# Patient Record
Sex: Male | Born: 1935 | Race: Black or African American | Hispanic: No | Marital: Married | Smoking: Former smoker
Health system: Southern US, Community
[De-identification: ages and names within clinical notes are randomized; demographics above are authoritative.]

## PROBLEM LIST (undated history)

## (undated) ENCOUNTER — Emergency Department: Admission: EM | Payer: PPO | Source: Home / Self Care

## (undated) DIAGNOSIS — I440 Atrioventricular block, first degree: Secondary | ICD-10-CM

## (undated) DIAGNOSIS — E785 Hyperlipidemia, unspecified: Secondary | ICD-10-CM

## (undated) DIAGNOSIS — R001 Bradycardia, unspecified: Secondary | ICD-10-CM

## (undated) DIAGNOSIS — I2510 Atherosclerotic heart disease of native coronary artery without angina pectoris: Secondary | ICD-10-CM

## (undated) DIAGNOSIS — I4891 Unspecified atrial fibrillation: Secondary | ICD-10-CM

## (undated) DIAGNOSIS — R42 Dizziness and giddiness: Secondary | ICD-10-CM

## (undated) DIAGNOSIS — I10 Essential (primary) hypertension: Secondary | ICD-10-CM

## (undated) HISTORY — DX: Hyperlipidemia, unspecified: E78.5

## (undated) HISTORY — DX: Bradycardia, unspecified: R00.1

## (undated) HISTORY — DX: Unspecified atrial fibrillation: I48.91

## (undated) HISTORY — DX: Essential (primary) hypertension: I10

## (undated) HISTORY — DX: Dizziness and giddiness: R42

## (undated) HISTORY — DX: Atherosclerotic heart disease of native coronary artery without angina pectoris: I25.10

## (undated) HISTORY — DX: Atrioventricular block, first degree: I44.0

---

## 2003-01-06 HISTORY — PX: SHOULDER SURGERY: SHX246

## 2003-11-20 ENCOUNTER — Emergency Department: Payer: Self-pay | Admitting: Emergency Medicine

## 2004-03-31 ENCOUNTER — Ambulatory Visit: Payer: Self-pay | Admitting: Orthopaedic Surgery

## 2004-04-02 ENCOUNTER — Ambulatory Visit: Payer: Self-pay | Admitting: Orthopaedic Surgery

## 2004-04-04 ENCOUNTER — Ambulatory Visit: Payer: Self-pay | Admitting: Orthopaedic Surgery

## 2005-05-14 ENCOUNTER — Ambulatory Visit: Payer: Self-pay | Admitting: Internal Medicine

## 2005-05-29 ENCOUNTER — Ambulatory Visit: Payer: Self-pay | Admitting: Internal Medicine

## 2005-07-06 ENCOUNTER — Ambulatory Visit: Payer: Self-pay | Admitting: Cardiology

## 2006-01-05 HISTORY — PX: CHOLECYSTECTOMY: SHX55

## 2009-12-26 ENCOUNTER — Emergency Department: Payer: Self-pay

## 2012-05-12 DIAGNOSIS — R001 Bradycardia, unspecified: Secondary | ICD-10-CM

## 2012-05-12 DIAGNOSIS — R42 Dizziness and giddiness: Secondary | ICD-10-CM

## 2012-05-12 DIAGNOSIS — I10 Essential (primary) hypertension: Secondary | ICD-10-CM

## 2012-05-12 DIAGNOSIS — Z9049 Acquired absence of other specified parts of digestive tract: Secondary | ICD-10-CM | POA: Insufficient documentation

## 2012-05-12 DIAGNOSIS — E785 Hyperlipidemia, unspecified: Secondary | ICD-10-CM | POA: Insufficient documentation

## 2012-05-12 HISTORY — DX: Bradycardia, unspecified: R00.1

## 2012-05-12 HISTORY — DX: Dizziness and giddiness: R42

## 2012-05-12 HISTORY — DX: Essential (primary) hypertension: I10

## 2012-05-13 DIAGNOSIS — I440 Atrioventricular block, first degree: Secondary | ICD-10-CM

## 2012-05-13 HISTORY — DX: Atrioventricular block, first degree: I44.0

## 2012-05-24 ENCOUNTER — Ambulatory Visit: Payer: Self-pay | Admitting: Internal Medicine

## 2013-06-20 ENCOUNTER — Ambulatory Visit: Payer: Self-pay | Admitting: Orthopedic Surgery

## 2014-11-12 ENCOUNTER — Encounter: Payer: Self-pay | Admitting: *Deleted

## 2014-11-12 DIAGNOSIS — I2510 Atherosclerotic heart disease of native coronary artery without angina pectoris: Secondary | ICD-10-CM

## 2014-11-12 HISTORY — DX: Atherosclerotic heart disease of native coronary artery without angina pectoris: I25.10

## 2014-11-13 ENCOUNTER — Encounter: Payer: Self-pay | Admitting: Obstetrics and Gynecology

## 2014-11-13 ENCOUNTER — Ambulatory Visit (INDEPENDENT_AMBULATORY_CARE_PROVIDER_SITE_OTHER): Payer: Medicare Other | Admitting: Obstetrics and Gynecology

## 2014-11-13 VITALS — BP 163/80 | HR 74 | Resp 16 | Ht 72.0 in | Wt 246.9 lb

## 2014-11-13 DIAGNOSIS — R310 Gross hematuria: Secondary | ICD-10-CM | POA: Diagnosis not present

## 2014-11-13 LAB — URINALYSIS, COMPLETE
BILIRUBIN UA: NEGATIVE
GLUCOSE, UA: NEGATIVE
Ketones, UA: NEGATIVE
LEUKOCYTES UA: NEGATIVE
Nitrite, UA: NEGATIVE
Specific Gravity, UA: 1.025 (ref 1.005–1.030)
Urobilinogen, Ur: 0.2 mg/dL (ref 0.2–1.0)
pH, UA: 5.5 (ref 5.0–7.5)

## 2014-11-13 LAB — MICROSCOPIC EXAMINATION: Epithelial Cells (non renal): NONE SEEN /hpf (ref 0–10)

## 2014-11-13 NOTE — Progress Notes (Signed)
11/13/2014 1:43 PM   Jacob Avila 1927/04/12 [id number]  Referring provider: No referring provider defined for this encounter.  Chief Complaint  Patient presents with  . Hematuria  . Establish Care    HPI: Patient is an 81yo male presenting today as a referral for gross hematuria occuring 2 weeks ago.  He was seen by his PCP who noted TNTC RBCs on UA with negative urine culture.  He denies any pain, fevers, chills, or change in urinary symptoms. No penile or scrotal pain. No histyr of renal stones.  Former smoker quit in 1980s 2ppd x 40 years.    PMH: Past Medical History  Diagnosis Date  . HLD (hyperlipidemia)     Surgical History: Past Surgical History  Procedure Laterality Date  . Cholecystectomy  2008  . Shoulder surgery  2005    Home Medications:    Medication List       This list is accurate as of: 11/13/14  1:43 PM.  Always use your most recent med list.               aspirin EC 81 MG tablet  Take 81 mg by mouth.     simvastatin 40 MG tablet  Commonly known as:  ZOCOR  Take 40 mg by mouth.        Allergies: No Known Allergies  Family History: History reviewed. No pertinent family history.  Social History:  reports that he has quit smoking. His smoking use included Cigarettes. He does not have any smokeless tobacco history on file. His alcohol and drug histories are not on file.  ROS: UROLOGY Frequent Urination?: No Hard to postpone urination?: No Burning/pain with urination?: No Get up at night to urinate?: No Leakage of urine?: No Urine stream starts and stops?: No Trouble starting stream?: No Do you have to strain to urinate?: No Blood in urine?: Yes Urinary tract infection?: No Sexually transmitted disease?: No Injury to kidneys or bladder?: No Painful intercourse?: No Weak stream?: No Erection problems?: No Penile pain?: No  Gastrointestinal Nausea?: No Vomiting?: No Indigestion/heartburn?: No Diarrhea?:  No Constipation?: No  Constitutional Fever: No Night sweats?: No Weight loss?: No Fatigue?: No  Skin Skin rash/lesions?: No Itching?: No  Eyes Blurred vision?: No Double vision?: No  Ears/Nose/Throat Sore throat?: No Sinus problems?: No  Hematologic/Lymphatic Swollen glands?: No Easy bruising?: No  Cardiovascular Leg swelling?: No Chest pain?: No  Respiratory Cough?: No Shortness of breath?: No  Endocrine Excessive thirst?: No  Musculoskeletal Back pain?: No Joint pain?: No  Neurological Headaches?: No Dizziness?: No  Psychologic Depression?: No Anxiety?: No  Physical Exam: BP 163/80 mmHg  Pulse 74  Resp 16  Ht 6' (1.829 m)  Wt 246 lb 14.4 oz (111.993 kg)  BMI 33.48 kg/m2  Constitutional:  Alert and oriented, No acute distress. HEENT: [place] AT, moist mucus membranes.  Trachea midline, no masses. Cardiovascular: No clubbing, cyanosis, or edema. Respiratory: Normal respiratory effort, no increased work of breathing. GI: Abdomen is soft, nontender, nondistended, no abdominal masses GU: No CVA tenderness.  Uncircumcised pahllus, testicles descended bilaterally, nontender, small bilateral hydrocele Skin: No rashes, bruises or suspicious lesions. Lymph: No cervical or inguinal adenopathy. Neurologic: Grossly intact, no focal deficits, moving all 4 extremities. Psychiatric: Normal mood and affect.  Laboratory Data:   Urinalysis    Component Value Date/Time   GLUCOSEU Negative 11/13/2014 1128   BILIRUBINUR Negative 11/13/2014 1128   NITRITE Negative 11/13/2014 1128   LEUKOCYTESUR Negative 11/13/2014 1128  Pertinent Imaging:   Assessment & Plan:  81yo male with asymptomatic gross hematuria.  Remote heavy smoking history.  1. Gross hematuria- We discussed the differential diagnosis for  hematuria including nephrolithiasis, renal or upper tract tumors, bladder stones, UTIs, or bladder tumors as well as undetermined etiologies. Per [facility]  guidelines, I did recommend complete hematuria evaluation including CTU, possible urine cytology, and office cystoscopy. -CMP - Urinalysis, Complete - CULTURE, URINE COMPREHENSIVE   Return for CT Urogram results; cystoscopy .  These notes generated with voice recognition software. I apologize for typographical errors.  Herbert Moors, [facility] [address] [place], [place] [address] [phone]

## 2014-11-14 LAB — COMPREHENSIVE METABOLIC PANEL
A/G RATIO: 1.2 (ref 1.1–2.5)
ALBUMIN: 4.2 g/dL (ref 3.5–4.7)
ALT: 7 IU/L (ref 0–44)
AST: 19 IU/L (ref 0–40)
Alkaline Phosphatase: 101 IU/L (ref 39–117)
BUN/Creatinine Ratio: 18 (ref 10–22)
BUN: 17 mg/dL (ref 8–27)
Bilirubin Total: 0.3 mg/dL (ref 0.0–1.2)
CALCIUM: 9.5 mg/dL (ref 8.6–10.2)
CO2: 18 mmol/L (ref 18–29)
Chloride: 103 mmol/L (ref 97–106)
Creatinine, Ser: 0.92 mg/dL (ref 0.76–1.27)
GFR calc Af Amer: 88 mL/min/{1.73_m2} (ref >59)
GFR calc non Af Amer: 76 mL/min/{1.73_m2} (ref >59)
GLOBULIN, TOTAL: 3.4 g/dL (ref 1.5–4.5)
Glucose: 93 mg/dL (ref 65–99)
POTASSIUM: 4.7 mmol/L (ref 3.5–5.2)
Sodium: 144 mmol/L (ref 136–144)
Total Protein: 7.6 g/dL (ref 6.0–8.5)

## 2014-11-15 LAB — CULTURE, URINE COMPREHENSIVE

## 2014-11-20 ENCOUNTER — Ambulatory Visit
Admission: RE | Admit: 2014-11-20 | Discharge: 2014-11-20 | Disposition: A | Discharge status: Home / Self Care | Payer: Medicare Other | Source: Ambulatory Visit | Attending: Obstetrics and Gynecology | Admitting: Obstetrics and Gynecology

## 2014-11-20 DIAGNOSIS — D3502 Benign neoplasm of left adrenal gland: Secondary | ICD-10-CM | POA: Diagnosis not present

## 2014-11-20 DIAGNOSIS — R310 Gross hematuria: Secondary | ICD-10-CM

## 2014-11-20 DIAGNOSIS — N281 Cyst of kidney, acquired: Secondary | ICD-10-CM | POA: Diagnosis not present

## 2014-11-20 DIAGNOSIS — N400 Enlarged prostate without lower urinary tract symptoms: Secondary | ICD-10-CM | POA: Insufficient documentation

## 2014-11-20 MED ORDER — IOHEXOL 300 MG/ML  SOLN
125.0000 mL | Freq: Once | INTRAMUSCULAR | Status: AC | PRN
  Administered 2014-11-20: 125 mL via INTRAVENOUS

## 2014-11-21 ENCOUNTER — Ambulatory Visit (INDEPENDENT_AMBULATORY_CARE_PROVIDER_SITE_OTHER): Payer: Medicare Other | Admitting: Urology

## 2014-11-21 ENCOUNTER — Encounter: Payer: Self-pay | Admitting: Urology

## 2014-11-21 VITALS — BP 160/101 | HR 76 | Ht 72.0 in | Wt 245.9 lb

## 2014-11-21 DIAGNOSIS — R310 Gross hematuria: Secondary | ICD-10-CM | POA: Diagnosis not present

## 2014-11-21 DIAGNOSIS — Q6100 Congenital renal cyst, unspecified: Secondary | ICD-10-CM

## 2014-11-21 DIAGNOSIS — R339 Retention of urine, unspecified: Secondary | ICD-10-CM | POA: Diagnosis not present

## 2014-11-21 DIAGNOSIS — D3502 Benign neoplasm of left adrenal gland: Secondary | ICD-10-CM

## 2014-11-21 DIAGNOSIS — N401 Enlarged prostate with lower urinary tract symptoms: Secondary | ICD-10-CM

## 2014-11-21 DIAGNOSIS — N281 Cyst of kidney, acquired: Secondary | ICD-10-CM

## 2014-11-21 LAB — URINALYSIS, COMPLETE
BILIRUBIN UA: NEGATIVE
GLUCOSE, UA: NEGATIVE
KETONES UA: NEGATIVE
Leukocytes, UA: NEGATIVE
NITRITE UA: NEGATIVE
SPEC GRAV UA: 1.025 (ref 1.005–1.030)
UUROB: 0.2 mg/dL (ref 0.2–1.0)
pH, UA: 5.5 (ref 5.0–7.5)

## 2014-11-21 LAB — MICROSCOPIC EXAMINATION: Epithelial Cells (non renal): NONE SEEN /hpf (ref 0–10)

## 2014-11-21 LAB — BLADDER SCAN AMB NON-IMAGING: SCAN RESULT: 187

## 2014-11-21 MED ORDER — LIDOCAINE HCL 2 % EX GEL
1.0000 "application " | Freq: Once | CUTANEOUS | Status: AC
  Administered 2014-11-21: 1 via URETHRAL

## 2014-11-21 MED ORDER — CIPROFLOXACIN HCL 500 MG PO TABS
500.0000 mg | ORAL_TABLET | Freq: Once | ORAL | Status: AC
  Administered 2014-11-21: 500 mg via ORAL

## 2014-11-21 NOTE — Progress Notes (Signed)
4:18 PM  11/21/2014    Jacob Avila June 18, 1927 [id number]  Referring provider: Albina Billet, MD [address]   [place], [place] [address]  Chief Complaint  Patient presents with  . Cysto    gross hematuria    HPI: Patient is an 81yo male presenting today as a referral for gross hematuria occuring 2 weeks ago.  He was seen by his PCP who noted TNTC RBCs on UA with negative urine culture.  He denies any pain, fevers, chills, or change in urinary symptoms. No penile or scrotal pain. No history of renal stones.   He has minimal voiding complaints and denies urinary urgency, frequency, nocturia, or incontinence.   He does report that on occasion , he does have to void a second time to fully empty his bladder.  Former smoker quit in 1980s 2ppd x 40 years.   He returns to the office today to discuss his CT urogram results of her office cystoscopy.    CT urogram performed yesterday shows bilateral simple renal cysts, small right hyperdense cyst , an incidental left adrenal adenoma as well as massively enlarged prostate. There is no other obvious pathology.  PMH: Past Medical History  Diagnosis Date  . HLD (hyperlipidemia)   . Arteriosclerosis of coronary artery 11/12/2014  . 1St degree AV block 05/13/2012  . BP (high blood pressure) 05/12/2012  . Bradycardia 05/12/2012  . Dizziness 05/12/2012    Surgical History: Past Surgical History  Procedure Laterality Date  . Cholecystectomy  2008  . Shoulder surgery  2005    Home Medications:    Medication List       This list is accurate as of: 11/21/14  4:18 PM.  Always use your most recent med list.               aspirin EC 81 MG tablet  Take 81 mg by mouth.     simvastatin 40 MG tablet  Commonly known as:  ZOCOR  Take 40 mg by mouth.        Allergies: No Known Allergies  Family History: History reviewed. No pertinent family history.  Social History:  reports that he quit smoking about 36 years ago. His  smoking use included Cigarettes. He does not have any smokeless tobacco history on file. He reports that he does not drink alcohol or use illicit drugs.  Physical Exam: BP 160/101 mmHg  Pulse 76  Ht 6' (1.829 m)  Wt 245 lb 14.4 oz (111.54 kg)  BMI 33.34 kg/m2  Constitutional:  Alert and oriented, No acute distress.   Appears younger than stated age. HEENT: [place] AT, moist mucus membranes.  Trachea midline, no masses. Cardiovascular: No clubbing, cyanosis, or edema. Respiratory: Normal respiratory effort, no increased work of breathing. GI: Abdomen is soft, nontender, nondistended, no abdominal masses GU:  Uncircumcised phallus with pain and urethral meatus. Skin: No rashes, bruises or suspicious lesions. Neurologic: Grossly intact, no focal deficits, moving all 4 extremities. Psychiatric: Normal mood and affect.  Laboratory Data: Urinalysis UA negative today for any   Pertinent Imaging: CLINICAL DATA: Gross hematuria for 1 month.  EXAM: CT ABDOMEN AND PELVIS WITHOUT AND WITH CONTRAST  TECHNIQUE: Multidetector CT imaging of the abdomen and pelvis was performed following the standard protocol before and following the bolus administration of intravenous contrast.  CONTRAST: 142mL OMNIPAQUE IOHEXOL 300 MG/ML SOLN  COMPARISON: 12/26/2009  FINDINGS: Lower chest: Lung bases are clear.  Hepatobiliary: Several low-density lesions in the liver simple fluid  attenuation consists with benign hepatic cysts. Post cholecystectomy.  Pancreas: Pancreas is normal. No ductal dilatation. No pancreatic inflammation.  Spleen: Normal spleen  Adrenals/urinary tract: 18 mm nodule of the LEFT adrenal gland has low attenuation consistent with a benign adenoma. RIGHT adrenal glands normal.  Non IV contrast images demonstrate no nephrolithiasis or ureterolithiasis. Cortical phase imaging demonstrates bilateral nonenhancing low-density renal cysts. One small 7 mm  high-density lesion in the lower pole of the RIGHT kidney (image 44, series 2). Delayed pyelogram phase imaging demonstrates no filling defects within the collecting systems or ureters.  Stomach/Bowel: Stomach, small bowel, appendix, and cecum are normal. The colon and rectosigmoid colon are normal.  Vascular/Lymphatic: Abdominal aorta is normal caliber with atherosclerotic calcification. There is no retroperitoneal or periportal lymphadenopathy. No pelvic lymphadenopathy.  Reproductive: Prostate gland is enlarged measuring 81 mm by 69 in axial dimension and 93 mm in craniocaudad dimension.  Other: No free fluid.  Musculoskeletal: No aggressive osseous lesion.  IMPRESSION: 1. No explanation for hematuria. No nephrolithiasis, ureterolithiasis, enhancing renal cortical lesion, or filling defects within the collecting systems. 2. Bilateral Bosniak 1 renal cysts. 3. Small hyperdense lesion in the RIGHT kidney is too small to characterize but likely represents a hemorrhagic cyst. 4. No bladder stones or filling defects in the bladder which does not excluded a bladder lesion. 5. Incidental benign LEFT adrenal adenoma. 6. Marked prostate hypertrophy.   Electronically Signed  By: Suzy Bouchard M.D.  On: 11/20/2014 09:18   Cystoscopy Procedure Note  Patient identification was confirmed, informed consent was obtained, and patient was prepped using Betadine solution.  Lidocaine jelly was administered per urethral meatus.    Preoperative abx where received prior to procedure.     Pre-Procedure: - Inspection reveals a normal caliber ureteral meatus.  Procedure: The flexible cystoscope was introduced without difficulty - No urethral strictures/lesions are present. - Enlarged prostate  With trilobar coaptation and irregular contour ( left lateral lobe greater than right), ~8 cm length., friable mucosa - Elevated bladder neck - Bilateral ureteral orifices  identified - Bladder mucosa  reveals no ulcers, tumors, or lesions - No bladder stones - Mild trabeculation  Retroflexion shows massive intravesical protrusion of prostate with mass effect into the bladder, ball valve appearance   Post-Procedure: - Patient tolerated the procedure well  PVR today 180 cc   Assessment & Plan:  81yo male with asymptomatic gross hematuria, remote heavy smoking history.   CT  Urogram has several incidental findings of minimal significance but does show a massively enlarged prostate gland. Cystoscopy today confirms this but no evidence of bladder tumors.  Relatively few voiding symptoms but does have elevated PVR today.    1. Gross hematuria As above - Urinalysis, Complete - ciprofloxacin (CIPRO) tablet 500 mg; Take 1 tablet (500 mg total) by mouth once. - lidocaine (XYLOCAINE) 2 % jelly 1 application; Place 1 application into the urethra once.  2. Adrenal adenoma, left Incidental   3. Renal cyst Simple renal cyst and small indeterminate hy  4. Enlarged prostate with lower urinary tract symptoms (LUTS) Asymptomatic.  See below.  5. Incomplete bladder emptying  Slightly elevated PVR to 180 today but no sequela including no history of urinary tract infections, renal failure, or bladder stones. As such, we'll continue to monitor his symptoms and postvoid residual.  Return in about 3 months (around 02/21/2015) for PVR, IPSS .   Hollice Espy, MD  [facility] [address], [place], [place] [address] [phone]

## 2014-11-21 NOTE — Progress Notes (Signed)
Bladder Scan Patient  Void: 173ml Performed By: Larna Daughters

## 2014-11-27 ENCOUNTER — Other Ambulatory Visit: Payer: Medicare Other

## 2015-02-22 ENCOUNTER — Encounter: Payer: Self-pay | Admitting: Urology

## 2015-02-22 ENCOUNTER — Ambulatory Visit (INDEPENDENT_AMBULATORY_CARE_PROVIDER_SITE_OTHER): Payer: Medicare Other | Admitting: Urology

## 2015-02-22 VITALS — BP 197/81 | HR 67 | Ht 72.0 in | Wt 246.6 lb

## 2015-02-22 DIAGNOSIS — R310 Gross hematuria: Secondary | ICD-10-CM | POA: Diagnosis not present

## 2015-02-22 DIAGNOSIS — R339 Retention of urine, unspecified: Secondary | ICD-10-CM

## 2015-02-22 DIAGNOSIS — N400 Enlarged prostate without lower urinary tract symptoms: Secondary | ICD-10-CM | POA: Diagnosis not present

## 2015-02-22 LAB — MICROSCOPIC EXAMINATION: EPITHELIAL CELLS (NON RENAL): NONE SEEN /HPF (ref 0–10)

## 2015-02-22 LAB — URINALYSIS, COMPLETE
Bilirubin, UA: NEGATIVE
GLUCOSE, UA: NEGATIVE
Ketones, UA: NEGATIVE
Leukocytes, UA: NEGATIVE
NITRITE UA: NEGATIVE
PH UA: 6 (ref 5.0–7.5)
RBC, UA: NEGATIVE
Specific Gravity, UA: 1.025 (ref 1.005–1.030)
UUROB: 1 mg/dL (ref 0.2–1.0)

## 2015-02-22 LAB — BLADDER SCAN AMB NON-IMAGING

## 2015-02-22 NOTE — Progress Notes (Signed)
11:39 AM  02/22/2015    Jacob Avila 09/27/1927 [id number]  Referring provider: Albina Billet, MD [address]   [place], [place] [address]  Chief Complaint  Patient presents with  . Benign Prostatic Hypertrophy    51months    HPI: 82 yo M male who returns today for routine follow up.  Hematuria No further epidoses.   S/ p work up including CT urogram 11/2014 hows bilateral simple renal cysts, small right hyperdense cyst , an incidental left adrenal adenoma as well as massively enlarged prostate.  Follow up ysto showed bilobar coaptation and irregular contour ( left lateral lobe greater than right), ~8 cm length., friable mucosa and median lobe.    Former smoker quit in 1980s 2ppd x 40 years.   Massive BPH/ incomplete bladder emtpying Massively enlarged prostate on cystoscopy, median lobe History of incomplete bladder emptying, previous PVR 185 cc, today 83 cc No history of urinary retention/ stones/  Cr 0.92 No voiding complaints today, not on any BPH meds IPSS as below      IPSS      02/22/15 0900       International Prostate Symptom Score   How often have you had the sensation of not emptying your bladder? Less than 1 in 5     How often have you had to urinate less than every two hours? Less than half the time     How often have you found you stopped and started again several times when you urinated? Less than 1 in 5 times     How often have you found it difficult to postpone urination? Not at All     How often have you had a weak urinary stream? Less than 1 in 5 times     How often have you had to strain to start urination? Not at All     How many times did you typically get up at night to urinate? 1 Time     Total IPSS Score 6     Quality of Life due to urinary symptoms   If you were to spend the rest of your life with your urinary condition just the way it is now how would you feel about that? Pleased        Score:  1-7 Mild 8-19 Moderate 20-35  Severe    PMH: Past Medical History  Diagnosis Date  . HLD (hyperlipidemia)   . Arteriosclerosis of coronary artery 11/12/2014  . 1St degree AV block 05/13/2012  . BP (high blood pressure) 05/12/2012  . Bradycardia 05/12/2012  . Dizziness 05/12/2012    Surgical History: Past Surgical History  Procedure Laterality Date  . Cholecystectomy  2008  . Shoulder surgery  2005    Home Medications:    Medication List       This list is accurate as of: 02/22/15 11:39 AM.  Always use your most recent med list.               aspirin EC 81 MG tablet  Take 81 mg by mouth.     simvastatin 40 MG tablet  Commonly known as:  ZOCOR  Take 40 mg by mouth.        Allergies: No Known Allergies  Family History: History reviewed. No pertinent family history.  Social History:  reports that he quit smoking about 37 years ago. His smoking use included Cigarettes. He does not have any smokeless tobacco history on file. He reports that he  does not drink alcohol or use illicit drugs.  Physical Exam: BP 197/81 mmHg  Pulse 67  Ht 6' (1.829 m)  Wt 246 lb 9.6 oz (111.857 kg)  BMI 33.44 kg/m2  Constitutional:  Alert and oriented, No acute distress.   Appears younger than stated age. HEENT: [place] AT, moist mucus membranes.  Trachea midline, no masses. Cardiovascular: No clubbing, cyanosis, or edema. Respiratory: Normal respiratory effort, no increased work of breathing. GI: Abdomen is soft, nontender, nondistended, no abdominal masses.  No suprapubic tenderness. Neurologic: Grossly intact, no focal deficits, moving all 4 extremities. Psychiatric: Normal mood and affect.  Laboratory Data:   Lab Results  Component Value Date   CREATININE 0.92 11/13/2014    Urinalysis UA negative today, see epic  Results for orders placed or performed in visit on 02/22/15  BLADDER SCAN AMB NON-IMAGING  Result Value Ref Range   Scan Result 41ml     Assessment & Plan:    1. Gross hematuria Resolved, work  up consistent with massive BPH, friable mucosa - Urinalysis, Complete  2. BPH (benign prostatic hyperplasia) Asymptomatic on no meds No sequela PVR today improved, Cr normal Risk for retention, offered finasteride but not interested Will continue to monitor - BLADDER SCAN AMB NON-IMAGING  3. Incomplete bladder emptying As above   Return in about 1 year (around 02/22/2016) for PVR.   Hollice Espy, MD  [facility] [address], [place], [place] [address] [phone]

## 2016-02-25 ENCOUNTER — Ambulatory Visit: Payer: Medicare Other | Admitting: Urology

## 2016-02-25 NOTE — Progress Notes (Deleted)
9:31 AM  02/25/16    Kearney BRIGGSTON STIGGERS 1927/05/12 [id number]  Referring provider: Albina Billet, MD [address]   [place], [place] [address]  No chief complaint on file.   HPI: 83 yo M male who returns today for yearly follow up.  Hematuria No further epidoses.  S/ p work up including CT urogram 11/2014 hows bilateral simple renal cysts, small right hyperdense cyst , an incidental left adrenal adenoma as well as massively enlarged prostate.  Follow up ysto showed bilobar coaptation and irregular contour ( left lateral lobe greater than right), ~8 cm length., friable mucosa and median lobe.    Former smoker quit in 1980s 2ppd x 40 years.   Massive BPH/ incomplete bladder emtpying Massively enlarged prostate on cystoscopy, median lobe History of incomplete bladder emptying, previous PVR 83 cc, today *** cc No history of urinary retention/ stones/  Cr 0.92 No voiding complaints today, not on any BPH meds IPSS as below    Score:  1-7 Mild 8-19 Moderate 20-35 Severe    PMH: Past Medical History:  Diagnosis Date  . 1St degree AV block 05/13/2012  . Arteriosclerosis of coronary artery 11/12/2014  . BP (high blood pressure) 05/12/2012  . Bradycardia 05/12/2012  . Dizziness 05/12/2012  . HLD (hyperlipidemia)     Surgical History: Past Surgical History:  Procedure Laterality Date  . CHOLECYSTECTOMY  2008  . SHOULDER SURGERY  2005    Home Medications:  Allergies as of 02/25/2016   No Known Allergies     Medication List       Accurate as of 02/25/16  9:31 AM. Always use your most recent med list.          aspirin EC 81 MG tablet Take 81 mg by mouth.   simvastatin 40 MG tablet Commonly known as:  ZOCOR Take 40 mg by mouth.       Allergies: No Known Allergies  Family History: No family history on file.  Social History:  reports that he quit smoking about 38 years ago. His smoking use included Cigarettes. He does not have any smokeless tobacco history  on file. He reports that he does not drink alcohol or use drugs.  Physical Exam: There were no vitals taken for this visit.  Constitutional:  Alert and oriented, No acute distress.   Appears younger than stated age. HEENT: [place] AT, moist mucus membranes.  Trachea midline, no masses. Cardiovascular: No clubbing, cyanosis, or edema. Respiratory: Normal respiratory effort, no increased work of breathing. GI: Abdomen is soft, nontender, nondistended, no abdominal masses.  No suprapubic tenderness. Neurologic: Grossly intact, no focal deficits, moving all 4 extremities. Psychiatric: Normal mood and affect.  Laboratory Data:   Lab Results  Component Value Date   CREATININE 0.92 11/13/2014    Urinalysis UA negative today, see epic  Results for orders placed or performed in visit on 02/22/15  Microscopic Examination  Result Value Ref Range   WBC, UA 0-5 0 - 5 /hpf   RBC, UA 0-2 0 - 2 /hpf   Epithelial Cells (non renal) None seen 0 - 10 /hpf   Mucus, UA Present (A) Not Estab.   Bacteria, UA Few (A) None seen/Few  Urinalysis, Complete  Result Value Ref Range   Specific Gravity, UA 1.025 1.005 - 1.030   pH, UA 6.0 5.0 - 7.5   Color, UA Yellow Yellow   Appearance Ur Clear Clear   Leukocytes, UA Negative Negative   Protein, UA 2+ (A)  Negative/Trace   Glucose, UA Negative Negative   Ketones, UA Negative Negative   RBC, UA Negative Negative   Bilirubin, UA Negative Negative   Urobilinogen, Ur 1.0 0.2 - 1.0 mg/dL   Nitrite, UA Negative Negative   Microscopic Examination See below:   BLADDER SCAN AMB NON-IMAGING  Result Value Ref Range   Scan Result 28ml     Assessment & Plan:    1. Gross hematuria Resolved, work up consistent with massive BPH, friable mucosa - Urinalysis, Complete  2. BPH (benign prostatic hyperplasia) Asymptomatic on no meds No sequela PVR today improved, Cr normal Risk for retention, offered finasteride but not interested Will continue to monitor -  BLADDER SCAN AMB NON-IMAGING  3. Incomplete bladder emptying As above   No Follow-up on file.   Zara Council, [facility] [address], [place], [place] [address] [phone]

## 2016-10-23 DIAGNOSIS — R0609 Other forms of dyspnea: Secondary | ICD-10-CM

## 2016-11-03 DIAGNOSIS — I509 Heart failure, unspecified: Secondary | ICD-10-CM | POA: Insufficient documentation

## 2016-11-30 ENCOUNTER — Encounter: Payer: Medicare Other | Attending: Cardiovascular Disease | Admitting: *Deleted

## 2016-11-30 ENCOUNTER — Encounter: Payer: Self-pay | Admitting: *Deleted

## 2016-11-30 VITALS — Ht 72.5 in | Wt 221.0 lb

## 2016-11-30 DIAGNOSIS — I4891 Unspecified atrial fibrillation: Secondary | ICD-10-CM

## 2016-11-30 DIAGNOSIS — I2510 Atherosclerotic heart disease of native coronary artery without angina pectoris: Secondary | ICD-10-CM | POA: Diagnosis not present

## 2016-11-30 DIAGNOSIS — Z7982 Long term (current) use of aspirin: Secondary | ICD-10-CM | POA: Insufficient documentation

## 2016-11-30 DIAGNOSIS — Z79899 Other long term (current) drug therapy: Secondary | ICD-10-CM | POA: Insufficient documentation

## 2016-11-30 DIAGNOSIS — I440 Atrioventricular block, first degree: Secondary | ICD-10-CM | POA: Diagnosis not present

## 2016-11-30 DIAGNOSIS — Z7902 Long term (current) use of antithrombotics/antiplatelets: Secondary | ICD-10-CM | POA: Insufficient documentation

## 2016-11-30 DIAGNOSIS — E785 Hyperlipidemia, unspecified: Secondary | ICD-10-CM | POA: Insufficient documentation

## 2016-11-30 DIAGNOSIS — I10 Essential (primary) hypertension: Secondary | ICD-10-CM | POA: Insufficient documentation

## 2016-11-30 DIAGNOSIS — Z87891 Personal history of nicotine dependence: Secondary | ICD-10-CM | POA: Insufficient documentation

## 2016-11-30 DIAGNOSIS — Z955 Presence of coronary angioplasty implant and graft: Secondary | ICD-10-CM | POA: Diagnosis not present

## 2016-11-30 HISTORY — DX: Unspecified atrial fibrillation: I48.91

## 2016-11-30 NOTE — Progress Notes (Signed)
Cardiac Individual Treatment Plan  Patient Details  Name: Jacob Avila MRN: [id number] Date of Birth: March 10, 1927 Referring Provider:     Cardiac Rehab from 11/30/2016 in [facility] Cardiac and Pulmonary Rehab  Referring Provider  Mora Appl MD      Initial Encounter Date:    Cardiac Rehab from 11/30/2016 in [facility] Cardiac and Pulmonary Rehab  Date  11/30/16  Referring Provider  Mora Appl MD      Visit Diagnosis: Status post coronary artery stent placement  Patient's Home Medications on Admission:  Current Outpatient Medications:  .  atorvastatin (LIPITOR) 80 MG tablet, Take 80 mg by mouth., Disp: , Rfl:  .  clopidogrel (PLAVIX) 75 MG tablet, Take 75 mg by mouth., Disp: , Rfl:  .  furosemide (LASIX) 20 MG tablet, Take 40 mg by mouth., Disp: , Rfl:  .  lisinopril (PRINIVIL,ZESTRIL) 2.5 MG tablet, Take 2.5 mg by mouth., Disp: , Rfl:  .  metoprolol succinate (TOPROL-XL) 50 MG 24 hr tablet, Take 50 mg by mouth., Disp: , Rfl:  .  omeprazole (PRILOSEC) 20 MG capsule, Take 20 mg by mouth., Disp: , Rfl:  .  aspirin EC 81 MG tablet, Take 81 mg by mouth., Disp: , Rfl:  .  simvastatin (ZOCOR) 40 MG tablet, Take 40 mg by mouth., Disp: , Rfl:   Past Medical History: Past Medical History:  Diagnosis Date  . 1st degree AV block 05/13/2012  . Arteriosclerosis of coronary artery 11/12/2014  . Atrial fibrillation ([place]) 11/30/2016  . BP (high blood pressure) 05/12/2012  . Bradycardia 05/12/2012  . Dizziness 05/12/2012  . HLD (hyperlipidemia)     Tobacco Use: Social History   Tobacco Use  Smoking Status Former Smoker  . Types: Cigarettes  . Last attempt to quit: 01/05/1978  . Years since quitting: 38.9  Smokeless Tobacco Never Used    Labs: Recent Review Flowsheet Data    There is no flowsheet data to display.       Exercise Target Goals: Date: 11/30/16  Exercise Program Goal: Individual exercise prescription set with THRR, safety & activity barriers. Participant  demonstrates ability to understand and report RPE using BORG scale, to self-measure pulse accurately, and to acknowledge the importance of the exercise prescription.  Exercise Prescription Goal: Starting with aerobic activity 30 plus minutes a day, 3 days per week for initial exercise prescription. Provide home exercise prescription and guidelines that participant acknowledges understanding prior to discharge.  Activity Barriers & Risk Stratification: Activity Barriers & Cardiac Risk Stratification - 11/30/16 1413      Activity Barriers & Cardiac Risk Stratification   Activity Barriers  Shortness of Breath;Deconditioning;Muscular Weakness;Balance Concerns    Cardiac Risk Stratification  Moderate       6 Minute Walk: 6 Minute Walk    Row Name 11/30/16 1452         6 Minute Walk   Phase  Initial     Distance  1265 feet     Walk Time  6 minutes     # of Rest Breaks  0     MPH  2.4     METS  2.74     RPE  13     Perceived Dyspnea   1     VO2 Peak  9.6     Symptoms  Yes (comment)     Comments  SOB     Resting HR  109 bpm     Resting BP  116/64     Resting Oxygen  Saturation   99 %     Exercise Oxygen Saturation  during 6 min walk  97 %     Max Ex. HR  152 bpm     Max Ex. BP  160/74     2 Minute Post BP  124/64        Oxygen Initial Assessment:   Oxygen Re-Evaluation:   Oxygen Discharge (Final Oxygen Re-Evaluation):   Initial Exercise Prescription: Initial Exercise Prescription - 11/30/16 1400      Date of Initial Exercise RX and Referring Provider   Date  11/30/16    Referring Provider  Mora Appl MD      Treadmill   MPH  2.3    Grade  0    Minutes  15    METs  2.76      Recumbant Bike   Level  1    RPM  50    Minutes  15    METs  2.5      NuStep   Level  2    SPM  80    Minutes  15    METs  2.5      Prescription Details   Frequency (times per week)  3    Duration  Progress to 45 minutes of aerobic exercise without signs/symptoms of  physical distress      Intensity   THRR 40-80% of Max Heartrate  119-129    Ratings of Perceived Exertion  11-13    Perceived Dyspnea  0-4      Progression   Progression  Continue to progress workloads to maintain intensity without signs/symptoms of physical distress.      Resistance Training   Training Prescription  Yes    Weight  3 lbs    Reps  10-15       Perform Capillary Blood Glucose checks as needed.  Exercise Prescription Changes: Exercise Prescription Changes    Row Name 11/30/16 1400             Response to Exercise   Blood Pressure (Admit)  116/64       Blood Pressure (Exercise)  124/64       Blood Pressure (Exit)  112/64       Heart Rate (Admit)  109 bpm       Heart Rate (Exercise)  152 bpm       Heart Rate (Exit)  106 bpm       Oxygen Saturation (Admit)  99 %       Oxygen Saturation (Exercise)  97 %       Rating of Perceived Exertion (Exercise)  13       Perceived Dyspnea (Exercise)  1       Symptoms  SOB       Comments  walk test resutls          Exercise Comments:   Exercise Goals and Review: Exercise Goals    Row Name 11/30/16 1503             Exercise Goals   Increase Physical Activity  Yes       Intervention  Provide advice, education, support and counseling about physical activity/exercise needs.;Develop an individualized exercise prescription for aerobic and resistive training based on initial evaluation findings, risk stratification, comorbidities and participant's personal goals.       Expected Outcomes  Achievement of increased cardiorespiratory fitness and enhanced flexibility, muscular endurance and strength shown through measurements of functional capacity and personal statement of  participant.       Increase Strength and Stamina  Yes       Intervention  Provide advice, education, support and counseling about physical activity/exercise needs.;Develop an individualized exercise prescription for aerobic and resistive training based on  initial evaluation findings, risk stratification, comorbidities and participant's personal goals.       Expected Outcomes  Achievement of increased cardiorespiratory fitness and enhanced flexibility, muscular endurance and strength shown through measurements of functional capacity and personal statement of participant.       Able to understand and use rate of perceived exertion (RPE) scale  Yes       Intervention  Provide education and explanation on how to use RPE scale       Expected Outcomes  Short Term: Able to use RPE daily in rehab to express subjective intensity level;Long Term:  Able to use RPE to guide intensity level when exercising independently       Knowledge and understanding of Target Heart Rate Range (THRR)  Yes       Intervention  Provide education and explanation of THRR including how the numbers were predicted and where they are located for reference       Expected Outcomes  Short Term: Able to state/look up THRR;Long Term: Able to use THRR to govern intensity when exercising independently;Short Term: Able to use daily as guideline for intensity in rehab       Able to check pulse independently  Yes       Intervention  Provide education and demonstration on how to check pulse in carotid and radial arteries.;Review the importance of being able to check your own pulse for safety during independent exercise       Expected Outcomes  Short Term: Able to explain why pulse checking is important during independent exercise;Long Term: Able to check pulse independently and accurately       Understanding of Exercise Prescription  Yes       Intervention  Provide education, explanation, and written materials on patient's individual exercise prescription       Expected Outcomes  Short Term: Able to explain program exercise prescription;Long Term: Able to explain home exercise prescription to exercise independently          Exercise Goals Re-Evaluation :   Discharge Exercise Prescription  (Final Exercise Prescription Changes): Exercise Prescription Changes - 11/30/16 1400      Response to Exercise   Blood Pressure (Admit)  116/64    Blood Pressure (Exercise)  124/64    Blood Pressure (Exit)  112/64    Heart Rate (Admit)  109 bpm    Heart Rate (Exercise)  152 bpm    Heart Rate (Exit)  106 bpm    Oxygen Saturation (Admit)  99 %    Oxygen Saturation (Exercise)  97 %    Rating of Perceived Exertion (Exercise)  13    Perceived Dyspnea (Exercise)  1    Symptoms  SOB    Comments  walk test resutls       Nutrition:  Target Goals: Understanding of nutrition guidelines, daily intake of sodium 1500mg , cholesterol 200mg , calories 30% from fat and 7% or less from saturated fats, daily to have 5 or more servings of fruits and vegetables.  Biometrics: Pre Biometrics - 11/30/16 1504      Pre Biometrics   Height  6' 0.5" (1.842 m)    Weight  221 lb (100.2 kg)    Waist Circumference  39 inches  Hip Circumference  43 inches    Waist to Hip Ratio  0.91 %    BMI (Calculated)  29.54    Single Leg Stand  1.65 seconds        Nutrition Therapy Plan and Nutrition Goals: Nutrition Therapy & Goals - 11/30/16 1409      Intervention Plan   Intervention  Prescribe, educate and counsel regarding individualized specific dietary modifications aiming towards targeted core components such as weight, hypertension, lipid management, diabetes, heart failure and other comorbidities.;Nutrition handout(s) given to patient.    Expected Outcomes  Short Term Goal: Understand basic principles of dietary content, such as calories, fat, sodium, cholesterol and nutrients.;Short Term Goal: A plan has been developed with personal nutrition goals set during dietitian appointment.;Long Term Goal: Adherence to prescribed nutrition plan.       Nutrition Discharge: Rate Your Plate Scores: Nutrition Assessments - 11/30/16 1409      MEDFICTS Scores   Pre Score  6 Patient states he is not eating much b/c  he has no appetite.       Nutrition Goals Re-Evaluation:   Nutrition Goals Discharge (Final Nutrition Goals Re-Evaluation):   Psychosocial: Target Goals: Acknowledge presence or absence of significant depression and/or stress, maximize coping skills, provide positive support system. Participant is able to verbalize types and ability to use techniques and skills needed for reducing stress and depression.   Initial Review & Psychosocial Screening: Initial Psych Review & Screening - 11/30/16 1410      Initial Review   Current issues with  Current Sleep Concerns      Family Dynamics   Good Support System?  Yes    Comments  Wife and daughter      Barriers   Psychosocial barriers to participate in program  The patient should benefit from training in stress management and relaxation.      Screening Interventions   Interventions  Yes;Encouraged to exercise;Provide feedback about the scores to participant;Program counselor consult;To provide support and resources with identified psychosocial needs    Expected Outcomes  Short Term goal: Utilizing psychosocial counselor, staff and physician to assist with identification of specific Stressors or current issues interfering with healing process. Setting desired goal for each stressor or current issue identified.;Long Term Goal: Stressors or current issues are controlled or eliminated.;Short Term goal: Identification and review with participant of any Quality of Life or Depression concerns found by scoring the questionnaire.;Long Term goal: The participant improves quality of Life and PHQ9 Scores as seen by post scores and/or verbalization of changes       Quality of Life Scores:  Quality of Life - 11/30/16 1411      Quality of Life Scores   Health/Function Pre  27.17 %    Socioeconomic Pre  30 %    Psych/Spiritual Pre  30 %    Family Pre  30 %    GLOBAL Pre  28.78 %       PHQ-9: Recent Review Flowsheet Data    Depression screen [facility]  2/9 11/30/2016   Decreased Interest 2   Down, Depressed, Hopeless 2   PHQ - 2 Score 4   Altered sleeping 2   Tired, decreased energy 3   Change in appetite 3   Feeling bad or failure about yourself  0   Trouble concentrating 2   Moving slowly or fidgety/restless 1   Suicidal thoughts 0   PHQ-9 Score 15   Difficult doing work/chores Somewhat difficult     Interpretation  of Total Score  Total Score Depression Severity:  1-4 = Minimal depression, 5-9 = Mild depression, 10-14 = Moderate depression, 15-19 = Moderately severe depression, 20-27 = Severe depression   Psychosocial Evaluation and Intervention:   Psychosocial Re-Evaluation:   Psychosocial Discharge (Final Psychosocial Re-Evaluation):   Vocational Rehabilitation: Provide vocational rehab assistance to qualifying candidates.   Vocational Rehab Evaluation & Intervention: Vocational Rehab - 11/30/16 1416      Initial Vocational Rehab Evaluation & Intervention   Assessment shows need for Vocational Rehabilitation  No       Education: Education Goals: Education classes will be provided on a variety of topics geared toward better understanding of heart health and risk factor modification. Participant will state understanding/return demonstration of topics presented as noted by education test scores.  Learning Barriers/Preferences: Learning Barriers/Preferences - 11/30/16 1415      Learning Barriers/Preferences   Learning Barriers  Hearing    Learning Preferences  Verbal Instruction;Skilled Demonstration       Education Topics: General Nutrition Guidelines/Fats and Fiber: -Group instruction provided by verbal, written material, models and posters to present the general guidelines for heart healthy nutrition. Gives an explanation and review of dietary fats and fiber.   Controlling Sodium/Reading Food Labels: -Group verbal and written material supporting the discussion of sodium use in heart healthy nutrition.  Review and explanation with models, verbal and written materials for utilization of the food label.   Exercise Physiology & Risk Factors: - Group verbal and written instruction with models to review the exercise physiology of the cardiovascular system and associated critical values. Details cardiovascular disease risk factors and the goals associated with each risk factor.   Aerobic Exercise & Resistance Training: - Gives group verbal and written discussion on the health impact of inactivity. On the components of aerobic and resistive training programs and the benefits of this training and how to safely progress through these programs.   Flexibility, Balance, General Exercise Guidelines: - Provides group verbal and written instruction on the benefits of flexibility and balance training programs. Provides general exercise guidelines with specific guidelines to those with heart or lung disease. Demonstration and skill practice provided.   Stress Management: - Provides group verbal and written instruction about the health risks of elevated stress, cause of high stress, and healthy ways to reduce stress.   Depression: - Provides group verbal and written instruction on the correlation between heart/lung disease and depressed mood, treatment options, and the stigmas associated with seeking treatment.   Anatomy & Physiology of the Heart: - Group verbal and written instruction and models provide basic cardiac anatomy and physiology, with the coronary electrical and arterial systems. Review of: AMI, Angina, Valve disease, Heart Failure, Cardiac Arrhythmia, Pacemakers, and the ICD.   Cardiac Procedures: - Group verbal and written instruction to review commonly prescribed medications for heart disease. Reviews the medication, class of the drug, and side effects. Includes the steps to properly store meds and maintain the prescription regimen. (beta blockers and nitrates)   Cardiac Medications  I: - Group verbal and written instruction to review commonly prescribed medications for heart disease. Reviews the medication, class of the drug, and side effects. Includes the steps to properly store meds and maintain the prescription regimen.   Cardiac Medications II: -Group verbal and written instruction to review commonly prescribed medications for heart disease. Reviews the medication, class of the drug, and side effects. (all other drug classes)    Go Sex-Intimacy & Heart Disease, Get SMART - Goal Setting: -  Group verbal and written instruction through game format to discuss heart disease and the return to sexual intimacy. Provides group verbal and written material to discuss and apply goal setting through the application of the S.M.A.R.T. Method.   Other Matters of the Heart: - Provides group verbal, written materials and models to describe Heart Failure, Angina, Valve Disease, Peripheral Artery Disease, and Diabetes in the realm of heart disease. Includes description of the disease process and treatment options available to the cardiac patient.   Exercise & Equipment Safety: - Individual verbal instruction and demonstration of equipment use and safety with use of the equipment.   Cardiac Rehab from 11/30/2016 in [facility] Cardiac and Pulmonary Rehab  Date  11/30/16  Educator  KS  Instruction Review Code  1- Verbalizes Understanding      Infection Prevention: - Provides verbal and written material to individual with discussion of infection control including proper hand washing and proper equipment cleaning during exercise session.   Cardiac Rehab from 11/30/2016 in [facility] Cardiac and Pulmonary Rehab  Date  11/30/16  Educator  KS  Instruction Review Code  1- Verbalizes Understanding      Falls Prevention: - Provides verbal and written material to individual with discussion of falls prevention and safety.   Cardiac Rehab from 11/30/2016 in [facility] Cardiac and Pulmonary Rehab  Date   11/30/16  Educator  KS  Instruction Review Code  1- Verbalizes Understanding      Diabetes: - Individual verbal and written instruction to review signs/symptoms of diabetes, desired ranges of glucose level fasting, after meals and with exercise. Acknowledge that pre and post exercise glucose checks will be done for 3 sessions at entry of program.   Other: -Provides group and verbal instruction on various topics (see comments)    Knowledge Questionnaire Score: Knowledge Questionnaire Score - 11/30/16 1415      Knowledge Questionnaire Score   Pre Score  22/28 Reviewed correct answers with patient who verbalized understanding.        Core Components/Risk Factors/Patient Goals at Admission: Personal Goals and Risk Factors at Admission - 11/30/16 1406      Core Components/Risk Factors/Patient Goals on Admission    Weight Management  Yes;Weight Loss    Intervention  Weight Management: Develop a combined nutrition and exercise program designed to reach desired caloric intake, while maintaining appropriate intake of nutrient and fiber, sodium and fats, and appropriate energy expenditure required for the weight goal.;Weight Management: Provide education and appropriate resources to help participant work on and attain dietary goals. patient states he has lost weight recently without trying due to no appetite and not eating much.     Admit Weight  221 lb (100.2 kg)    Goal Weight: Short Term  215 lb (97.5 kg)    Goal Weight: Long Term  211 lb (95.7 kg)    Expected Outcomes  Long Term: Adherence to nutrition and physical activity/exercise program aimed toward attainment of established weight goal;Understanding recommendations for meals to include 15-35% energy as protein, 25-35% energy from fat, 35-60% energy from carbohydrates, less than 200mg  of dietary cholesterol, 20-35 gm of total fiber daily;Understanding of distribution of calorie intake throughout the day with the consumption of 4-5  meals/snacks;Short Term: Continue to assess and modify interventions until short term weight is achieved;Weight Loss: Understanding of general recommendations for a balanced deficit meal plan, which promotes 1-2 lb weight loss per week and includes a negative energy balance of [phone] kcal/d    Improve shortness of breath  with ADL's  Yes    Intervention  Provide education, individualized exercise plan and daily activity instruction to help decrease symptoms of SOB with activities of daily living.    Expected Outcomes  Short Term: Achieves a reduction of symptoms when performing activities of daily living.    Develop more efficient breathing techniques such as purse lipped breathing and diaphragmatic breathing; and practicing self-pacing with activity  Yes    Intervention  Provide education, demonstration and support about specific breathing techniuqes utilized for more efficient breathing. Include techniques such as pursed lipped breathing, diaphragmatic breathing and self-pacing activity.    Expected Outcomes  Short Term: Participant will be able to demonstrate and use breathing techniques as needed throughout daily activities.    Heart Failure  Yes    Intervention  Provide a combined exercise and nutrition program that is supplemented with education, support and counseling about heart failure. Directed toward relieving symptoms such as shortness of breath, decreased exercise tolerance, and extremity edema.    Expected Outcomes  Improve functional capacity of life;Short term: Attendance in program 2-3 days a week with increased exercise capacity. Reported lower sodium intake. Reported increased fruit and vegetable intake. Reports medication compliance.;Short term: Daily weights obtained and reported for increase. Utilizing diuretic protocols set by physician.;Long term: Adoption of self-care skills and reduction of barriers for early signs and symptoms recognition and intervention leading to self-care  maintenance.    Hypertension  Yes    Intervention  Provide education on lifestyle modifcations including regular physical activity/exercise, weight management, moderate sodium restriction and increased consumption of fresh fruit, vegetables, and low fat dairy, alcohol moderation, and smoking cessation.;Monitor prescription use compliance.    Expected Outcomes  Short Term: Continued assessment and intervention until BP is < 140/20mm HG in hypertensive participants. < 130/11mm HG in hypertensive participants with diabetes, heart failure or chronic kidney disease.;Long Term: Maintenance of blood pressure at goal levels.    Lipids  Yes    Intervention  Provide education and support for participant on nutrition & aerobic/resistive exercise along with prescribed medications to achieve LDL 70mg , HDL >40mg .    Expected Outcomes  Short Term: Participant states understanding of desired cholesterol values and is compliant with medications prescribed. Participant is following exercise prescription and nutrition guidelines.;Long Term: Cholesterol controlled with medications as prescribed, with individualized exercise RX and with personalized nutrition plan. Value goals: LDL < 70mg , HDL > 40 mg.    Stress  Yes    Intervention  Offer individual and/or small group education and counseling on adjustment to heart disease, stress management and health-related lifestyle change. Teach and support self-help strategies.;Refer participants experiencing significant psychosocial distress to appropriate mental health specialists for further evaluation and treatment. When possible, include family members and significant others in education/counseling sessions.    Expected Outcomes  Short Term: Participant demonstrates changes in health-related behavior, relaxation and other stress management skills, ability to obtain effective social support, and compliance with psychotropic medications if prescribed.;Long Term: Emotional wellbeing  is indicated by absence of clinically significant psychosocial distress or social isolation.       Core Components/Risk Factors/Patient Goals Review:    Core Components/Risk Factors/Patient Goals at Discharge (Final Review):    ITP Comments: ITP Comments    Row Name 11/30/16 1358           ITP Comments  Medical Review Completed; initial ITP created. Diagnosis Documentation can be found in [facility] encounter dated 11/07/2016.  Comments: Initiail ITP

## 2016-11-30 NOTE — Progress Notes (Signed)
Daily Session Note  Patient Details  Name: Jacob Avila MRN: [id number] Date of Birth: May 24, 1927 Referring Provider:    Encounter Date: 11/30/2016  Check In: Session Check In - 11/30/16 1357      Check-In   Location  [facility]    Staff Present  Darel Hong, RN BSN;Jessica Luan Pulling, MA, ACSM RCEP, Exercise Physiologist    Supervising physician immediately available to respond to emergencies  See telemetry face sheet for immediately available ER MD    Medication changes reported      No    Fall or balance concerns reported     No    Tobacco Cessation  No Change    Warm-up and Cool-down  Performed as group-led instruction    Resistance Training Performed  Yes    VAD Patient?  No      Pain Assessment   Currently in Pain?  No/denies        Exercise Prescription Changes - 11/30/16 1400      Response to Exercise   Blood Pressure (Admit)  116/64    Blood Pressure (Exercise)  124/64    Blood Pressure (Exit)  112/64    Heart Rate (Admit)  109 bpm    Heart Rate (Exercise)  152 bpm    Heart Rate (Exit)  106 bpm    Oxygen Saturation (Admit)  99 %    Oxygen Saturation (Exercise)  97 %    Rating of Perceived Exertion (Exercise)  13       Social History   Tobacco Use  Smoking Status Former Smoker  . Types: Cigarettes  . Last attempt to quit: 01/05/1978  . Years since quitting: 38.9  Smokeless Tobacco Never Used    Goals Met:  Exercise tolerated well Personal goals reviewed No report of cardiac concerns or symptoms Strength training completed today  Goals Unmet:  Not Applicable  Comments: Med review and 6MW test done.    Dr. Emily Filbert is Medical Director for [place] and [facility].

## 2016-11-30 NOTE — Patient Instructions (Signed)
Patient Instructions  Patient Details  Name: Jacob Avila MRN: [id number] Date of Birth: 08-04-1927 Referring Provider:  Sandria Bales, MD  Below are the personal goals you chose as well as exercise and nutrition goals. Our goal is to help you keep on track towards obtaining and maintaining your goals. We will be discussing your progress on these goals with you throughout the program.  Initial Exercise Prescription: Initial Exercise Prescription - 11/30/16 1400      Date of Initial Exercise RX and Referring Provider   Date  11/30/16    Referring Provider  Mora Appl MD      Treadmill   MPH  2.3    Grade  0    Minutes  15    METs  2.76      Recumbant Bike   Level  1    RPM  50    Minutes  15    METs  2.5      NuStep   Level  2    SPM  80    Minutes  15    METs  2.5      Prescription Details   Frequency (times per week)  3    Duration  Progress to 45 minutes of aerobic exercise without signs/symptoms of physical distress      Intensity   THRR 40-80% of Max Heartrate  119-129    Ratings of Perceived Exertion  11-13    Perceived Dyspnea  0-4      Progression   Progression  Continue to progress workloads to maintain intensity without signs/symptoms of physical distress.      Resistance Training   Training Prescription  Yes    Weight  3 lbs    Reps  10-15       Exercise Goals: Frequency: Be able to perform aerobic exercise three times per week working toward 3-5 days per week.  Intensity: Work with a perceived exertion of 11 (fairly light) - 15 (hard) as tolerated. Follow your new exercise prescription and watch for changes in prescription as you progress with the program. Changes will be reviewed with you when they are made.  Duration: You should be able to do 30 minutes of continuous aerobic exercise in addition to a 5 minute warm-up and a 5 minute cool-down routine.  Nutrition Goals: Your personal nutrition goals will be established when you  do your nutrition analysis with the dietician.  The following are nutrition guidelines to follow: Cholesterol < 200mg /day Sodium < 1500mg /day Fiber: Men over 50 yrs - 30 grams per day  Personal Goals: Personal Goals and Risk Factors at Admission - 11/30/16 1406      Core Components/Risk Factors/Patient Goals on Admission    Weight Management  Yes;Weight Loss    Intervention  Weight Management: Develop a combined nutrition and exercise program designed to reach desired caloric intake, while maintaining appropriate intake of nutrient and fiber, sodium and fats, and appropriate energy expenditure required for the weight goal.;Weight Management: Provide education and appropriate resources to help participant work on and attain dietary goals. patient states he has lost weight recently without trying due to no appetite and not eating much.     Admit Weight  221 lb (100.2 kg)    Goal Weight: Short Term  215 lb (97.5 kg)    Goal Weight: Long Term  211 lb (95.7 kg)    Expected Outcomes  Long Term: Adherence to nutrition and physical activity/exercise program aimed toward attainment of  established weight goal;Understanding recommendations for meals to include 15-35% energy as protein, 25-35% energy from fat, 35-60% energy from carbohydrates, less than 200mg  of dietary cholesterol, 20-35 gm of total fiber daily;Understanding of distribution of calorie intake throughout the day with the consumption of 4-5 meals/snacks;Short Term: Continue to assess and modify interventions until short term weight is achieved;Weight Loss: Understanding of general recommendations for a balanced deficit meal plan, which promotes 1-2 lb weight loss per week and includes a negative energy balance of [phone] kcal/d    Improve shortness of breath with ADL's  Yes    Intervention  Provide education, individualized exercise plan and daily activity instruction to help decrease symptoms of SOB with activities of daily living.     Expected Outcomes  Short Term: Achieves a reduction of symptoms when performing activities of daily living.    Develop more efficient breathing techniques such as purse lipped breathing and diaphragmatic breathing; and practicing self-pacing with activity  Yes    Intervention  Provide education, demonstration and support about specific breathing techniuqes utilized for more efficient breathing. Include techniques such as pursed lipped breathing, diaphragmatic breathing and self-pacing activity.    Expected Outcomes  Short Term: Participant will be able to demonstrate and use breathing techniques as needed throughout daily activities.    Heart Failure  Yes    Intervention  Provide a combined exercise and nutrition program that is supplemented with education, support and counseling about heart failure. Directed toward relieving symptoms such as shortness of breath, decreased exercise tolerance, and extremity edema.    Expected Outcomes  Improve functional capacity of life;Short term: Attendance in program 2-3 days a week with increased exercise capacity. Reported lower sodium intake. Reported increased fruit and vegetable intake. Reports medication compliance.;Short term: Daily weights obtained and reported for increase. Utilizing diuretic protocols set by physician.;Long term: Adoption of self-care skills and reduction of barriers for early signs and symptoms recognition and intervention leading to self-care maintenance.    Hypertension  Yes    Intervention  Provide education on lifestyle modifcations including regular physical activity/exercise, weight management, moderate sodium restriction and increased consumption of fresh fruit, vegetables, and low fat dairy, alcohol moderation, and smoking cessation.;Monitor prescription use compliance.    Expected Outcomes  Short Term: Continued assessment and intervention until BP is < 140/21mm HG in hypertensive participants. < 130/29mm HG in hypertensive  participants with diabetes, heart failure or chronic kidney disease.;Long Term: Maintenance of blood pressure at goal levels.    Lipids  Yes    Intervention  Provide education and support for participant on nutrition & aerobic/resistive exercise along with prescribed medications to achieve LDL 70mg , HDL >40mg .    Expected Outcomes  Short Term: Participant states understanding of desired cholesterol values and is compliant with medications prescribed. Participant is following exercise prescription and nutrition guidelines.;Long Term: Cholesterol controlled with medications as prescribed, with individualized exercise RX and with personalized nutrition plan. Value goals: LDL < 70mg , HDL > 40 mg.    Stress  Yes    Intervention  Offer individual and/or small group education and counseling on adjustment to heart disease, stress management and health-related lifestyle change. Teach and support self-help strategies.;Refer participants experiencing significant psychosocial distress to appropriate mental health specialists for further evaluation and treatment. When possible, include family members and significant others in education/counseling sessions.    Expected Outcomes  Short Term: Participant demonstrates changes in health-related behavior, relaxation and other stress management skills, ability to obtain effective social support, and compliance with  psychotropic medications if prescribed.;Long Term: Emotional wellbeing is indicated by absence of clinically significant psychosocial distress or social isolation.       Tobacco Use Initial Evaluation: Social History   Tobacco Use  Smoking Status Former Smoker  . Types: Cigarettes  . Last attempt to quit: 01/05/1978  . Years since quitting: 38.9  Smokeless Tobacco Never Used    Exercise Goals and Review: Exercise Goals    Row Name 11/30/16 1503             Exercise Goals   Increase Physical Activity  Yes       Intervention  Provide advice,  education, support and counseling about physical activity/exercise needs.;Develop an individualized exercise prescription for aerobic and resistive training based on initial evaluation findings, risk stratification, comorbidities and participant's personal goals.       Expected Outcomes  Achievement of increased cardiorespiratory fitness and enhanced flexibility, muscular endurance and strength shown through measurements of functional capacity and personal statement of participant.       Increase Strength and Stamina  Yes       Intervention  Provide advice, education, support and counseling about physical activity/exercise needs.;Develop an individualized exercise prescription for aerobic and resistive training based on initial evaluation findings, risk stratification, comorbidities and participant's personal goals.       Expected Outcomes  Achievement of increased cardiorespiratory fitness and enhanced flexibility, muscular endurance and strength shown through measurements of functional capacity and personal statement of participant.       Able to understand and use rate of perceived exertion (RPE) scale  Yes       Intervention  Provide education and explanation on how to use RPE scale       Expected Outcomes  Short Term: Able to use RPE daily in rehab to express subjective intensity level;Long Term:  Able to use RPE to guide intensity level when exercising independently       Knowledge and understanding of Target Heart Rate Range (THRR)  Yes       Intervention  Provide education and explanation of THRR including how the numbers were predicted and where they are located for reference       Expected Outcomes  Short Term: Able to state/look up THRR;Long Term: Able to use THRR to govern intensity when exercising independently;Short Term: Able to use daily as guideline for intensity in rehab       Able to check pulse independently  Yes       Intervention  Provide education and demonstration on how to check  pulse in carotid and radial arteries.;Review the importance of being able to check your own pulse for safety during independent exercise       Expected Outcomes  Short Term: Able to explain why pulse checking is important during independent exercise;Long Term: Able to check pulse independently and accurately       Understanding of Exercise Prescription  Yes       Intervention  Provide education, explanation, and written materials on patient's individual exercise prescription       Expected Outcomes  Short Term: Able to explain program exercise prescription;Long Term: Able to explain home exercise prescription to exercise independently          Copy of goals given to participant.

## 2016-12-07 ENCOUNTER — Encounter: Payer: Medicare Other | Attending: Cardiovascular Disease | Admitting: *Deleted

## 2016-12-07 DIAGNOSIS — I2510 Atherosclerotic heart disease of native coronary artery without angina pectoris: Secondary | ICD-10-CM | POA: Insufficient documentation

## 2016-12-07 DIAGNOSIS — Z7982 Long term (current) use of aspirin: Secondary | ICD-10-CM | POA: Insufficient documentation

## 2016-12-07 DIAGNOSIS — Z79899 Other long term (current) drug therapy: Secondary | ICD-10-CM | POA: Diagnosis not present

## 2016-12-07 DIAGNOSIS — Z955 Presence of coronary angioplasty implant and graft: Secondary | ICD-10-CM

## 2016-12-07 DIAGNOSIS — E785 Hyperlipidemia, unspecified: Secondary | ICD-10-CM | POA: Insufficient documentation

## 2016-12-07 DIAGNOSIS — Z87891 Personal history of nicotine dependence: Secondary | ICD-10-CM | POA: Insufficient documentation

## 2016-12-07 DIAGNOSIS — Z7902 Long term (current) use of antithrombotics/antiplatelets: Secondary | ICD-10-CM | POA: Diagnosis not present

## 2016-12-07 DIAGNOSIS — I4891 Unspecified atrial fibrillation: Secondary | ICD-10-CM | POA: Insufficient documentation

## 2016-12-07 DIAGNOSIS — I440 Atrioventricular block, first degree: Secondary | ICD-10-CM | POA: Diagnosis not present

## 2016-12-07 DIAGNOSIS — I10 Essential (primary) hypertension: Secondary | ICD-10-CM | POA: Insufficient documentation

## 2016-12-07 NOTE — Progress Notes (Signed)
Daily Session Note  Patient Details  Name: Jacob Avila MRN: [id number] Date of Birth: [birth year] Referring Provider:     Cardiac Rehab from 11/30/2016 in [facility]  Referring Provider  Mora Appl MD      Encounter Date: 12/07/2016  Check In: Session Check In - 12/07/16 0800      Check-In   Location  [facility]    Staff Present  Earlean Shawl, BS, ACSM CEP, Exercise Physiologist;Susanne Bice, RN, BSN, CCRP;Jessica Luan Pulling, MA, ACSM RCEP, Exercise Physiologist    Supervising physician immediately available to respond to emergencies  See telemetry face sheet for immediately available ER MD    Medication changes reported      No    Fall or balance concerns reported     No    Warm-up and Cool-down  Performed on first and last piece of equipment    Resistance Training Performed  Yes    VAD Patient?  No      Pain Assessment   Currently in Pain?  No/denies          Social History   Tobacco Use  Smoking Status Former Smoker  . Types: Cigarettes  . Last attempt to quit: 01/05/1978  . Years since quitting: 38.9  Smokeless Tobacco Never Used    Goals Met:  Exercise tolerated well Personal goals reviewed No report of cardiac concerns or symptoms Strength training completed today  Goals Unmet:  Not Applicable  Comments: First full day of exercise!  Patient was oriented to gym and equipment including functions, settings, policies, and procedures.  Patient's individual exercise prescription and treatment plan were reviewed.  All starting workloads were established based on the results of the 6 minute walk test done at initial orientation visit.  The plan for exercise progression was also introduced and progression will be customized based on patient's performance and goals.    Dr. Emily Filbert is Medical Director for [place] and [facility].

## 2016-12-09 ENCOUNTER — Encounter: Payer: Self-pay | Admitting: *Deleted

## 2016-12-09 DIAGNOSIS — Z955 Presence of coronary angioplasty implant and graft: Secondary | ICD-10-CM | POA: Diagnosis not present

## 2016-12-09 NOTE — Progress Notes (Signed)
Cardiac Individual Treatment Plan  Patient Details  Name: Jacob Avila MRN: [id number] Date of Birth: [birth year] Referring Provider:     Cardiac Rehab from 11/30/2016 in [facility] Cardiac and Pulmonary Rehab  Referring Provider  Mora Appl MD      Initial Encounter Date:    Cardiac Rehab from 11/30/2016 in [facility] Cardiac and Pulmonary Rehab  Date  11/30/16  Referring Provider  Mora Appl MD      Visit Diagnosis: Status post coronary artery stent placement  Patient's Home Medications on Admission:  Current Outpatient Medications:  .  aspirin EC 81 MG tablet, Take 81 mg by mouth., Disp: , Rfl:  .  atorvastatin (LIPITOR) 80 MG tablet, Take 80 mg by mouth., Disp: , Rfl:  .  clopidogrel (PLAVIX) 75 MG tablet, Take 75 mg by mouth., Disp: , Rfl:  .  furosemide (LASIX) 20 MG tablet, Take 40 mg by mouth., Disp: , Rfl:  .  lisinopril (PRINIVIL,ZESTRIL) 2.5 MG tablet, Take 2.5 mg by mouth., Disp: , Rfl:  .  metoprolol succinate (TOPROL-XL) 50 MG 24 hr tablet, Take 50 mg by mouth., Disp: , Rfl:  .  omeprazole (PRILOSEC) 20 MG capsule, Take 20 mg by mouth., Disp: , Rfl:  .  simvastatin (ZOCOR) 40 MG tablet, Take 40 mg by mouth., Disp: , Rfl:   Past Medical History: Past Medical History:  Diagnosis Date  . 1st degree AV block 05/13/2012  . Arteriosclerosis of coronary artery 11/12/2014  . Atrial fibrillation ([place]) 11/30/2016  . BP (high blood pressure) 05/12/2012  . Bradycardia 05/12/2012  . Dizziness 05/12/2012  . HLD (hyperlipidemia)     Tobacco Use: Social History   Tobacco Use  Smoking Status Former Smoker  . Types: Cigarettes  . Last attempt to quit: 01/05/1978  . Years since quitting: 38.9  Smokeless Tobacco Never Used    Labs: Recent Review Flowsheet Data    There is no flowsheet data to display.       Exercise Target Goals:    Exercise Program Goal: Individual exercise prescription set with THRR, safety & activity barriers. Participant demonstrates  ability to understand and report RPE using BORG scale, to self-measure pulse accurately, and to acknowledge the importance of the exercise prescription.  Exercise Prescription Goal: Starting with aerobic activity 30 plus minutes a day, 3 days per week for initial exercise prescription. Provide home exercise prescription and guidelines that participant acknowledges understanding prior to discharge.  Activity Barriers & Risk Stratification: Activity Barriers & Cardiac Risk Stratification - 11/30/16 1413      Activity Barriers & Cardiac Risk Stratification   Activity Barriers  Shortness of Breath;Deconditioning;Muscular Weakness;Balance Concerns    Cardiac Risk Stratification  Moderate       6 Minute Walk: 6 Minute Walk    Row Name 11/30/16 1452         6 Minute Walk   Phase  Initial     Distance  1265 feet     Walk Time  6 minutes     # of Rest Breaks  0     MPH  2.4     METS  2.74     RPE  13     Perceived Dyspnea   1     VO2 Peak  9.6     Symptoms  Yes (comment)     Comments  SOB     Resting HR  109 bpm     Resting BP  116/64     Resting Oxygen  Saturation   99 %     Exercise Oxygen Saturation  during 6 min walk  97 %     Max Ex. HR  152 bpm     Max Ex. BP  160/74     2 Minute Post BP  124/64        Oxygen Initial Assessment:   Oxygen Re-Evaluation:   Oxygen Discharge (Final Oxygen Re-Evaluation):   Initial Exercise Prescription: Initial Exercise Prescription - 11/30/16 1400      Date of Initial Exercise RX and Referring Provider   Date  11/30/16    Referring Provider  Mora Appl MD      Treadmill   MPH  2.3    Grade  0    Minutes  15    METs  2.76      Recumbant Bike   Level  1    RPM  50    Minutes  15    METs  2.5      NuStep   Level  2    SPM  80    Minutes  15    METs  2.5      Prescription Details   Frequency (times per week)  3    Duration  Progress to 45 minutes of aerobic exercise without signs/symptoms of physical distress       Intensity   THRR 40-80% of Max Heartrate  119-129    Ratings of Perceived Exertion  11-13    Perceived Dyspnea  0-4      Progression   Progression  Continue to progress workloads to maintain intensity without signs/symptoms of physical distress.      Resistance Training   Training Prescription  Yes    Weight  3 lbs    Reps  10-15       Perform Capillary Blood Glucose checks as needed.  Exercise Prescription Changes: Exercise Prescription Changes    Row Name 11/30/16 1400             Response to Exercise   Blood Pressure (Admit)  116/64       Blood Pressure (Exercise)  124/64       Blood Pressure (Exit)  112/64       Heart Rate (Admit)  109 bpm       Heart Rate (Exercise)  152 bpm       Heart Rate (Exit)  106 bpm       Oxygen Saturation (Admit)  99 %       Oxygen Saturation (Exercise)  97 %       Rating of Perceived Exertion (Exercise)  13       Perceived Dyspnea (Exercise)  1       Symptoms  SOB       Comments  walk test resutls          Exercise Comments: Exercise Comments    Row Name 12/07/16 0804           Exercise Comments  First full day of exercise!  Patient was oriented to gym and equipment including functions, settings, policies, and procedures.  Patient's individual exercise prescription and treatment plan were reviewed.  All starting workloads were established based on the results of the 6 minute walk test done at initial orientation visit.  The plan for exercise progression was also introduced and progression will be customized based on patient's performance and goals.          Exercise Goals and Review: Exercise  Goals    Row Name 11/30/16 1503             Exercise Goals   Increase Physical Activity  Yes       Intervention  Provide advice, education, support and counseling about physical activity/exercise needs.;Develop an individualized exercise prescription for aerobic and resistive training based on initial evaluation findings, risk  stratification, comorbidities and participant's personal goals.       Expected Outcomes  Achievement of increased cardiorespiratory fitness and enhanced flexibility, muscular endurance and strength shown through measurements of functional capacity and personal statement of participant.       Increase Strength and Stamina  Yes       Intervention  Provide advice, education, support and counseling about physical activity/exercise needs.;Develop an individualized exercise prescription for aerobic and resistive training based on initial evaluation findings, risk stratification, comorbidities and participant's personal goals.       Expected Outcomes  Achievement of increased cardiorespiratory fitness and enhanced flexibility, muscular endurance and strength shown through measurements of functional capacity and personal statement of participant.       Able to understand and use rate of perceived exertion (RPE) scale  Yes       Intervention  Provide education and explanation on how to use RPE scale       Expected Outcomes  Short Term: Able to use RPE daily in rehab to express subjective intensity level;Long Term:  Able to use RPE to guide intensity level when exercising independently       Knowledge and understanding of Target Heart Rate Range (THRR)  Yes       Intervention  Provide education and explanation of THRR including how the numbers were predicted and where they are located for reference       Expected Outcomes  Short Term: Able to state/look up THRR;Long Term: Able to use THRR to govern intensity when exercising independently;Short Term: Able to use daily as guideline for intensity in rehab       Able to check pulse independently  Yes       Intervention  Provide education and demonstration on how to check pulse in carotid and radial arteries.;Review the importance of being able to check your own pulse for safety during independent exercise       Expected Outcomes  Short Term: Able to explain why pulse  checking is important during independent exercise;Long Term: Able to check pulse independently and accurately       Understanding of Exercise Prescription  Yes       Intervention  Provide education, explanation, and written materials on patient's individual exercise prescription       Expected Outcomes  Short Term: Able to explain program exercise prescription;Long Term: Able to explain home exercise prescription to exercise independently          Exercise Goals Re-Evaluation : Exercise Goals Re-Evaluation    Row Name 12/07/16 0805             Exercise Goal Re-Evaluation   Exercise Goals Review  Knowledge and understanding of Target Heart Rate Range (THRR);Able to understand and use rate of perceived exertion (RPE) scale;Understanding of Exercise Prescription       Comments  Reviewed RPE scale, THR and program prescription with pt today.  Pt voiced understanding and was given a copy of goals to take home.        Expected Outcomes  Short: Use RPE daily to regulate intensity.  Long: Follow program prescription in THR.  Discharge Exercise Prescription (Final Exercise Prescription Changes): Exercise Prescription Changes - 11/30/16 1400      Response to Exercise   Blood Pressure (Admit)  116/64    Blood Pressure (Exercise)  124/64    Blood Pressure (Exit)  112/64    Heart Rate (Admit)  109 bpm    Heart Rate (Exercise)  152 bpm    Heart Rate (Exit)  106 bpm    Oxygen Saturation (Admit)  99 %    Oxygen Saturation (Exercise)  97 %    Rating of Perceived Exertion (Exercise)  13    Perceived Dyspnea (Exercise)  1    Symptoms  SOB    Comments  walk test resutls       Nutrition:  Target Goals: Understanding of nutrition guidelines, daily intake of sodium <1535m, cholesterol <2068m calories 30% from fat and 7% or less from saturated fats, daily to have 5 or more servings of fruits and vegetables.  Biometrics: Pre Biometrics - 11/30/16 1504      Pre Biometrics   Height  6'  0.5" (1.842 m)    Weight  221 lb (100.2 kg)    Waist Circumference  39 inches    Hip Circumference  43 inches    Waist to Hip Ratio  0.91 %    BMI (Calculated)  29.54    Single Leg Stand  1.65 seconds        Nutrition Therapy Plan and Nutrition Goals: Nutrition Therapy & Goals - 11/30/16 1409      Intervention Plan   Intervention  Prescribe, educate and counsel regarding individualized specific dietary modifications aiming towards targeted core components such as weight, hypertension, lipid management, diabetes, heart failure and other comorbidities.;Nutrition handout(s) given to patient.    Expected Outcomes  Short Term Goal: Understand basic principles of dietary content, such as calories, fat, sodium, cholesterol and nutrients.;Short Term Goal: A plan has been developed with personal nutrition goals set during dietitian appointment.;Long Term Goal: Adherence to prescribed nutrition plan.       Nutrition Discharge: Rate Your Plate Scores: Nutrition Assessments - 11/30/16 1409      MEDFICTS Scores   Pre Score  6 Patient states he is not eating much b/c he has no appetite.       Nutrition Goals Re-Evaluation:   Nutrition Goals Discharge (Final Nutrition Goals Re-Evaluation):   Psychosocial: Target Goals: Acknowledge presence or absence of significant depression and/or stress, maximize coping skills, provide positive support system. Participant is able to verbalize types and ability to use techniques and skills needed for reducing stress and depression.   Initial Review & Psychosocial Screening: Initial Psych Review & Screening - 11/30/16 1410      Initial Review   Current issues with  Current Sleep Concerns      Family Dynamics   Good Support System?  Yes    Comments  Wife and daughter      Barriers   Psychosocial barriers to participate in program  The patient should benefit from training in stress management and relaxation.      Screening Interventions    Interventions  Yes;Encouraged to exercise;Provide feedback about the scores to participant;Program counselor consult;To provide support and resources with identified psychosocial needs    Expected Outcomes  Short Term goal: Utilizing psychosocial counselor, staff and physician to assist with identification of specific Stressors or current issues interfering with healing process. Setting desired goal for each stressor or current issue identified.;Long Term Goal: Stressors or current issues are  controlled or eliminated.;Short Term goal: Identification and review with participant of any Quality of Life or Depression concerns found by scoring the questionnaire.;Long Term goal: The participant improves quality of Life and PHQ9 Scores as seen by post scores and/or verbalization of changes       Quality of Life Scores:  Quality of Life - 11/30/16 1411      Quality of Life Scores   Health/Function Pre  27.17 %    Socioeconomic Pre  30 %    Psych/Spiritual Pre  30 %    Family Pre  30 %    GLOBAL Pre  28.78 %       PHQ-9: Recent Review Flowsheet Data    Depression screen [facility] 2/9 12/07/2016 11/30/2016   Decreased Interest 2 2   Down, Depressed, Hopeless 1 2   PHQ - 2 Score 3 4   Altered sleeping 0 2   Tired, decreased energy 2 3   Change in appetite 3 3   Feeling bad or failure about yourself  0 0   Trouble concentrating 1 2   Moving slowly or fidgety/restless 1 1   Suicidal thoughts 0 0   PHQ-9 Score 10 15   Difficult doing work/chores Somewhat difficult Somewhat difficult     Interpretation of Total Score  Total Score Depression Severity:  1-4 = Minimal depression, 5-9 = Mild depression, 10-14 = Moderate depression, 15-19 = Moderately severe depression, 20-27 = Severe depression   Psychosocial Evaluation and Intervention: Psychosocial Evaluation - 12/07/16 1009      Psychosocial Evaluation & Interventions   Interventions  Stress management education;Relaxation education;Encouraged to  exercise with the program and follow exercise prescription    Comments  Counselor met with Mr. Tomas today for initial psychosocial evaluation.  He is an 83 year old who recently had a heart attack and a a stent.  He has a strong support system with a spouse of 13 years; a Corporate investment banker and son who live locally and active involvement in his local church.  [name] reports being in good health overall; sleeping better and denies symptoms of depression or anxiety.  He states his appetite has been poor for awhile.  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  His goals for this program are to get healthier; with more energy; and to get back to work!  Staff will follow with Mr. Remer.      Expected Outcomes  [name] will benefit from consistent exercise to achieve his stated goals.  The educational and psychoeducational components of this program will be beneficial is learning more about his condition and coping more positively.  Staff will follow.    Continue Psychosocial Services   Follow up required by staff       Psychosocial Re-Evaluation:   Psychosocial Discharge (Final Psychosocial Re-Evaluation):   Vocational Rehabilitation: Provide vocational rehab assistance to qualifying candidates.   Vocational Rehab Evaluation & Intervention: Vocational Rehab - 11/30/16 1416      Initial Vocational Rehab Evaluation & Intervention   Assessment shows need for Vocational Rehabilitation  No       Education: Education Goals: Education classes will be provided on a variety of topics geared toward better understanding of heart health and risk factor modification. Participant will state understanding/return demonstration of topics presented as noted by education test scores.  Learning Barriers/Preferences: Learning Barriers/Preferences - 11/30/16  [place] Barriers/Preferences   Learning Barriers   Hearing    Learning Preferences  Verbal Instruction;Skilled Demonstration       Education Topics: General Nutrition Guidelines/Fats and Fiber: -Group instruction provided by verbal, written material, models and posters to present the general guidelines for heart healthy nutrition. Gives an explanation and review of dietary fats and fiber.   Controlling Sodium/Reading Food Labels: -Group verbal and written material supporting the discussion of sodium use in heart healthy nutrition. Review and explanation with models, verbal and written materials for utilization of the food label.   Exercise Physiology & Risk Factors: - Group verbal and written instruction with models to review the exercise physiology of the cardiovascular system and associated critical values. Details cardiovascular disease risk factors and the goals associated with each risk factor.   Aerobic Exercise & Resistance Training: - Gives group verbal and written discussion on the health impact of inactivity. On the components of aerobic and resistive training programs and the benefits of this training and how to safely progress through these programs.   Flexibility, Balance, General Exercise Guidelines: - Provides group verbal and written instruction on the benefits of flexibility and balance training programs. Provides general exercise guidelines with specific guidelines to those with heart or lung disease. Demonstration and skill practice provided.   Stress Management: - Provides group verbal and written instruction about the health risks of elevated stress, cause of high stress, and healthy ways to reduce stress.   Depression: - Provides group verbal and written instruction on the correlation between heart/lung disease and depressed mood, treatment options, and the stigmas associated with seeking treatment.   Anatomy & Physiology of the Heart: - Group verbal and written instruction and models provide basic cardiac  anatomy and physiology, with the coronary electrical and arterial systems. Review of: AMI, Angina, Valve disease, Heart Failure, Cardiac Arrhythmia, Pacemakers, and the ICD.   Cardiac Procedures: - Group verbal and written instruction to review commonly prescribed medications for heart disease. Reviews the medication, class of the drug, and side effects. Includes the steps to properly store meds and maintain the prescription regimen. (beta blockers and nitrates)   Cardiac Medications I: - Group verbal and written instruction to review commonly prescribed medications for heart disease. Reviews the medication, class of the drug, and side effects. Includes the steps to properly store meds and maintain the prescription regimen.   Cardiac Medications II: -Group verbal and written instruction to review commonly prescribed medications for heart disease. Reviews the medication, class of the drug, and side effects. (all other drug classes)    Go Sex-Intimacy & Heart Disease, Get SMART - Goal Setting: - Group verbal and written instruction through game format to discuss heart disease and the return to sexual intimacy. Provides group verbal and written material to discuss and apply goal setting through the application of the S.M.A.R.T. Method.   Other Matters of the Heart: - Provides group verbal, written materials and models to describe Heart Failure, Angina, Valve Disease, Peripheral Artery Disease, and Diabetes in the realm of heart disease. Includes description of the disease process and treatment options available to the cardiac patient.   Exercise & Equipment Safety: - Individual verbal instruction and demonstration of equipment use and safety with use of the equipment.   Cardiac Rehab from 12/07/2016 in [facility] Cardiac and Pulmonary Rehab  Date  11/30/16  Educator  KS  Instruction Review Code  1- Verbalizes Understanding      Infection Prevention: -  Provides verbal and written material to  individual with discussion of infection control including proper hand washing and proper equipment cleaning during exercise session.   Cardiac Rehab from 12/07/2016 in [facility] Cardiac and Pulmonary Rehab  Date  11/30/16  Educator  KS  Instruction Review Code  1- Verbalizes Understanding      Falls Prevention: - Provides verbal and written material to individual with discussion of falls prevention and safety.   Cardiac Rehab from 12/07/2016 in [facility] Cardiac and Pulmonary Rehab  Date  11/30/16  Educator  KS  Instruction Review Code  1- Verbalizes Understanding      Diabetes: - Individual verbal and written instruction to review signs/symptoms of diabetes, desired ranges of glucose level fasting, after meals and with exercise. Acknowledge that pre and post exercise glucose checks will be done for 3 sessions at entry of program.   Other: -Provides group and verbal instruction on various topics (see comments)    Knowledge Questionnaire Score: Knowledge Questionnaire Score - 11/30/16 1415      Knowledge Questionnaire Score   Pre Score  22/28 Reviewed correct answers with patient who verbalized understanding.        Core Components/Risk Factors/Patient Goals at Admission: Personal Goals and Risk Factors at Admission - 11/30/16 1406      Core Components/Risk Factors/Patient Goals on Admission    Weight Management  Yes;Weight Loss    Intervention  Weight Management: Develop a combined nutrition and exercise program designed to reach desired caloric intake, while maintaining appropriate intake of nutrient and fiber, sodium and fats, and appropriate energy expenditure required for the weight goal.;Weight Management: Provide education and appropriate resources to help participant work on and attain dietary goals. patient states he has lost weight recently without trying due to no appetite and not eating much.     Admit Weight  221 lb (100.2 kg)    Goal Weight: Short Term  215 lb (97.5 kg)     Goal Weight: Long Term  211 lb (95.7 kg)    Expected Outcomes  Long Term: Adherence to nutrition and physical activity/exercise program aimed toward attainment of established weight goal;Understanding recommendations for meals to include 15-35% energy as protein, 25-35% energy from fat, 35-60% energy from carbohydrates, less than 2104m of dietary cholesterol, 20-35 gm of total fiber daily;Understanding of distribution of calorie intake throughout the day with the consumption of 4-5 meals/snacks;Short Term: Continue to assess and modify interventions until short term weight is achieved;Weight Loss: Understanding of general recommendations for a balanced deficit meal plan, which promotes 1-2 lb weight loss per week and includes a negative energy balance of [phone] kcal/d    Improve shortness of breath with ADL's  Yes    Intervention  Provide education, individualized exercise plan and daily activity instruction to help decrease symptoms of SOB with activities of daily living.    Expected Outcomes  Short Term: Achieves a reduction of symptoms when performing activities of daily living.    Develop more efficient breathing techniques such as purse lipped breathing and diaphragmatic breathing; and practicing self-pacing with activity  Yes    Intervention  Provide education, demonstration and support about specific breathing techniuqes utilized for more efficient breathing. Include techniques such as pursed lipped breathing, diaphragmatic breathing and self-pacing activity.    Expected Outcomes  Short Term: Participant will be able to demonstrate and use breathing techniques as needed throughout daily activities.    Heart Failure  Yes    Intervention  Provide a combined exercise and  nutrition program that is supplemented with education, support and counseling about heart failure. Directed toward relieving symptoms such as shortness of breath, decreased exercise tolerance, and extremity edema.    Expected  Outcomes  Improve functional capacity of life;Short term: Attendance in program 2-3 days a week with increased exercise capacity. Reported lower sodium intake. Reported increased fruit and vegetable intake. Reports medication compliance.;Short term: Daily weights obtained and reported for increase. Utilizing diuretic protocols set by physician.;Long term: Adoption of self-care skills and reduction of barriers for early signs and symptoms recognition and intervention leading to self-care maintenance.    Hypertension  Yes    Intervention  Provide education on lifestyle modifcations including regular physical activity/exercise, weight management, moderate sodium restriction and increased consumption of fresh fruit, vegetables, and low fat dairy, alcohol moderation, and smoking cessation.;Monitor prescription use compliance.    Expected Outcomes  Short Term: Continued assessment and intervention until BP is < 140/42m HG in hypertensive participants. < 130/820mHG in hypertensive participants with diabetes, heart failure or chronic kidney disease.;Long Term: Maintenance of blood pressure at goal levels.    Lipids  Yes    Intervention  Provide education and support for participant on nutrition & aerobic/resistive exercise along with prescribed medications to achieve LDL <7030mHDL >27m53m  Expected Outcomes  Short Term: Participant states understanding of desired cholesterol values and is compliant with medications prescribed. Participant is following exercise prescription and nutrition guidelines.;Long Term: Cholesterol controlled with medications as prescribed, with individualized exercise RX and with personalized nutrition plan. Value goals: LDL < 70mg70mL > 40 mg.    Stress  Yes    Intervention  Offer individual and/or small group education and counseling on adjustment to heart disease, stress management and health-related lifestyle change. Teach and support self-help strategies.;Refer participants  experiencing significant psychosocial distress to appropriate mental health specialists for further evaluation and treatment. When possible, include family members and significant others in education/counseling sessions.    Expected Outcomes  Short Term: Participant demonstrates changes in health-related behavior, relaxation and other stress management skills, ability to obtain effective social support, and compliance with psychotropic medications if prescribed.;Long Term: Emotional wellbeing is indicated by absence of clinically significant psychosocial distress or social isolation.       Core Components/Risk Factors/Patient Goals Review:    Core Components/Risk Factors/Patient Goals at Discharge (Final Review):    ITP Comments: ITP Comments    Row Name 11/30/16 1358 12/09/16 0545         ITP Comments  Medical Review Completed; initial ITP created. Diagnosis Documentation can be found in [facility] dated 11/07/2016.  30 day review. Continue with ITP unless directed changes per Medical Director review.          Comments:

## 2016-12-09 NOTE — Progress Notes (Signed)
Daily Session Note  Patient Details  Name: Jacob Avila MRN: [id number] Date of Birth: [birth year] Referring Provider:     Cardiac Rehab from 11/30/2016 in [facility] Cardiac and Pulmonary Rehab  Referring Provider  Mora Appl MD      Encounter Date: 12/09/2016  Check In: Session Check In - 12/09/16 0722      Check-In   Location  [facility]-Cardiac & Pulmonary Rehab    Staff Present  Justin Mend Lorre Nick, [place], ACSM RCEP, Exercise Physiologist;Susanne Bice, RN, BSN, CCRP    Supervising physician immediately available to respond to emergencies  See telemetry face sheet for immediately available ER MD    Medication changes reported      No    Fall or balance concerns reported     No    Warm-up and Cool-down  Performed on first and last piece of equipment    Resistance Training Performed  Yes    VAD Patient?  No      Pain Assessment   Currently in Pain?  No/denies          Social History   Tobacco Use  Smoking Status Former Smoker  . Types: Cigarettes  . Last attempt to quit: 01/05/1978  . Years since quitting: 38.9  Smokeless Tobacco Never Used    Goals Met:  Independence with exercise equipment Exercise tolerated well No report of cardiac concerns or symptoms Strength training completed today  Goals Unmet:  Not Applicable  Comments: Pt able to follow exercise prescription today without complaint.  Will continue to monitor for progression.   Dr. Emily Filbert is Medical Director for [place] and [facility].

## 2016-12-11 DIAGNOSIS — Z955 Presence of coronary angioplasty implant and graft: Secondary | ICD-10-CM

## 2016-12-11 NOTE — Progress Notes (Signed)
Daily Session Note  Patient Details  Name: Jacob Avila MRN: [id number] Date of Birth: February 05, 1927 Referring Provider:     Cardiac Rehab from 11/30/2016 in [facility] Cardiac and Pulmonary Rehab  Referring Provider  Mora Appl MD      Encounter Date: 12/11/2016  Check In: Session Check In - 12/11/16 0920      Check-In   Location  [facility]-Cardiac & Pulmonary Rehab    Staff Present  Nada Maclachlan, BA, ACSM CEP, Exercise Physiologist    Supervising physician immediately available to respond to emergencies  See telemetry face sheet for immediately available ER MD    Fall or balance concerns reported     No    Warm-up and Cool-down  Performed on first and last piece of equipment    Resistance Training Performed  Yes    VAD Patient?  No      Pain Assessment   Currently in Pain?  No/denies    Multiple Pain Sites  No          Social History   Tobacco Use  Smoking Status Former Smoker  . Types: Cigarettes  . Last attempt to quit: 01/05/1978  . Years since quitting: 38.9  Smokeless Tobacco Never Used    Goals Met:  Independence with exercise equipment Exercise tolerated well No report of cardiac concerns or symptoms Strength training completed today  Goals Unmet:  Not Applicable  Comments: Pt able to follow exercise prescription today without complaint.  Will continue to monitor for progression.    Dr. Emily Filbert is Medical Director for [place] and [facility].

## 2016-12-16 DIAGNOSIS — Z955 Presence of coronary angioplasty implant and graft: Secondary | ICD-10-CM | POA: Diagnosis not present

## 2016-12-16 NOTE — Progress Notes (Signed)
Daily Session Note  Patient Details  Name: Jacob Avila MRN: [id number] Date of Birth: 04-09-1927 Referring Provider:     Cardiac Rehab from 11/30/2016 in [facility] Cardiac and Pulmonary Rehab  Referring Provider  Mora Appl MD      Encounter Date: 12/16/2016  Check In: Session Check In - 12/16/16 0714      Check-In   Staff Present  Heath Lark, RN, BSN, CCRP;Joseph Darrin Nipper, [place], ACSM RCEP, Exercise Physiologist    Supervising physician immediately available to respond to emergencies  See telemetry face sheet for immediately available ER MD    Medication changes reported      No    Fall or balance concerns reported     No    Warm-up and Cool-down  Performed on first and last piece of equipment    Resistance Training Performed  Yes    VAD Patient?  No      Pain Assessment   Currently in Pain?  No/denies        Exercise Prescription Changes - 12/15/16 1300      Response to Exercise   Blood Pressure (Admit)  128/64    Blood Pressure (Exercise)  128/64    Blood Pressure (Exit)  124/70    Heart Rate (Admit)  104 bpm    Heart Rate (Exercise)  160 bpm    Heart Rate (Exit)  109 bpm    Rating of Perceived Exertion (Exercise)  15    Symptoms  none    Duration  Continue with 45 min of aerobic exercise without signs/symptoms of physical distress.    Intensity  THRR unchanged      Progression   Progression  Continue to progress workloads to maintain intensity without signs/symptoms of physical distress.    Average METs  2.62      Resistance Training   Training Prescription  Yes    Weight  3 lbs    Reps  10-15      Interval Training   Interval Training  No      Recumbant Bike   Level  3    Watts  18    Minutes  15    METs  2.79      NuStep   Level  4    Minutes  15    METs  2.1      Track   Laps  46    Minutes  15    METs  3.14       Social History   Tobacco Use  Smoking Status Former Smoker  . Types: Cigarettes  .  Last attempt to quit: 01/05/1978  . Years since quitting: 38.9  Smokeless Tobacco Never Used    Goals Met:  Independence with exercise equipment Exercise tolerated well No report of cardiac concerns or symptoms Strength training completed today  Goals Unmet:  Not Applicable  Comments: Pt able to follow exercise prescription today without complaint.  Will continue to monitor for progression.  Reviewed home exercise with pt today.  Pt plans to walk at home for exercise.   He will be walking on his off days from exercise.  Reviewed THR, pulse, RPE, sign and symptoms, NTG use, and when to call 911 or MD.  Also discussed weather considerations and indoor options.  Pt voiced understanding.    Dr. Emily Filbert is Medical Director for [place] and [facility].

## 2016-12-18 ENCOUNTER — Encounter: Payer: Medicare Other | Admitting: *Deleted

## 2016-12-18 DIAGNOSIS — Z955 Presence of coronary angioplasty implant and graft: Secondary | ICD-10-CM

## 2016-12-18 NOTE — Progress Notes (Signed)
Daily Session Note  Patient Details  Name: Jacob Avila MRN: [id number] Date of Birth: [birth year] Referring Provider:     Cardiac Rehab from 11/30/2016 in [facility] Cardiac and Pulmonary Rehab  Referring Provider  Mora Appl MD      Encounter Date: 12/18/2016  Check In: Session Check In - 12/18/16 1040      Check-In   Location  [facility]Cardiac [facility] Pulmonary Re[facility]    Staff Present  Renita Papa, RN Vickki Hearing, BA, ACSM CEP, Exercise Physiologist;Bethann Qualley Luan Pulling, [place], ACSM RCEP, Exercise Physiologist    Supervising physician immediately available to respond to emergencies  See telemetry face sheet for immediately available ER MD    Medication changes reported      No    Fall or balance concerns reported     No    Warm-up and Cool-down  Performed on first and last piece of equipment    Resistance Training Performed  Yes    VAD Patient?  No      Pain Assessment   Currently in Pain?  No/denies          Social History   Tobacco Use  Smoking Status Former Smoker  . Types: Cigarettes  . Last attempt to quit: 01/05/1978  . Years since quitting: 38.9  Smokeless Tobacco Never Used    Goals Met:  Independence with exercise equipment Exercise tolerated well No report of cardiac concerns or symptoms Strength training completed today  Goals Unmet:  Not Applicable  Comments: Pt able to follow exercise prescription today without complaint.  Will continue to monitor for progression.    Dr. Emily Filbert is Medical Director for [place] and [facility].

## 2016-12-21 ENCOUNTER — Encounter: Payer: Medicare Other | Admitting: *Deleted

## 2016-12-21 DIAGNOSIS — Z955 Presence of coronary angioplasty implant and graft: Secondary | ICD-10-CM

## 2016-12-21 NOTE — Progress Notes (Signed)
Daily Session Note  Patient Details  Name: Jacob Avila MRN: [id number] Date of Birth: 12/16/1927 Referring Provider:     Cardiac Rehab from 11/30/2016 in [facility] Cardiac and Pulmonary Rehab  Referring Provider  Mora Appl MD      Encounter Date: 12/21/2016  Check In: Session Check In - 12/21/16 0833      Check-In   Location  [facility]Cardiac [facility]monary Re[facility]    Staff Present  Earlean Shawl, BS, ACSM CEP, Exercise Physiologist;Susanne Bice, RN, BSN, CCRP;Jessica Luan Pulling, MA, ACSM RCEP, Exercise Physiologist    Supervising physician immediately available to respond to emergencies  See telemetry face sheet for immediately available ER MD    Medication changes reported      No    Fall or balance concerns reported     No    Warm-up and Cool-down  Performed on first and last piece of equipment    Resistance Training Performed  Yes    VAD Patient?  No      Pain Assessment   Currently in Pain?  No/denies    Multiple Pain Sites  No          Social History   Tobacco Use  Smoking Status Former Smoker  . Types: Cigarettes  . Last attempt to quit: 01/05/1978  . Years since quitting: 38.9  Smokeless Tobacco Never Used    Goals Met:  Independence with exercise equipment Exercise tolerated well No report of cardiac concerns or symptoms Strength training completed today  Goals Unmet:  Not Applicable  Comments: Pt able to follow exercise prescription today without complaint.  Will continue to monitor for progression.    Dr. Emily Filbert is Medical Director for [place] and [facility].

## 2016-12-23 DIAGNOSIS — Z955 Presence of coronary angioplasty implant and graft: Secondary | ICD-10-CM

## 2016-12-23 NOTE — Progress Notes (Signed)
Daily Session Note  Patient Details  Name: Jacob Avila MRN: [id number] Date of Birth: [birth year] Referring Provider:     Cardiac Rehab from 11/30/2016 in [facility] Cardiac and Pulmonary Rehab  Referring Provider  Mora Appl MD      Encounter Date: 12/23/2016  Check In: Session Check In - 12/23/16 0719      Check-In   Location  [facility]Card[facility] Re[facility]    Staff Present  Justin Mend RCP,RRT,BSRT;Heath Lark, RN, BSN, CCRP;Jessica Luan Pulling, MA, ACSM RCEP, Exercise Physiologist    Supervising physician immediately available to respond to emergencies  See telemetry face sheet for immediately available ER MD    Medication changes reported      No    Fall or balance concerns reported     No    Warm-up and Cool-down  Performed on first and last piece of equipment    Resistance Training Performed  Yes    VAD Patient?  No      Pain Assessment   Currently in Pain?  No/denies          Social History   Tobacco Use  Smoking Status Former Smoker  . Types: Cigarettes  . Last attempt to quit: 01/05/1978  . Years since quitting: 38.9  Smokeless Tobacco Never Used    Goals Met:  Independence with exercise equipment Exercise tolerated well No report of cardiac concerns or symptoms Strength training completed today  Goals Unmet:  Not Applicable  Comments: Pt able to follow exercise prescription today without complaint.  Will continue to monitor for progression.   Dr. Emily Filbert is Medical Director for [place] and [facility].

## 2016-12-25 DIAGNOSIS — Z955 Presence of coronary angioplasty implant and graft: Secondary | ICD-10-CM

## 2016-12-25 NOTE — Progress Notes (Signed)
Daily Session Note  Patient Details  Name: Jacob Avila MRN: [id number] Date of Birth: 12-28-1927 Referring Provider:     Cardiac Rehab from 11/30/2016 in [facility] Cardiac and Pulmonary Rehab  Referring Provider  Mora Appl MD      Encounter Date: 12/25/2016  Check In: Session Check In - 12/25/16 0839      Check-In   Location  [facility]Cardiac [facility] Pulmonary Rehab    Staff Present  Nada Maclachlan, BA, ACSM CEP, Exercise Physiologist;Jessica Luan Pulling, MA, ACSM RCEP, Exercise Physiologist;Meredith Sherryll Burger, RN BSN    Supervising physician immediately available to respond to emergencies  See telemetry face sheet for immediately available ER MD    Medication changes reported      No    Fall or balance concerns reported     No    Warm-up and Cool-down  Performed on first and last piece of equipment    Resistance Training Performed  Yes    VAD Patient?  No      Pain Assessment   Currently in Pain?  No/denies    Multiple Pain Sites  No          Social History   Tobacco Use  Smoking Status Former Smoker  . Types: Cigarettes  . Last attempt to quit: 01/05/1978  . Years since quitting: 38.9  Smokeless Tobacco Never Used    Goals Met:  Independence with exercise equipment Exercise tolerated well No report of cardiac concerns or symptoms Strength training completed today  Goals Unmet:  Not Applicable  Comments: Pt able to follow exercise prescription today without complaint.  Will continue to monitor for progression.    Dr. Emily Filbert is Medical Director for [place] and [facility].

## 2016-12-28 ENCOUNTER — Encounter: Payer: Medicare Other | Admitting: *Deleted

## 2016-12-28 DIAGNOSIS — Z955 Presence of coronary angioplasty implant and graft: Secondary | ICD-10-CM

## 2016-12-28 NOTE — Progress Notes (Signed)
Daily Session Note  Patient Details  Name: Jacob Avila MRN: [id number] Date of Birth: 18-Nov-1927 Referring Provider:     Cardiac Rehab from 11/30/2016 in [facility] Cardiac and Pulmonary Rehab  Referring Provider  Mora Appl MD      Encounter Date: 12/28/2016  Check In: Session Check In - 12/28/16 0823      Check-In   Location  [facility]Cardiac [facility] Pulmonary Re[facility]    Staff Present  Nada Maclachlan, BA, ACSM CEP, Exercise Physiologist;Ivry Pigue Luan Pulling, MA, ACSM RCEP, Exercise Physiologist;Diane [facility] RN,BSN    Supervising physician immediately available to respond to emergencies  See telemetry face sheet for immediately available ER MD    Medication changes reported      No    Fall or balance concerns reported     No    Warm-up and Cool-down  Performed on first and last piece of equipment    Resistance Training Performed  Yes    VAD Patient?  No      Pain Assessment   Currently in Pain?  No/denies          Social History   Tobacco Use  Smoking Status Former Smoker  . Types: Cigarettes  . Last attempt to quit: 01/05/1978  . Years since quitting: 39.0  Smokeless Tobacco Never Used    Goals Met:  Independence with exercise equipment Exercise tolerated well No report of cardiac concerns or symptoms Strength training completed today  Goals Unmet:  Not Applicable  Comments: Pt able to follow exercise prescription today without complaint.  Will continue to monitor for progression.    Dr. Emily Filbert is Medical Director for [place] and [facility].

## 2016-12-30 ENCOUNTER — Encounter: Payer: Medicare Other | Admitting: *Deleted

## 2016-12-30 DIAGNOSIS — Z955 Presence of coronary angioplasty implant and graft: Secondary | ICD-10-CM | POA: Diagnosis not present

## 2016-12-30 NOTE — Progress Notes (Signed)
Daily Session Note  Patient Details  Name: Jacob Avila MRN: [id number] Date of Birth: 01-07-1927 Referring Provider:     Cardiac Rehab from 11/30/2016 in [facility] Cardiac and Pulmonary Rehab  Referring Provider  Mora Appl MD      Encounter Date: 12/30/2016  Check In: Session Check In - 12/30/16 0752      Check-In   Location  [facility] Re[facility]    Staff Present  Heath Lark, RN, BSN, CCRP;Jessica Luan Pulling, MA, ACSM RCEP, Exercise Physiologist;Other Joellyn Rued, BS, [place]     Supervising physician immediately available to respond to emergencies  See telemetry face sheet for immediately available ER MD    Medication changes reported      No    Fall or balance concerns reported     No    Tobacco Cessation  No Change    Warm-up and Cool-down  Performed on first and last piece of equipment    Resistance Training Performed  Yes    VAD Patient?  No      Pain Assessment   Currently in Pain?  No/denies    Multiple Pain Sites  No          Social History   Tobacco Use  Smoking Status Former Smoker  . Types: Cigarettes  . Last attempt to quit: 01/05/1978  . Years since quitting: 39.0  Smokeless Tobacco Never Used    Goals Met:  Independence with exercise equipment Exercise tolerated well No report of cardiac concerns or symptoms Strength training completed today  Goals Unmet:  Not Applicable  Comments: Pt able to follow exercise prescription today without complaint.  Will continue to monitor for progression.    Dr. Emily Filbert is Medical Director for [place] and [facility].

## 2017-01-01 DIAGNOSIS — Z955 Presence of coronary angioplasty implant and graft: Secondary | ICD-10-CM

## 2017-01-01 NOTE — Progress Notes (Signed)
Daily Session Note  Patient Details  Name: Jacob Avila MRN: [id number] Date of Birth: 06-14-1927 Referring Provider:     Cardiac Rehab from 11/30/2016 in [facility] Cardiac and Pulmonary Rehab  Referring Provider  Mora Appl MD      Encounter Date: 01/01/2017  Check In: Session Check In - 01/01/17 0849      Check-In   Location  [facility]Cardiac [facility]monary Re[facility]    Staff Present  Renita Papa, RN Vickki Hearing, BA, ACSM CEP, Exercise Physiologist;Jessica Luan Pulling, MA, ACSM RCEP, Exercise Physiologist    Supervising physician immediately available to respond to emergencies  See telemetry face sheet for immediately available ER MD    Medication changes reported      No    Fall or balance concerns reported     No    Warm-up and Cool-down  Performed on first and last piece of equipment    Resistance Training Performed  Yes    VAD Patient?  No      Pain Assessment   Currently in Pain?  No/denies    Multiple Pain Sites  No          Social History   Tobacco Use  Smoking Status Former Smoker  . Types: Cigarettes  . Last attempt to quit: 01/05/1978  . Years since quitting: 39.0  Smokeless Tobacco Never Used    Goals Met:  Independence with exercise equipment Exercise tolerated well No report of cardiac concerns or symptoms Strength training completed today  Goals Unmet:  Not Applicable  Comments: Pt able to follow exercise prescription today without complaint.  Will continue to monitor for progression.    Dr. Emily Filbert is Medical Director for [place] and [facility].

## 2017-01-04 ENCOUNTER — Encounter: Payer: Medicare Other | Admitting: *Deleted

## 2017-01-04 DIAGNOSIS — Z955 Presence of coronary angioplasty implant and graft: Secondary | ICD-10-CM

## 2017-01-04 NOTE — Progress Notes (Signed)
Daily Session Note  Patient Details  Name: TERRIN MEDDAUGH MRN: [id number] Date of Birth: December 17, 1927 Referring Provider:     Cardiac Rehab from 11/30/2016 in [facility] Cardiac and Pulmonary Rehab  Referring Provider  Mora Appl MD      Encounter Date: 01/04/2017  Check In: Session Check In - 01/04/17 0809      Check-In   Location  [facility]Cardiac [facility]monary Rehab    Staff Present  Renita Papa, RN BSN;Carroll Enterkin, RN, Levie Heritage, MA, ACSM RCEP, Exercise Physiologist    Supervising physician immediately available to respond to emergencies  See telemetry face sheet for immediately available ER MD    Medication changes reported      No    Fall or balance concerns reported     No    Warm-up and Cool-down  Performed on first and last piece of equipment    Resistance Training Performed  Yes      Pain Assessment   Currently in Pain?  No/denies          Social History   Tobacco Use  Smoking Status Former Smoker  . Types: Cigarettes  . Last attempt to quit: 01/05/1978  . Years since quitting: 39.0  Smokeless Tobacco Never Used    Goals Met:  Independence with exercise equipment Exercise tolerated well No report of cardiac concerns or symptoms Strength training completed today  Goals Unmet:  Not Applicable  Comments: Pt able to follow exercise prescription today without complaint.  Will continue to monitor for progression.    Dr. Emily Filbert is Medical Director for [place] and [facility].

## 2017-01-06 ENCOUNTER — Encounter: Payer: Medicare Other | Attending: Cardiovascular Disease

## 2017-01-06 ENCOUNTER — Encounter: Payer: Self-pay | Admitting: *Deleted

## 2017-01-06 DIAGNOSIS — Z7902 Long term (current) use of antithrombotics/antiplatelets: Secondary | ICD-10-CM | POA: Diagnosis not present

## 2017-01-06 DIAGNOSIS — Z955 Presence of coronary angioplasty implant and graft: Secondary | ICD-10-CM | POA: Insufficient documentation

## 2017-01-06 DIAGNOSIS — Z87891 Personal history of nicotine dependence: Secondary | ICD-10-CM | POA: Insufficient documentation

## 2017-01-06 DIAGNOSIS — E785 Hyperlipidemia, unspecified: Secondary | ICD-10-CM | POA: Insufficient documentation

## 2017-01-06 DIAGNOSIS — I2510 Atherosclerotic heart disease of native coronary artery without angina pectoris: Secondary | ICD-10-CM | POA: Insufficient documentation

## 2017-01-06 DIAGNOSIS — I440 Atrioventricular block, first degree: Secondary | ICD-10-CM | POA: Diagnosis not present

## 2017-01-06 DIAGNOSIS — I10 Essential (primary) hypertension: Secondary | ICD-10-CM | POA: Diagnosis not present

## 2017-01-06 DIAGNOSIS — Z79899 Other long term (current) drug therapy: Secondary | ICD-10-CM | POA: Insufficient documentation

## 2017-01-06 DIAGNOSIS — Z7982 Long term (current) use of aspirin: Secondary | ICD-10-CM | POA: Diagnosis not present

## 2017-01-06 DIAGNOSIS — I4891 Unspecified atrial fibrillation: Secondary | ICD-10-CM | POA: Diagnosis not present

## 2017-01-06 NOTE — Progress Notes (Signed)
Cardiac Individual Treatment Plan  Patient Details  Name: Jacob Avila MRN: [id number] Date of Birth: 1927/06/24 Referring Provider:     Cardiac Rehab from 11/30/2016 in [facility] Cardiac and Pulmonary Rehab  Referring Provider  Mora Appl MD      Initial Encounter Date:    Cardiac Rehab from 11/30/2016 in [facility] Cardiac and Pulmonary Rehab  Date  11/30/16  Referring Provider  Mora Appl MD      Visit Diagnosis: Status post coronary artery stent placement  Patient's Home Medications on Admission:  Current Outpatient Medications:  .  aspirin EC 81 MG tablet, Take 81 mg by mouth., Disp: , Rfl:  .  atorvastatin (LIPITOR) 80 MG tablet, Take 80 mg by mouth., Disp: , Rfl:  .  clopidogrel (PLAVIX) 75 MG tablet, Take 75 mg by mouth., Disp: , Rfl:  .  furosemide (LASIX) 20 MG tablet, Take 40 mg by mouth., Disp: , Rfl:  .  lisinopril (PRINIVIL,ZESTRIL) 2.5 MG tablet, Take 2.5 mg by mouth., Disp: , Rfl:  .  metoprolol succinate (TOPROL-XL) 50 MG 24 hr tablet, Take 50 mg by mouth., Disp: , Rfl:  .  omeprazole (PRILOSEC) 20 MG capsule, Take 20 mg by mouth., Disp: , Rfl:  .  simvastatin (ZOCOR) 40 MG tablet, Take 40 mg by mouth., Disp: , Rfl:   Past Medical History: Past Medical History:  Diagnosis Date  . 1st degree AV block 05/13/2012  . Arteriosclerosis of coronary artery 11/12/2014  . Atrial fibrillation ([place]) 11/30/2016  . BP (high blood pressure) 05/12/2012  . Bradycardia 05/12/2012  . Dizziness 05/12/2012  . HLD (hyperlipidemia)     Tobacco Use: Social History   Tobacco Use  Smoking Status Former Smoker  . Types: Cigarettes  . Last attempt to quit: 01/05/1978  . Years since quitting: 39.0  Smokeless Tobacco Never Used    Labs: Recent Review Flowsheet Data    There is no flowsheet data to display.       Exercise Target Goals:    Exercise Program Goal: Individual exercise prescription set with THRR, safety & activity barriers. Participant demonstrates  ability to understand and report RPE using BORG scale, to self-measure pulse accurately, and to acknowledge the importance of the exercise prescription.  Exercise Prescription Goal: Starting with aerobic activity 30 plus minutes a day, 3 days per week for initial exercise prescription. Provide home exercise prescription and guidelines that participant acknowledges understanding prior to discharge.  Activity Barriers & Risk Stratification: Activity Barriers & Cardiac Risk Stratification - 11/30/16 1413      Activity Barriers & Cardiac Risk Stratification   Activity Barriers  Shortness of Breath;Deconditioning;Muscular Weakness;Balance Concerns    Cardiac Risk Stratification  Moderate       6 Minute Walk: 6 Minute Walk    Row Name 11/30/16 1452         6 Minute Walk   Phase  Initial     Distance  1265 feet     Walk Time  6 minutes     # of Rest Breaks  0     MPH  2.4     METS  2.74     RPE  13     Perceived Dyspnea   1     VO2 Peak  9.6     Symptoms  Yes (comment)     Comments  SOB     Resting HR  109 bpm     Resting BP  116/64     Resting Oxygen  Saturation   99 %     Exercise Oxygen Saturation  during 6 min walk  97 %     Max Ex. HR  152 bpm     Max Ex. BP  160/74     2 Minute Post BP  124/64        Oxygen Initial Assessment:   Oxygen Re-Evaluation:   Oxygen Discharge (Final Oxygen Re-Evaluation):   Initial Exercise Prescription: Initial Exercise Prescription - 11/30/16 1400      Date of Initial Exercise RX and Referring Provider   Date  11/30/16    Referring Provider  Mora Appl MD      Treadmill   MPH  2.3    Grade  0    Minutes  15    METs  2.76      Recumbant Bike   Level  1    RPM  50    Minutes  15    METs  2.5      NuStep   Level  2    SPM  80    Minutes  15    METs  2.5      Prescription Details   Frequency (times per week)  3    Duration  Progress to 45 minutes of aerobic exercise without signs/symptoms of physical distress       Intensity   THRR 40-80% of Max Heartrate  119-129    Ratings of Perceived Exertion  11-13    Perceived Dyspnea  0-4      Progression   Progression  Continue to progress workloads to maintain intensity without signs/symptoms of physical distress.      Resistance Training   Training Prescription  Yes    Weight  3 lbs    Reps  10-15       Perform Capillary Blood Glucose checks as needed.  Exercise Prescription Changes: Exercise Prescription Changes    Row Name 11/30/16 1400 12/15/16 1300 12/16/16 0800 12/30/16 1500       Response to Exercise   Blood Pressure (Admit)  116/64  128/64  -  126/54    Blood Pressure (Exercise)  124/64  128/64  -  128/72    Blood Pressure (Exit)  112/64  124/70  -  126/74    Heart Rate (Admit)  109 bpm  104 bpm  -  98 bpm    Heart Rate (Exercise)  152 bpm  160 bpm  -  147 bpm high heart rate during resistance training    Heart Rate (Exit)  106 bpm  109 bpm  -  105 bpm    Oxygen Saturation (Admit)  99 %  -  -  -    Oxygen Saturation (Exercise)  97 %  -  -  -    Rating of Perceived Exertion (Exercise)  13  15  -  11    Perceived Dyspnea (Exercise)  1  -  -  -    Symptoms  SOB  none  -  none    Comments  walk test resutls  -  -  -    Duration  -  Continue with 45 min of aerobic exercise without signs/symptoms of physical distress.  -  Continue with 45 min of aerobic exercise without signs/symptoms of physical distress.    Intensity  -  THRR unchanged  -  THRR unchanged      Progression   Progression  -  Continue to progress workloads to maintain  intensity without signs/symptoms of physical distress.  -  Continue to progress workloads to maintain intensity without signs/symptoms of physical distress.    Average METs  -  2.62  -  3.01      Resistance Training   Training Prescription  -  Yes  -  Yes    Weight  -  3 lbs  -  4 lbs    Reps  -  10-15  -  10-15      Interval Training   Interval Training  -  No  -  No      Recumbant Bike   Level   -  3  -  4    Watts  -  18  -  25    Minutes  -  15  -  15    METs  -  2.79  -  2.79      NuStep   Level  -  4  -  5    Minutes  -  15  -  15    METs  -  2.1  -  3.4      Track   Laps  -  46  -  40    Minutes  -  15  -  15    METs  -  3.14  -  2.84      Home Exercise Plan   Plans to continue exercise at  -  -  Home (comment) walking  Home (comment) walking    Frequency  -  -  Add 2 additional days to program exercise sessions.  Add 2 additional days to program exercise sessions.    Initial Home Exercises Provided  -  -  12/16/16  12/16/16       Exercise Comments: Exercise Comments    Row Name 12/07/16 0804           Exercise Comments  First full day of exercise!  Patient was oriented to gym and equipment including functions, settings, policies, and procedures.  Patient's individual exercise prescription and treatment plan were reviewed.  All starting workloads were established based on the results of the 6 minute walk test done at initial orientation visit.  The plan for exercise progression was also introduced and progression will be customized based on patient's performance and goals.          Exercise Goals and Review: Exercise Goals    Row Name 11/30/16 1503             Exercise Goals   Increase Physical Activity  Yes       Intervention  Provide advice, education, support and counseling about physical activity/exercise needs.;Develop an individualized exercise prescription for aerobic and resistive training based on initial evaluation findings, risk stratification, comorbidities and participant's personal goals.       Expected Outcomes  Achievement of increased cardiorespiratory fitness and enhanced flexibility, muscular endurance and strength shown through measurements of functional capacity and personal statement of participant.       Increase Strength and Stamina  Yes       Intervention  Provide advice, education, support and counseling about physical  activity/exercise needs.;Develop an individualized exercise prescription for aerobic and resistive training based on initial evaluation findings, risk stratification, comorbidities and participant's personal goals.       Expected Outcomes  Achievement of increased cardiorespiratory fitness and enhanced flexibility, muscular endurance and strength shown through measurements of functional capacity and personal statement of  participant.       Able to understand and use rate of perceived exertion (RPE) scale  Yes       Intervention  Provide education and explanation on how to use RPE scale       Expected Outcomes  Short Term: Able to use RPE daily in rehab to express subjective intensity level;Long Term:  Able to use RPE to guide intensity level when exercising independently       Knowledge and understanding of Target Heart Rate Range (THRR)  Yes       Intervention  Provide education and explanation of THRR including how the numbers were predicted and where they are located for reference       Expected Outcomes  Short Term: Able to state/look up THRR;Long Term: Able to use THRR to govern intensity when exercising independently;Short Term: Able to use daily as guideline for intensity in rehab       Able to check pulse independently  Yes       Intervention  Provide education and demonstration on how to check pulse in carotid and radial arteries.;Review the importance of being able to check your own pulse for safety during independent exercise       Expected Outcomes  Short Term: Able to explain why pulse checking is important during independent exercise;Long Term: Able to check pulse independently and accurately       Understanding of Exercise Prescription  Yes       Intervention  Provide education, explanation, and written materials on patient's individual exercise prescription       Expected Outcomes  Short Term: Able to explain program exercise prescription;Long Term: Able to explain home exercise  prescription to exercise independently          Exercise Goals Re-Evaluation : Exercise Goals Re-Evaluation    Row Name 12/07/16 0805 12/15/16 1309 12/16/16 0816 12/30/16 1512       Exercise Goal Re-Evaluation   Exercise Goals Review  Knowledge and understanding of Target Heart Rate Range (THRR);Able to understand and use rate of perceived exertion (RPE) scale;Understanding of Exercise Prescription  Increase Physical Activity;Increase Strength and Stamina;Understanding of Exercise Prescription  -  Increase Physical Activity;Increase Strength and Stamina;Understanding of Exercise Prescription    Comments  Reviewed RPE scale, THR and program prescription with pt today.  Pt voiced understanding and was given a copy of goals to take home.   Jacob Avila is off to a good start in rehab.  He is already up to 18 watts on the recumbent bike.  We will continue to monitor his progress.   Reviewed home exercise with pt today.  Pt plans to walk at home for exercise.   He will be walking on his off days from exercise.  Reviewed THR, pulse, RPE, sign and symptoms, NTG use, and when to call 911 or MD.  Also discussed weather considerations and indoor options.  Pt voiced understanding.  Jacob Avila has been doing well in rehab.  He has not been walking at much as we talked about but he goes on occasion.  He is up to 25 watts on the recumbent bike and level 5 on the NuStep.  We will continue to monitor his progression.     Expected Outcomes  Short: Use RPE daily to regulate intensity.  Long: Follow program prescription in THR.  Short: Continue to attend regularly and review home exercise.  Long: Continue to follow program prescription.   Short: Add in walking daily as part of routine  to build strength and stamina. Long: Make exercise part of routine.   Short: Continue to try to exercise more at home on off days.  Long: Continue to increase physical activity levels.        Discharge Exercise Prescription (Final Exercise Prescription  Changes): Exercise Prescription Changes - 12/30/16 1500      Response to Exercise   Blood Pressure (Admit)  126/54    Blood Pressure (Exercise)  128/72    Blood Pressure (Exit)  126/74    Heart Rate (Admit)  98 bpm    Heart Rate (Exercise)  147 bpm high heart rate during resistance training    Heart Rate (Exit)  105 bpm    Rating of Perceived Exertion (Exercise)  11    Symptoms  none    Duration  Continue with 45 min of aerobic exercise without signs/symptoms of physical distress.    Intensity  THRR unchanged      Progression   Progression  Continue to progress workloads to maintain intensity without signs/symptoms of physical distress.    Average METs  3.01      Resistance Training   Training Prescription  Yes    Weight  4 lbs    Reps  10-15      Interval Training   Interval Training  No      Recumbant Bike   Level  4    Watts  25    Minutes  15    METs  2.79      NuStep   Level  5    Minutes  15    METs  3.4      Track   Laps  40    Minutes  15    METs  2.84      Home Exercise Plan   Plans to continue exercise at  Home (comment) walking    Frequency  Add 2 additional days to program exercise sessions.    Initial Home Exercises Provided  12/16/16       Nutrition:  Target Goals: Understanding of nutrition guidelines, daily intake of sodium <1590m, cholesterol <2062m calories 30% from fat and 7% or less from saturated fats, daily to have 5 or more servings of fruits and vegetables.  Biometrics: Pre Biometrics - 11/30/16 1504      Pre Biometrics   Height  6' 0.5" (1.842 m)    Weight  221 lb (100.2 kg)    Waist Circumference  39 inches    Hip Circumference  43 inches    Waist to Hip Ratio  0.91 %    BMI (Calculated)  29.54    Single Leg Stand  1.65 seconds        Nutrition Therapy Plan and Nutrition Goals: Nutrition Therapy & Goals - 11/30/16 1409      Intervention Plan   Intervention  Prescribe, educate and counsel regarding individualized  specific dietary modifications aiming towards targeted core components such as weight, hypertension, lipid management, diabetes, heart failure and other comorbidities.;Nutrition handout(s) given to patient.    Expected Outcomes  Short Term Goal: Understand basic principles of dietary content, such as calories, fat, sodium, cholesterol and nutrients.;Short Term Goal: A plan has been developed with personal nutrition goals set during dietitian appointment.;Long Term Goal: Adherence to prescribed nutrition plan.       Nutrition Discharge: Rate Your Plate Scores: Nutrition Assessments - 11/30/16 1409      MEDFICTS Scores   Pre Score  6 Patient states he is  not eating much b/c he has no appetite.       Nutrition Goals Re-Evaluation: Nutrition Goals Re-Evaluation    Row Name 12/16/16 0804             Goals   Nutrition Goal  Meet with Lattie Haw about nutrtition       Comment  Jacob Avila does not want to set up a separate appointment for nutrition.  He is open to meeting during class for some tips to his diet and hearing her suggestions.        Expected Outcome  Short: Meet with Lattie Haw.  Long: Apply some tips to his diet and maintain heart healthy diet.           Nutrition Goals Discharge (Final Nutrition Goals Re-Evaluation): Nutrition Goals Re-Evaluation - 12/16/16 0804      Goals   Nutrition Goal  Meet with Lattie Haw about nutrtition    Comment  Jacob Avila does not want to set up a separate appointment for nutrition.  He is open to meeting during class for some tips to his diet and hearing her suggestions.     Expected Outcome  Short: Meet with Lattie Haw.  Long: Apply some tips to his diet and maintain heart healthy diet.        Psychosocial: Target Goals: Acknowledge presence or absence of significant depression and/or stress, maximize coping skills, provide positive support system. Participant is able to verbalize types and ability to use techniques and skills needed for reducing stress and depression.    Initial Review & Psychosocial Screening: Initial Psych Review & Screening - 11/30/16 1410      Initial Review   Current issues with  Current Sleep Concerns      Family Dynamics   Good Support System?  Yes    Comments  Wife and daughter      Barriers   Psychosocial barriers to participate in program  The patient should benefit from training in stress management and relaxation.      Screening Interventions   Interventions  Yes;Encouraged to exercise;Provide feedback about the scores to participant;Program counselor consult;To provide support and resources with identified psychosocial needs    Expected Outcomes  Short Term goal: Utilizing psychosocial counselor, staff and physician to assist with identification of specific Stressors or current issues interfering with healing process. Setting desired goal for each stressor or current issue identified.;Long Term Goal: Stressors or current issues are controlled or eliminated.;Short Term goal: Identification and review with participant of any Quality of Life or Depression concerns found by scoring the questionnaire.;Long Term goal: The participant improves quality of Life and PHQ9 Scores as seen by post scores and/or verbalization of changes       Quality of Life Scores:  Quality of Life - 11/30/16 1411      Quality of Life Scores   Health/Function Pre  27.17 %    Socioeconomic Pre  30 %    Psych/Spiritual Pre  30 %    Family Pre  30 %    GLOBAL Pre  28.78 %       PHQ-9: Recent Review Flowsheet Data    Depression screen [facility] 2/9 12/07/2016 11/30/2016   Decreased Interest 2 2   Down, Depressed, Hopeless 1 2   PHQ - 2 Score 3 4   Altered sleeping 0 2   Tired, decreased energy 2 3   Change in appetite 3 3   Feeling bad or failure about yourself  0 0   Trouble concentrating 1 2  Moving slowly or fidgety/restless 1 1   Suicidal thoughts 0 0   PHQ-9 Score 10 15   Difficult doing work/chores Somewhat difficult Somewhat difficult      Interpretation of Total Score  Total Score Depression Severity:  1-4 = Minimal depression, 5-9 = Mild depression, 10-14 = Moderate depression, 15-19 = Moderately severe depression, 20-27 = Severe depression   Psychosocial Evaluation and Intervention: Psychosocial Evaluation - 12/07/16 1009      Psychosocial Evaluation & Interventions   Interventions  Stress management education;Relaxation education;Encouraged to exercise with the program and follow exercise prescription    Comments  Counselor met with Mr. Capistran today for initial psychosocial evaluation.  He is an 84 year old who recently had a heart attack and a a stent.  He has a strong support system with a spouse of 70 years; a Corporate investment banker and son who live locally and active involvement in his local church.  Jacob Avila reports being in good health overall; sleeping better and denies symptoms of depression or anxiety.  He states his appetite has been poor for awhile.  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  His goals for this program are to get healthier; with more energy; and to get back to work!  Staff will follow with Mr. Steele.      Expected Outcomes  Jacob Avila will benefit from consistent exercise to achieve his stated goals.  The educational and psychoeducational components of this program will be beneficial is learning more about his condition and coping more positively.  Staff will follow.    Continue Psychosocial Services   Follow up required by staff       Psychosocial Re-Evaluation: Psychosocial Re-Evaluation    [place] Name 12/16/16 0805             Psychosocial Re-Evaluation   Current issues with  Current Stress Concerns       Comments  Jacob Avila has been doing well in rehab. He says class has been hard as he is not used to working this hard. 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  He enjoys working and finds himself to  be more content while driving.  His energy is getting better and he feels ready to back to work.Marland Kitchen  He continues to sleep well.        Expected Outcomes  Short: Talk to doctor about getting back to work.  Long: Continue to enjoy life and maintain positive attitude.        Interventions  Encouraged to attend Cardiac Rehabilitation for the exercise;Stress management education       Continue Psychosocial Services   Follow up required by staff          Psychosocial Discharge (Final Psychosocial Re-Evaluation): Psychosocial Re-Evaluation - 12/16/16 0805      Psychosocial Re-Evaluation   Current issues with  Current Stress Concerns    Comments  Jacob Avila has been doing well in rehab. He says class has been hard as he is not used to working this hard. 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  He enjoys working and finds himself to be more content while driving.  His energy is getting better and he feels ready to back to work.Marland Kitchen  He continues to sleep well.     Expected Outcomes  Short: Talk to doctor about getting  back to work.  Long: Continue to enjoy life and maintain positive attitude.     Interventions  Encouraged to attend Cardiac Rehabilitation for the exercise;Stress management education    Continue Psychosocial Services   Follow up required by staff       Vocational Rehabilitation: Provide vocational rehab assistance to qualifying candidates.   Vocational Rehab Evaluation & Intervention: Vocational Rehab - 11/30/16 1416      Initial Vocational Rehab Evaluation & Intervention   Assessment shows need for Vocational Rehabilitation  No       Education: Education Goals: Education classes will be provided on a variety of topics geared toward better understanding of heart health and risk factor modification. Participant will state understanding/return demonstration of topics presented as noted by education test scores.  Learning Barriers/Preferences: Learning Barriers/Preferences -  11/30/16 1415      Learning Barriers/Preferences   Learning Barriers  Hearing    Learning Preferences  Verbal Instruction;Skilled Demonstration       Education Topics: General Nutrition Guidelines/Fats and Fiber: -Group instruction provided by verbal, written material, models and posters to present the general guidelines for heart healthy nutrition. Gives an explanation and review of dietary fats and fiber.   Cardiac Rehab from 12/30/2016 in [facility] Cardiac and Pulmonary Rehab  Date  12/21/16  Educator  CR  Instruction Review Code  1- Verbalizes Understanding      Controlling Sodium/Reading Food Labels: -Group verbal and written material supporting the discussion of sodium use in heart healthy nutrition. Review and explanation with models, verbal and written materials for utilization of the food label.   Cardiac Rehab from 12/30/2016 in [facility] Cardiac and Pulmonary Rehab  Date  12/21/16  Educator  CR  Instruction Review Code  1- Verbalizes Understanding      Exercise Physiology & Risk Factors: - Group verbal and written instruction with models to review the exercise physiology of the cardiovascular system and associated critical values. Details cardiovascular disease risk factors and the goals associated with each risk factor.   Cardiac Rehab from 12/30/2016 in [facility] Cardiac and Pulmonary Rehab  Date  12/30/16  Educator  [facility]  Instruction Review Code  1- Verbalizes Understanding      Aerobic Exercise & Resistance Training: - Gives group verbal and written discussion on the health impact of inactivity. On the components of aerobic and resistive training programs and the benefits of this training and how to safely progress through these programs.   Flexibility, Balance, General Exercise Guidelines: - Provides group verbal and written instruction on the benefits of flexibility and balance training programs. Provides general exercise guidelines with specific guidelines to those with  heart or lung disease. Demonstration and skill practice provided.   Stress Management: - Provides group verbal and written instruction about the health risks of elevated stress, cause of high stress, and healthy ways to reduce stress.   Depression: - Provides group verbal and written instruction on the correlation between heart/lung disease and depressed mood, treatment options, and the stigmas associated with seeking treatment.   Cardiac Rehab from 12/30/2016 in [facility] Cardiac and Pulmonary Rehab  Date  12/16/16  Educator  [facility]  Instruction Review Code  1- Verbalizes Understanding      Anatomy & Physiology of the Heart: - Group verbal and written instruction and models provide basic cardiac anatomy and physiology, with the coronary electrical and arterial systems. Review of: AMI, Angina, Valve disease, Heart Failure, Cardiac Arrhythmia, Pacemakers, and the ICD.   Cardiac Procedures: - Group verbal  and written instruction to review commonly prescribed medications for heart disease. Reviews the medication, class of the drug, and side effects. Includes the steps to properly store meds and maintain the prescription regimen. (beta blockers and nitrates)   Cardiac Medications I: - Group verbal and written instruction to review commonly prescribed medications for heart disease. Reviews the medication, class of the drug, and side effects. Includes the steps to properly store meds and maintain the prescription regimen.   Cardiac Medications II: -Group verbal and written instruction to review commonly prescribed medications for heart disease. Reviews the medication, class of the drug, and side effects. (all other drug classes)    Go Sex-Intimacy & Heart Disease, Get SMART - Goal Setting: - Group verbal and written instruction through game format to discuss heart disease and the return to sexual intimacy. Provides group verbal and written material to discuss and apply goal setting through the  application of the S.M.A.R.T. Method.   Other Matters of the Heart: - Provides group verbal, written materials and models to describe Heart Failure, Angina, Valve Disease, Peripheral Artery Disease, and Diabetes in the realm of heart disease. Includes description of the disease process and treatment options available to the cardiac patient.   Exercise & Equipment Safety: - Individual verbal instruction and demonstration of equipment use and safety with use of the equipment.   Cardiac Rehab from 12/30/2016 in [facility] Cardiac and Pulmonary Rehab  Date  11/30/16  Educator  KS  Instruction Review Code  1- Verbalizes Understanding      Infection Prevention: - Provides verbal and written material to individual with discussion of infection control including proper hand washing and proper equipment cleaning during exercise session.   Cardiac Rehab from 12/30/2016 in [facility] Cardiac and Pulmonary Rehab  Date  11/30/16  Educator  KS  Instruction Review Code  1- Verbalizes Understanding      Falls Prevention: - Provides verbal and written material to individual with discussion of falls prevention and safety.   Cardiac Rehab from 12/30/2016 in [facility] Cardiac and Pulmonary Rehab  Date  11/30/16  Educator  KS  Instruction Review Code  1- Verbalizes Understanding      Diabetes: - Individual verbal and written instruction to review signs/symptoms of diabetes, desired ranges of glucose level fasting, after meals and with exercise. Acknowledge that pre and post exercise glucose checks will be done for 3 sessions at entry of program.   Other: -Provides group and verbal instruction on various topics (see comments)    Knowledge Questionnaire Score: Knowledge Questionnaire Score - 11/30/16 1415      Knowledge Questionnaire Score   Pre Score  22/28 Reviewed correct answers with patient who verbalized understanding.        Core Components/Risk Factors/Patient Goals at Admission: Personal Goals  and Risk Factors at Admission - 11/30/16 1406      Core Components/Risk Factors/Patient Goals on Admission    Weight Management  Yes;Weight Loss    Intervention  Weight Management: Develop a combined nutrition and exercise program designed to reach desired caloric intake, while maintaining appropriate intake of nutrient and fiber, sodium and fats, and appropriate energy expenditure required for the weight goal.;Weight Management: Provide education and appropriate resources to help participant work on and attain dietary goals. patient states he has lost weight recently without trying due to no appetite and not eating much.     Admit Weight  221 lb (100.2 kg)    Goal Weight: Short Term  215 lb (97.5 kg)  Goal Weight: Long Term  211 lb (95.7 kg)    Expected Outcomes  Long Term: Adherence to nutrition and physical activity/exercise program aimed toward attainment of established weight goal;Understanding recommendations for meals to include 15-35% energy as protein, 25-35% energy from fat, 35-60% energy from carbohydrates, less than 228m of dietary cholesterol, 20-35 gm of total fiber daily;Understanding of distribution of calorie intake throughout the day with the consumption of 4-5 meals/snacks;Short Term: Continue to assess and modify interventions until short term weight is achieved;Weight Loss: Understanding of general recommendations for a balanced deficit meal plan, which promotes 1-2 lb weight loss per week and includes a negative energy balance of [phone] kcal/d    Improve shortness of breath with ADL's  Yes    Intervention  Provide education, individualized exercise plan and daily activity instruction to help decrease symptoms of SOB with activities of daily living.    Expected Outcomes  Short Term: Achieves a reduction of symptoms when performing activities of daily living.    Develop more efficient breathing techniques such as purse lipped breathing and diaphragmatic breathing; and practicing  self-pacing with activity  Yes    Intervention  Provide education, demonstration and support about specific breathing techniuqes utilized for more efficient breathing. Include techniques such as pursed lipped breathing, diaphragmatic breathing and self-pacing activity.    Expected Outcomes  Short Term: Participant will be able to demonstrate and use breathing techniques as needed throughout daily activities.    Heart Failure  Yes    Intervention  Provide a combined exercise and nutrition program that is supplemented with education, support and counseling about heart failure. Directed toward relieving symptoms such as shortness of breath, decreased exercise tolerance, and extremity edema.    Expected Outcomes  Improve functional capacity of life;Short term: Attendance in program 2-3 days a week with increased exercise capacity. Reported lower sodium intake. Reported increased fruit and vegetable intake. Reports medication compliance.;Short term: Daily weights obtained and reported for increase. Utilizing diuretic protocols set by physician.;Long term: Adoption of self-care skills and reduction of barriers for early signs and symptoms recognition and intervention leading to self-care maintenance.    Hypertension  Yes    Intervention  Provide education on lifestyle modifcations including regular physical activity/exercise, weight management, moderate sodium restriction and increased consumption of fresh fruit, vegetables, and low fat dairy, alcohol moderation, and smoking cessation.;Monitor prescription use compliance.    Expected Outcomes  Short Term: Continued assessment and intervention until BP is < 140/933mHG in hypertensive participants. < 130/8070mG in hypertensive participants with diabetes, heart failure or chronic kidney disease.;Long Term: Maintenance of blood pressure at goal levels.    Lipids  Yes    Intervention  Provide education and support for participant on nutrition & aerobic/resistive  exercise along with prescribed medications to achieve LDL <70m65mDL >40mg64m Expected Outcomes  Short Term: Participant states understanding of desired cholesterol values and is compliant with medications prescribed. Participant is following exercise prescription and nutrition guidelines.;Long Term: Cholesterol controlled with medications as prescribed, with individualized exercise RX and with personalized nutrition plan. Value goals: LDL < 70mg,65m > 40 mg.    Stress  Yes    Intervention  Offer individual and/or small group education and counseling on adjustment to heart disease, stress management and health-related lifestyle change. Teach and support self-help strategies.;Refer participants experiencing significant psychosocial distress to appropriate mental health specialists for further evaluation and treatment. When possible, include family members and significant others in education/counseling sessions.  Expected Outcomes  Short Term: Participant demonstrates changes in health-related behavior, relaxation and other stress management skills, ability to obtain effective social support, and compliance with psychotropic medications if prescribed.;Long Term: Emotional wellbeing is indicated by absence of clinically significant psychosocial distress or social isolation.       Core Components/Risk Factors/Patient Goals Review:  Goals and Risk Factor Review    Row Name 12/16/16 0800             Core Components/Risk Factors/Patient Goals Review   Personal Goals Review  Weight Management/Obesity;Heart Failure;Hypertension;Lipids;Improve shortness of breath with ADL's       Review  Jacob Avila is off to a good start.  His weight was up 1/2 lbs today as he was eating during the snow days.  His breathing is already starting to get better.  He has not had symptoms of his heart failure.  He weighs daily and watches his sodium intake.  His  blood pressures have been good and he checks them at home on occasion.   He has not had any problems with his medications.        Expected Outcomes  Short: Continue to work on weight loss and check blood pressures a little more frequently.  Long: Continue to work risk factor modifications.           Core Components/Risk Factors/Patient Goals at Discharge (Final Review):  Goals and Risk Factor Review - 12/16/16 0800      Core Components/Risk Factors/Patient Goals Review   Personal Goals Review  Weight Management/Obesity;Heart Failure;Hypertension;Lipids;Improve shortness of breath with ADL's    Review  Jacob Avila is off to a good start.  His weight was up 1/2 lbs today as he was eating during the snow days.  His breathing is already starting to get better.  He has not had symptoms of his heart failure.  He weighs daily and watches his sodium intake.  His  blood pressures have been good and he checks them at home on occasion.  He has not had any problems with his medications.     Expected Outcomes  Short: Continue to work on weight loss and check blood pressures a little more frequently.  Long: Continue to work risk factor modifications.        ITP Comments: ITP Comments    Row Name 11/30/16 1358 12/09/16 0545 12/16/16 1414 01/06/17 9233     ITP Comments  Medical Review Completed; initial ITP created. Diagnosis Documentation can be found in [place] encounter dated 11/07/2016.  30 day review. Continue with ITP unless directed changes per Medical Director review.   Note sent with EKG strips to Dr Kandis Cocking regarding Rea's heartrate being eleveated above 100 during his visits. Getting as high as 160 during exercise exertion.   30 day review. Continue with ITP unless directed changes per Medical Director review.   No changes per MD after office visit       Comments:

## 2017-01-06 NOTE — Progress Notes (Signed)
Daily Session Note  Patient Details  Name: Jacob Avila MRN: [id number] Date of Birth: 1927-04-18 Referring Provider:     Cardiac Rehab from 11/30/2016 in [facility] Cardiac and Pulmonary Rehab  Referring Provider  Mora Appl MD      Encounter Date: 01/06/2017  Check In: Session Check In - 01/06/17 0740      Check-In   Location  [facility]-Cardiac & Pulmonary Rehab    Staff Present  Justin Mend RCP,RRT,BSRT;Heath Lark, RN, BSN, CCRP;Jessica Luan Pulling, MA, ACSM RCEP, Exercise Physiologist    Supervising physician immediately available to respond to emergencies  See telemetry face sheet for immediately available ER MD    Medication changes reported      No    Fall or balance concerns reported     No    Warm-up and Cool-down  Performed on first and last piece of equipment    Resistance Training Performed  Yes    VAD Patient?  No      Pain Assessment   Currently in Pain?  No/denies          Social History   Tobacco Use  Smoking Status Former Smoker  . Types: Cigarettes  . Last attempt to quit: 01/05/1978  . Years since quitting: 39.0  Smokeless Tobacco Never Used    Goals Met:  Independence with exercise equipment Exercise tolerated well No report of cardiac concerns or symptoms Strength training completed today  Goals Unmet:  Not Applicable  Comments: Pt able to follow exercise prescription today without complaint.  Will continue to monitor for progression.   Dr. Emily Filbert is Medical Director for [place] and [facility].

## 2017-01-08 DIAGNOSIS — Z955 Presence of coronary angioplasty implant and graft: Secondary | ICD-10-CM

## 2017-01-08 NOTE — Progress Notes (Signed)
Daily Session Note  Patient Details  Name: Jacob Avila MRN: [id number] Date of Birth: 05-06-1927 Referring Provider:     Cardiac Rehab from 11/30/2016 in [facility] Cardiac and Pulmonary Rehab  Referring Provider  Mora Appl MD      Encounter Date: 01/08/2017  Check In: Session Check In - 01/08/17 0849      Check-In   Location  [facility]-Cardiac & Pulmonary Rehab    Staff Present  Renita Papa, RN Vickki Hearing, BA, ACSM CEP, Exercise Physiologist;Jessica Luan Pulling, MA, ACSM RCEP, Exercise Physiologist    Supervising physician immediately available to respond to emergencies  See telemetry face sheet for immediately available ER MD    Medication changes reported      No    Fall or balance concerns reported     No    Warm-up and Cool-down  Performed on first and last piece of equipment    Resistance Training Performed  Yes    VAD Patient?  No      Pain Assessment   Currently in Pain?  No/denies    Multiple Pain Sites  No          Social History   Tobacco Use  Smoking Status Former Smoker  . Types: Cigarettes  . Last attempt to quit: 01/05/1978  . Years since quitting: 39.0  Smokeless Tobacco Never Used    Goals Met:  Independence with exercise equipment Exercise tolerated well No report of cardiac concerns or symptoms Strength training completed today  Goals Unmet:  Not Applicable  Comments: Pt able to follow exercise prescription today without complaint.  Will continue to monitor for progression.    Dr. Emily Filbert is Medical Director for [place] and [facility].

## 2017-01-11 ENCOUNTER — Encounter: Payer: Medicare Other | Admitting: *Deleted

## 2017-01-11 DIAGNOSIS — Z955 Presence of coronary angioplasty implant and graft: Secondary | ICD-10-CM | POA: Diagnosis not present

## 2017-01-11 NOTE — Progress Notes (Signed)
Daily Session Note  Patient Details  Name: Jacob Avila MRN: [id number] Date of Birth: [birth year] Referring Provider:     Cardiac Rehab from 11/30/2016 in [facility] Cardiac and Pulmonary Rehab  Referring Provider  Mora Appl MD      Encounter Date: 01/11/2017  Check In: Session Check In - 01/11/17 0902      Check-In   Location  [facility]-Cardiac & Pulmonary Rehab    Staff Present  Earlean Shawl, BS, ACSM CEP, Exercise Physiologist;Krista Frederico Hamman, RN BSN;Jessica Luan Pulling, MA, ACSM RCEP, Exercise Physiologist    Supervising physician immediately available to respond to emergencies  See telemetry face sheet for immediately available ER MD    Medication changes reported      No    Fall or balance concerns reported     No    Tobacco Cessation  No Change    Warm-up and Cool-down  Performed on first and last piece of equipment    Resistance Training Performed  Yes    VAD Patient?  No      Pain Assessment   Currently in Pain?  No/denies    Multiple Pain Sites  No          Social History   Tobacco Use  Smoking Status Former Smoker  . Types: Cigarettes  . Last attempt to quit: 01/05/1978  . Years since quitting: 39.0  Smokeless Tobacco Never Used    Goals Met:  Independence with exercise equipment Exercise tolerated well No report of cardiac concerns or symptoms Strength training completed today  Goals Unmet:  Not Applicable  Comments: Pt able to follow exercise prescription today without complaint.  Will continue to monitor for progression.    Dr. Emily Filbert is Medical Director for [place] and [facility].

## 2017-01-18 ENCOUNTER — Encounter: Payer: Medicare Other | Admitting: *Deleted

## 2017-01-18 DIAGNOSIS — Z955 Presence of coronary angioplasty implant and graft: Secondary | ICD-10-CM

## 2017-01-18 NOTE — Progress Notes (Signed)
Daily Session Note  Patient Details  Name: Jacob Avila MRN: [id number] Date of Birth: April 08, 1927 Referring Provider:     Cardiac Rehab from 11/30/2016 in [facility] Cardiac and Pulmonary Rehab  Referring Provider  Mora Appl MD      Encounter Date: 01/18/2017  Check In: Session Check In - 01/18/17 0746      Check-In   Location  [facility]-Cardiac & Pulmonary Rehab    Staff Present  Joellyn Rued, BS, PEC;Krista [place], RN [place], [place], ACSM CEP, Exercise Physiologist    Supervising physician immediately available to respond to emergencies  See telemetry face sheet for immediately available ER MD    Medication changes reported      No    Fall or balance concerns reported     No    Tobacco Cessation  No Change    Warm-up and Cool-down  Performed on first and last piece of equipment    Resistance Training Performed  Yes    VAD Patient?  No      Pain Assessment   Currently in Pain?  No/denies    Multiple Pain Sites  No          Social History   Tobacco Use  Smoking Status Former Smoker  . Types: Cigarettes  . Last attempt to quit: 01/05/1978  . Years since quitting: 39.0  Smokeless Tobacco Never Used    Goals Met:  Independence with exercise equipment Exercise tolerated well Strength training completed today  Goals Unmet:  Not Applicable  Comments: Pt able to follow exercise prescription today without complaint.  Will continue to monitor for progression. See ITP comment for today.    Dr. Emily Filbert is Medical Director for [place] and [facility].

## 2017-01-22 ENCOUNTER — Encounter: Payer: Medicare Other | Admitting: *Deleted

## 2017-01-22 DIAGNOSIS — Z955 Presence of coronary angioplasty implant and graft: Secondary | ICD-10-CM | POA: Diagnosis not present

## 2017-01-22 NOTE — Progress Notes (Signed)
Daily Session Note  Patient Details  Name: Jacob Avila MRN: [id number] Date of Birth: 04-07-1927 Referring Provider:     Cardiac Rehab from 11/30/2016 in [facility] Cardiac and Pulmonary Rehab  Referring Provider  Mora Appl MD      Encounter Date: 01/22/2017  Check In: Session Check In - 01/22/17 0819      Check-In   Location  [facility]-Cardiac & Pulmonary Rehab    Staff Present  Alberteen Sam, MA, RCEP, CCRP, Exercise Physiologist;Meredith Sherryll Burger, RN Vickki Hearing, BA, ACSM CEP, Exercise Physiologist    Supervising physician immediately available to respond to emergencies  See telemetry face sheet for immediately available ER MD    Medication changes reported      No    Warm-up and Cool-down  Performed on first and last piece of equipment    Resistance Training Performed  Yes    VAD Patient?  No      Pain Assessment   Currently in Pain?  No/denies          Social History   Tobacco Use  Smoking Status Former Smoker  . Types: Cigarettes  . Last attempt to quit: 01/05/1978  . Years since quitting: 39.0  Smokeless Tobacco Never Used    Goals Met:  Independence with exercise equipment Exercise tolerated well Personal goals reviewed No report of cardiac concerns or symptoms Strength training completed today  Goals Unmet:  Not Applicable  Comments: Pt able to follow exercise prescription today without complaint.  Will continue to monitor for progression. See ITP for goal review.    Dr. Emily Filbert is Medical Director for [place] and [facility].

## 2017-01-25 ENCOUNTER — Encounter: Payer: Medicare Other | Admitting: *Deleted

## 2017-01-25 DIAGNOSIS — Z955 Presence of coronary angioplasty implant and graft: Secondary | ICD-10-CM | POA: Diagnosis not present

## 2017-01-25 NOTE — Progress Notes (Signed)
Daily Session Note  Patient Details  Name: Jacob Avila MRN: [id number] Date of Birth: 1927-02-15 Referring Provider:     Cardiac Rehab from 11/30/2016 in [facility] Cardiac and Pulmonary Rehab  Referring Provider  Mora Appl MD      Encounter Date: 01/25/2017  Check In: Session Check In - 01/25/17 0745      Check-In   Location  [facility]-Cardiac & Pulmonary Rehab    Staff Present  Alberteen Sam, MA, RCEP, CCRP, Exercise Physiologist;Kelly Amedeo Plenty, BS, ACSM CEP, Exercise Physiologist;Meredith Sherryll Burger, RN BSN    Supervising physician immediately available to respond to emergencies  See telemetry face sheet for immediately available ER MD    Medication changes reported      No    Fall or balance concerns reported     No    Warm-up and Cool-down  Performed on first and last piece of equipment    Resistance Training Performed  Yes    VAD Patient?  No      Pain Assessment   Currently in Pain?  No/denies          Social History   Tobacco Use  Smoking Status Former Smoker  . Types: Cigarettes  . Last attempt to quit: 01/05/1978  . Years since quitting: 39.0  Smokeless Tobacco Never Used    Goals Met:  Independence with exercise equipment Exercise tolerated well No report of cardiac concerns or symptoms Strength training completed today  Goals Unmet:  Not Applicable  Comments: Jacob Avila mentioned that he was not feeling well today.  He couldn't really pinpoint anything in particular, just not feeling well.  He sat through class without at problem.  Jacob Avila was able to exercise today without a problem, but he just stayed on the NuStep today for the whole time. Pt able to follow exercise prescription today without complaint.  Will continue to monitor for progression.    Dr. Emily Filbert is Medical Director for [place] and [facility].

## 2017-01-26 ENCOUNTER — Telehealth: Payer: Self-pay | Admitting: *Deleted

## 2017-01-26 ENCOUNTER — Encounter: Payer: Self-pay | Admitting: *Deleted

## 2017-01-26 NOTE — Telephone Encounter (Signed)
[facility] in [place] said [place] is being dicharged after having pneumonia  But he is NOT going to have home health but that he was being sent home on home supplemental oxgyen .   I suggested that she ask the MD to have [place] complete Cardiac Rehab since it is basically the same staff in the same gym. After he completes Cardiac Rehab if MD orders it he can go into Pulmonary Rehab.  Need clearance for [place] to return to Cardiac Rehab since he was in the hospital.

## 2017-02-01 ENCOUNTER — Encounter: Payer: Medicare Other | Admitting: *Deleted

## 2017-02-01 DIAGNOSIS — Z955 Presence of coronary angioplasty implant and graft: Secondary | ICD-10-CM

## 2017-02-01 NOTE — Progress Notes (Signed)
2Daily Session Note  Patient Details  Name: Jacob Avila MRN: [id number] Date of Birth: 1927-11-19 Referring Provider:     Cardiac Rehab from 11/30/2016 in [facility] Cardiac and Pulmonary Rehab  Referring Provider  Mora Appl MD      Encounter Date: 02/01/2017  Check In: Session Check In - 02/01/17 0738      Check-In   Location  [facility]-Cardiac & Pulmonary Rehab    Staff Present  Alberteen Sam, MA, RCEP, CCRP, Exercise Physiologist;Kelly Amedeo Plenty, BS, ACSM CEP, Exercise Physiologist;Krista Frederico Hamman, RN BSN    Supervising physician immediately available to respond to emergencies  See telemetry face sheet for immediately available ER MD    Medication changes reported      Yes    Comments  Now on 2 liters of oxygen day and night    Fall or balance concerns reported     No    Warm-up and Cool-down  Performed on first and last piece of equipment    Resistance Training Performed  Yes    VAD Patient?  No      Pain Assessment   Currently in Pain?  No/denies    Multiple Pain Sites  No          Social History   Tobacco Use  Smoking Status Former Smoker  . Types: Cigarettes  . Last attempt to quit: 01/05/1978  . Years since quitting: 39.1  Smokeless Tobacco Never Used    Goals Met:  Proper associated with RPD/PD & O2 Sat Independence with exercise equipment Exercise tolerated well No report of cardiac concerns or symptoms Strength training completed today  Goals Unmet:  Not Applicable  Comments: External genitalia, Bartholin's glands, Skene's glands, urethra and urethral glands are normal. [name] was cleared to return to rehab after his hospitalization.  Documentation found in discharge summary from [facility] visit from ED 01/25/17 in Care Everywhere.  He is now on oxygen therapy for rest and exercise.  Oxygen saturations stayed above 90% during exercise today.    Dr. Emily Filbert is Medical Director for [place] and [facility].

## 2017-02-03 ENCOUNTER — Encounter: Payer: Self-pay | Admitting: *Deleted

## 2017-02-03 VITALS — Ht 72.5 in | Wt 211.0 lb

## 2017-02-03 DIAGNOSIS — Z955 Presence of coronary angioplasty implant and graft: Secondary | ICD-10-CM

## 2017-02-03 NOTE — Progress Notes (Signed)
Cardiac Individual Treatment Plan  Patient Details  Name: NAHUEL WILBERT MRN: [id number] Date of Birth: 06-28-1927 Referring Provider:     Cardiac Rehab from 11/30/2016 in [facility] Cardiac and Pulmonary Rehab  Referring Provider  Mora Appl MD      Initial Encounter Date:    Cardiac Rehab from 11/30/2016 in [facility] Cardiac and Pulmonary Rehab  Date  11/30/16  Referring Provider  Mora Appl MD      Visit Diagnosis: Status post coronary artery stent placement  Patient's Home Medications on Admission:  Current Outpatient Medications:  .  aspirin EC 81 MG tablet, Take 81 mg by mouth., Disp: , Rfl:  .  atorvastatin (LIPITOR) 80 MG tablet, Take 80 mg by mouth., Disp: , Rfl:  .  clopidogrel (PLAVIX) 75 MG tablet, Take 75 mg by mouth., Disp: , Rfl:  .  furosemide (LASIX) 20 MG tablet, Take 40 mg by mouth., Disp: , Rfl:  .  lisinopril (PRINIVIL,ZESTRIL) 2.5 MG tablet, Take 2.5 mg by mouth., Disp: , Rfl:  .  metoprolol succinate (TOPROL-XL) 50 MG 24 hr tablet, Take 50 mg by mouth., Disp: , Rfl:  .  omeprazole (PRILOSEC) 20 MG capsule, Take 20 mg by mouth., Disp: , Rfl:  .  simvastatin (ZOCOR) 40 MG tablet, Take 40 mg by mouth., Disp: , Rfl:   Past Medical History: Past Medical History:  Diagnosis Date  . 1st degree AV block 05/13/2012  . Arteriosclerosis of coronary artery 11/12/2014  . Atrial fibrillation ([place]) 11/30/2016  . BP (high blood pressure) 05/12/2012  . Bradycardia 05/12/2012  . Dizziness 05/12/2012  . HLD (hyperlipidemia)     Tobacco Use: Social History   Tobacco Use  Smoking Status Former Smoker  . Types: Cigarettes  . Last attempt to quit: 01/05/1978  . Years since quitting: 39.1  Smokeless Tobacco Never Used    Labs: Recent Review Flowsheet Data    There is no flowsheet data to display.       Exercise Target Goals:    Exercise Program Goal: Individual exercise prescription set using results from initial 6 min walk test and THRR while  considering  patient's activity barriers and safety.   Exercise Prescription Goal: Initial exercise prescription builds to 30-45 minutes a day of aerobic activity, 2-3 days per week.  Home exercise guidelines will be given to patient during program as part of exercise prescription that the participant will acknowledge.  Activity Barriers & Risk Stratification: Activity Barriers & Cardiac Risk Stratification - 11/30/16 1413      Activity Barriers & Cardiac Risk Stratification   Activity Barriers  Shortness of Breath;Deconditioning;Muscular Weakness;Balance Concerns    Cardiac Risk Stratification  Moderate       6 Minute Walk: 6 Minute Walk    Row Name 11/30/16 1452         6 Minute Walk   Phase  Initial     Distance  1265 feet     Walk Time  6 minutes     # of Rest Breaks  0     MPH  2.4     METS  2.74     RPE  13     Perceived Dyspnea   1     VO2 Peak  9.6     Symptoms  Yes (comment)     Comments  SOB     Resting HR  109 bpm     Resting BP  116/64     Resting Oxygen Saturation   99 %  Exercise Oxygen Saturation  during 6 min walk  97 %     Max Ex. HR  152 bpm     Max Ex. BP  160/74     2 Minute Post BP  124/64        Oxygen Initial Assessment: Oxygen Initial Assessment - 02/01/17 0858      Home Oxygen   Home Oxygen Device  Home Concentrator;E-Tanks    Sleep Oxygen Prescription  Continuous    Liters per minute  2    Home Exercise Oxygen Prescription  Continuous    Liters per minute  2    Home at Rest Exercise Oxygen Prescription  Continuous    Liters per minute  2    Compliance with Home Oxygen Use  Yes      Program Oxygen Prescription   Program Oxygen Prescription  Continuous;E-Tanks    Liters per minute  2      Intervention   Short Term Goals  To learn and exhibit compliance with exercise, home and travel O2 prescription;To learn and understand importance of maintaining oxygen saturations>88%;To learn and understand importance of monitoring SPO2 with  pulse oximeter and demonstrate accurate use of the pulse oximeter.;To learn and demonstrate proper pursed lip breathing techniques or other breathing techniques.    Long  Term Goals  Maintenance of O2 saturations>88%;Exhibits compliance with exercise, home and travel O2 prescription;Verbalizes importance of monitoring SPO2 with pulse oximeter and return demonstration;Exhibits proper breathing techniques, such as pursed lip breathing or other method taught during program session       Oxygen Re-Evaluation: Oxygen Re-Evaluation    Row Name 02/01/17 0859             Program Oxygen Prescription   Program Oxygen Prescription  Continuous;E-Tanks       Liters per minute  2         Home Oxygen   Home Oxygen Device  Home Concentrator;E-Tanks       Sleep Oxygen Prescription  Continuous       Liters per minute  2       Home Exercise Oxygen Prescription  Continuous       Liters per minute  2       Home at Rest Exercise Oxygen Prescription  Continuous       Liters per minute  2       Compliance with Home Oxygen Use  Yes         Goals/Expected Outcomes   Short Term Goals  To learn and exhibit compliance with exercise, home and travel O2 prescription;To learn and understand importance of maintaining oxygen saturations>88%;To learn and understand importance of monitoring SPO2 with pulse oximeter and demonstrate accurate use of the pulse oximeter.;To learn and demonstrate proper pursed lip breathing techniques or other breathing techniques.       Long  Term Goals  Maintenance of O2 saturations>88%;Exhibits compliance with exercise, home and travel O2 prescription;Verbalizes importance of monitoring SPO2 with pulse oximeter and return demonstration;Exhibits proper breathing techniques, such as pursed lip breathing or other method taught during program session       Comments  Reviewed PLB technique with pt.  Talked about how it work and it's important to maintaining his exercise saturations.          Goals/Expected Outcomes  Short: Become more profiecient at using PLB.   Long: Become independent at using PLB.          Oxygen Discharge (Final Oxygen Re-Evaluation): Oxygen Re-Evaluation -  02/01/17 0859      Program Oxygen Prescription   Program Oxygen Prescription  Continuous;E-Tanks    Liters per minute  2      Home Oxygen   Home Oxygen Device  Home Concentrator;E-Tanks    Sleep Oxygen Prescription  Continuous    Liters per minute  2    Home Exercise Oxygen Prescription  Continuous    Liters per minute  2    Home at Rest Exercise Oxygen Prescription  Continuous    Liters per minute  2    Compliance with Home Oxygen Use  Yes      Goals/Expected Outcomes   Short Term Goals  To learn and exhibit compliance with exercise, home and travel O2 prescription;To learn and understand importance of maintaining oxygen saturations>88%;To learn and understand importance of monitoring SPO2 with pulse oximeter and demonstrate accurate use of the pulse oximeter.;To learn and demonstrate proper pursed lip breathing techniques or other breathing techniques.    Long  Term Goals  Maintenance of O2 saturations>88%;Exhibits compliance with exercise, home and travel O2 prescription;Verbalizes importance of monitoring SPO2 with pulse oximeter and return demonstration;Exhibits proper breathing techniques, such as pursed lip breathing or other method taught during program session    Comments  Reviewed PLB technique with pt.  Talked about how it work and it's important to maintaining his exercise saturations.      Goals/Expected Outcomes  Short: Become more profiecient at using PLB.   Long: Become independent at using PLB.       Initial Exercise Prescription: Initial Exercise Prescription - 11/30/16 1400      Date of Initial Exercise RX and Referring Provider   Date  11/30/16    Referring Provider  Mora Appl MD      Treadmill   MPH  2.3    Grade  0    Minutes  15    METs  2.76       Recumbant Bike   Level  1    RPM  50    Minutes  15    METs  2.5      NuStep   Level  2    SPM  80    Minutes  15    METs  2.5      Prescription Details   Frequency (times per week)  3    Duration  Progress to 45 minutes of aerobic exercise without signs/symptoms of physical distress      Intensity   THRR 40-80% of Max Heartrate  119-129    Ratings of Perceived Exertion  11-13    Perceived Dyspnea  0-4      Progression   Progression  Continue to progress workloads to maintain intensity without signs/symptoms of physical distress.      Resistance Training   Training Prescription  Yes    Weight  3 lbs    Reps  10-15       Perform Capillary Blood Glucose checks as needed.  Exercise Prescription Changes: Exercise Prescription Changes    Row Name 11/30/16 1400 12/15/16 1300 12/16/16 0800 12/30/16 1500 01/13/17 1500     Response to Exercise   Blood Pressure (Admit)  116/64  128/64  -  126/54  124/82   Blood Pressure (Exercise)  124/64  128/64  -  128/72  116/74   Blood Pressure (Exit)  112/64  124/70  -  126/74  128/70   Heart Rate (Admit)  109 bpm  104 bpm  -  98 bpm  111 bpm   Heart Rate (Exercise)  152 bpm  160 bpm  -  147 bpm high heart rate during resistance training  100 bpm   Heart Rate (Exit)  106 bpm  109 bpm  -  105 bpm  118 bpm   Oxygen Saturation (Admit)  99 %  -  -  -  -   Oxygen Saturation (Exercise)  97 %  -  -  -  -   Rating of Perceived Exertion (Exercise)  13  15  -  11  11   Perceived Dyspnea (Exercise)  1  -  -  -  -   Symptoms  SOB  none  -  none  none   Comments  walk test resutls  -  -  -  -   Duration  -  Continue with 45 min of aerobic exercise without signs/symptoms of physical distress.  -  Continue with 45 min of aerobic exercise without signs/symptoms of physical distress.  Continue with 45 min of aerobic exercise without signs/symptoms of physical distress.   Intensity  -  THRR unchanged  -  THRR unchanged  THRR unchanged     Progression    Progression  -  Continue to progress workloads to maintain intensity without signs/symptoms of physical distress.  -  Continue to progress workloads to maintain intensity without signs/symptoms of physical distress.  Continue to progress workloads to maintain intensity without signs/symptoms of physical distress.   Average METs  -  2.62  -  3.01  3.27     Resistance Training   Training Prescription  -  Yes  -  Yes  Yes   Weight  -  3 lbs  -  4 lbs  4 lbs   Reps  -  10-15  -  10-15  10-15     Interval Training   Interval Training  -  No  -  No  No     Recumbant Bike   Level  -  3  -  4  4   Watts  -  18  -  25  18   Minutes  -  15  -  15  15   METs  -  2.79  -  2.79  2.79     NuStep   Level  -  4  -  5  6   Minutes  -  15  -  15  15   METs  -  2.1  -  3.4  3.9     Track   Laps  -  46  -  40  46   Minutes  -  15  -  15  15   METs  -  3.14  -  2.84  3.14     Home Exercise Plan   Plans to continue exercise at  -  -  Home (comment) walking  Home (comment) walking  Home (comment) walking   Frequency  -  -  Add 2 additional days to program exercise sessions.  Add 2 additional days to program exercise sessions.  Add 2 additional days to program exercise sessions.   Initial Home Exercises Provided  -  -  12/16/16  12/16/16  12/16/16   Row Name 01/25/17 1500             Response to Exercise   Blood Pressure (Admit)  114/60       Blood Pressure (  Exercise)  112/60       Blood Pressure (Exit)  118/60       Heart Rate (Admit)  124 bpm       Heart Rate (Exercise)  134 bpm       Heart Rate (Exit)  112 bpm       Rating of Perceived Exertion (Exercise)  13       Symptoms  none       Duration  Continue with 45 min of aerobic exercise without signs/symptoms of physical distress.       Intensity  THRR unchanged         Progression   Progression  Continue to progress workloads to maintain intensity without signs/symptoms of physical distress.       Average METs  2.63         Resistance  Training   Training Prescription  Yes       Weight  4 lbs       Reps  10-15         Interval Training   Interval Training  No         Recumbant Bike   Level  4       Watts  19       Minutes  15       METs  2.79         NuStep   Level  6       Minutes  15       METs  2.9         Track   Laps  30       Minutes  15       METs  2.38         Home Exercise Plan   Plans to continue exercise at  Home (comment) walking       Frequency  Add 2 additional days to program exercise sessions.       Initial Home Exercises Provided  12/16/16          Exercise Comments: Exercise Comments    Row Name 12/07/16 0804 01/25/17 0934         Exercise Comments  First full day of exercise!  Patient was oriented to gym and equipment including functions, settings, policies, and procedures.  Patient's individual exercise prescription and treatment plan were reviewed.  All starting workloads were established based on the results of the 6 minute walk test done at initial orientation visit.  The plan for exercise progression was also introduced and progression will be customized based on patient's performance and goals.  [name] mentioned that he was not feeling well today.  He couldn't really pinpoint anything in particular, just not feeling well.  He sat through class without at problem.  [name] was able to exercise today without a problem, but he just stayed on the NuStep today for the whole time.          Exercise Goals and Review: Exercise Goals    Row Name 11/30/16 1503             Exercise Goals   Increase Physical Activity  Yes       Intervention  Provide advice, education, support and counseling about physical activity/exercise needs.;Develop an individualized exercise prescription for aerobic and resistive training based on initial evaluation findings, risk stratification, comorbidities and participant's personal goals.       Expected Outcomes  Achievement of increased cardiorespiratory fitness  and enhanced flexibility,  muscular endurance and strength shown through measurements of functional capacity and personal statement of participant.       Increase Strength and Stamina  Yes       Intervention  Provide advice, education, support and counseling about physical activity/exercise needs.;Develop an individualized exercise prescription for aerobic and resistive training based on initial evaluation findings, risk stratification, comorbidities and participant's personal goals.       Expected Outcomes  Achievement of increased cardiorespiratory fitness and enhanced flexibility, muscular endurance and strength shown through measurements of functional capacity and personal statement of participant.       Able to understand and use rate of perceived exertion (RPE) scale  Yes       Intervention  Provide education and explanation on how to use RPE scale       Expected Outcomes  Short Term: Able to use RPE daily in rehab to express subjective intensity level;Long Term:  Able to use RPE to guide intensity level when exercising independently       Knowledge and understanding of Target Heart Rate Range (THRR)  Yes       Intervention  Provide education and explanation of THRR including how the numbers were predicted and where they are located for reference       Expected Outcomes  Short Term: Able to state/look up THRR;Long Term: Able to use THRR to govern intensity when exercising independently;Short Term: Able to use daily as guideline for intensity in rehab       Able to check pulse independently  Yes       Intervention  Provide education and demonstration on how to check pulse in carotid and radial arteries.;Review the importance of being able to check your own pulse for safety during independent exercise       Expected Outcomes  Short Term: Able to explain why pulse checking is important during independent exercise;Long Term: Able to check pulse independently and accurately       Understanding of  Exercise Prescription  Yes       Intervention  Provide education, explanation, and written materials on patient's individual exercise prescription       Expected Outcomes  Short Term: Able to explain program exercise prescription;Long Term: Able to explain home exercise prescription to exercise independently          Exercise Goals Re-Evaluation : Exercise Goals Re-Evaluation    Row Name 12/07/16 0805 12/15/16 1309 12/16/16 0816 12/30/16 1512 01/13/17 1536     Exercise Goal Re-Evaluation   Exercise Goals Review  Knowledge and understanding of Target Heart Rate Range (THRR);Able to understand and use rate of perceived exertion (RPE) scale;Understanding of Exercise Prescription  Increase Physical Activity;Increase Strength and Stamina;Understanding of Exercise Prescription  -  Increase Physical Activity;Increase Strength and Stamina;Understanding of Exercise Prescription  Increase Physical Activity;Increase Strength and Stamina;Understanding of Exercise Prescription   Comments  Reviewed RPE scale, THR and program prescription with pt today.  Pt voiced understanding and was given a copy of goals to take home.   [name] is off to a good start in rehab.  He is already up to 18 watts on the recumbent bike.  We will continue to monitor his progress.   Reviewed home exercise with pt today.  Pt plans to walk at home for exercise.   He will be walking on his off days from exercise.  Reviewed THR, pulse, RPE, sign and symptoms, NTG use, and when to call 911 or MD.  Also discussed weather considerations  and indoor options.  Pt voiced understanding.  [name] has been doing well in rehab.  He has not been walking at much as we talked about but he goes on occasion.  He is up to 25 watts on the recumbent bike and level 5 on the NuStep.  We will continue to monitor his progression.   [name] continues to do well in rehab.  He was out today for a [place] appointment.  He continues to not walk at home.  He is up to level 6 now on the  NuStep.  We will continue to encourage him to walk at home and to monitor his progression.    Expected Outcomes  Short: Use RPE daily to regulate intensity.  Long: Follow program prescription in THR.  Short: Continue to attend regularly and review home exercise.  Long: Continue to follow program prescription.   Short: Add in walking daily as part of routine to build strength and stamina. Long: Make exercise part of routine.   Short: Continue to try to exercise more at home on off days.  Long: Continue to increase physical activity levels.   Short: Continue to ty increase watts on recumbent bike.  Long: Continue to walk more at home.    [place] Name 01/22/17 0800 01/25/17 1542           Exercise Goal Re-Evaluation   Exercise Goals Review  Increase Physical Activity;Increase Strength and Stamina;Understanding of Exercise Prescription  Increase Physical Activity;Increase Strength and Stamina;Understanding of Exercise Prescription      Comments  [name] has been doing well in rehab.  He has only been doing some of his exercise at home.  He stays active but not actually taking the time to go for his walk.  He said that it would help if it had more incentive to do his walking.  He says he lacks a appetitie, maybe more exercise can help.  He is open to trying it out.   [name] has continued to do well in rehab.  Today, he did not feel as good so he stayed on the NuStep the whole time.  He has worked upto 19 watts on the recumbent bike.  We will continue to monitor his progression.       Expected Outcomes  Short: Walk more at home to help with appetite.  Long: Continue to work on IT sales professional.  Short: Continue to try to increase number of laps walking back to where he was before.  Long: Continue to exercise more to work on strength and stamina.          Discharge Exercise Prescription (Final Exercise Prescription Changes): Exercise Prescription Changes - 01/25/17 1500      Response to Exercise   Blood Pressure  (Admit)  114/60    Blood Pressure (Exercise)  112/60    Blood Pressure (Exit)  118/60    Heart Rate (Admit)  124 bpm    Heart Rate (Exercise)  134 bpm    Heart Rate (Exit)  112 bpm    Rating of Perceived Exertion (Exercise)  13    Symptoms  none    Duration  Continue with 45 min of aerobic exercise without signs/symptoms of physical distress.    Intensity  THRR unchanged      Progression   Progression  Continue to progress workloads to maintain intensity without signs/symptoms of physical distress.    Average METs  2.63      Resistance Training   Training Prescription  Yes  Weight  4 lbs    Reps  10-15      Interval Training   Interval Training  No      Recumbant Bike   Level  4    Watts  19    Minutes  15    METs  2.79      NuStep   Level  6    Minutes  15    METs  2.9      Track   Laps  30    Minutes  15    METs  2.38      Home Exercise Plan   Plans to continue exercise at  Home (comment) walking    Frequency  Add 2 additional days to program exercise sessions.    Initial Home Exercises Provided  12/16/16       Nutrition:  Target Goals: Understanding of nutrition guidelines, daily intake of sodium <1551m, cholesterol <2076m calories 30% from fat and 7% or less from saturated fats, daily to have 5 or more servings of fruits and vegetables.  Biometrics: Pre Biometrics - 11/30/16 1504      Pre Biometrics   Height  6' 0.5" (1.842 m)    Weight  221 lb (100.2 kg)    Waist Circumference  39 inches    Hip Circumference  43 inches    Waist to Hip Ratio  0.91 %    BMI (Calculated)  29.54    Single Leg Stand  1.65 seconds        Nutrition Therapy Plan and Nutrition Goals: Nutrition Therapy & Goals - 11/30/16 1409      Intervention Plan   Intervention  Prescribe, educate and counsel regarding individualized specific dietary modifications aiming towards targeted core components such as weight, hypertension, lipid management, diabetes, heart failure and  other comorbidities.;Nutrition handout(s) given to patient.    Expected Outcomes  Short Term Goal: Understand basic principles of dietary content, such as calories, fat, sodium, cholesterol and nutrients.;Short Term Goal: A plan has been developed with personal nutrition goals set during dietitian appointment.;Long Term Goal: Adherence to prescribed nutrition plan.       Nutrition Assessments: Nutrition Assessments - 11/30/16 1409      MEDFICTS Scores   Pre Score  6 Patient states he is not eating much b/c he has no appetite.       Nutrition Goals Re-Evaluation: Nutrition Goals Re-Evaluation    Row Name 12/16/16 0804 01/22/17 [id number]         Goals   Current Weight  -  219 lb (99.3 kg)      Nutrition Goal  Meet with LiLattie Hawbout nutrtition  Meet with LiLattie Hawo talk about appetitie loss and how to kick it up a notch.  Also would like to continue to lose weight.       Comment  RaDwyane Luooes not want to set up a separate appointment for nutrition.  He is open to meeting during class for some tips to his diet and hearing her suggestions.   RaDwyane Luoissed his appointmet with nutrition.  We will reschedule to happen during class on Jan 30.      Expected Outcome  Short: Meet with LiLattie Haw Long: Apply some tips to his diet and maintain heart healthy diet.   Short: Meet with LiLattie Haw Long: Apply some tips to his diet and maintain heart healthy diet.          Nutrition Goals Discharge (Final Nutrition Goals Re-Evaluation):  Nutrition Goals Re-Evaluation - 01/22/17 0808      Goals   Current Weight  219 lb (99.3 kg)    Nutrition Goal  Meet with Lattie Haw to talk about appetitie loss and how to kick it up a notch.  Also would like to continue to lose weight.     Comment  [name] missed his appointmet with nutrition.  We will reschedule to happen during class on Jan 30.    Expected Outcome  Short: Meet with Lattie Haw.  Long: Apply some tips to his diet and maintain heart healthy diet.        Psychosocial: Target Goals:  Acknowledge presence or absence of significant depression and/or stress, maximize coping skills, provide positive support system. Participant is able to verbalize types and ability to use techniques and skills needed for reducing stress and depression.   Initial Review & Psychosocial Screening: Initial Psych Review & Screening - 11/30/16 1410      Initial Review   Current issues with  Current Sleep Concerns      Family Dynamics   Good Support System?  Yes    Comments  Wife and daughter      Barriers   Psychosocial barriers to participate in program  The patient should benefit from training in stress management and relaxation.      Screening Interventions   Interventions  Yes;Encouraged to exercise;Provide feedback about the scores to participant;Program counselor consult;To provide support and resources with identified psychosocial needs    Expected Outcomes  Short Term goal: Utilizing psychosocial counselor, staff and physician to assist with identification of specific Stressors or current issues interfering with healing process. Setting desired goal for each stressor or current issue identified.;Long Term Goal: Stressors or current issues are controlled or eliminated.;Short Term goal: Identification and review with participant of any Quality of Life or Depression concerns found by scoring the questionnaire.;Long Term goal: The participant improves quality of Life and PHQ9 Scores as seen by post scores and/or verbalization of changes       Quality of Life Scores:  Quality of Life - 11/30/16 1411      Quality of Life Scores   Health/Function Pre  27.17 %    Socioeconomic Pre  30 %    Psych/Spiritual Pre  30 %    Family Pre  30 %    GLOBAL Pre  28.78 %      Scores of 19 and below usually indicate a poorer quality of life in these areas.  A difference of  2-3 points is a clinically meaningful difference.  A difference of 2-3 points in the total score of the Quality of Life Index has  been associated with significant improvement in overall quality of life, self-image, physical symptoms, and general health in studies assessing change in quality of life.  PHQ-9: Recent Review Flowsheet Data    Depression screen [facility] 2/9 01/25/2017 12/07/2016 11/30/2016   Decreased Interest 2 2 2    Down, Depressed, Hopeless 1 1 2    PHQ - 2 Score 3 3 4    Altered sleeping 1 0 2   Tired, decreased energy 2 2 3    Change in appetite 3 3 3    Feeling bad or failure about yourself  1 0 0   Trouble concentrating 0 1 2   Moving slowly or fidgety/restless 0 1 1   Suicidal thoughts 1 0 0   PHQ-9 Score 11 10 15    Difficult doing work/chores Somewhat difficult Somewhat difficult Somewhat difficult  Interpretation of Total Score  Total Score Depression Severity:  1-4 = Minimal depression, 5-9 = Mild depression, 10-14 = Moderate depression, 15-19 = Moderately severe depression, 20-27 = Severe depression   Psychosocial Evaluation and Intervention: Psychosocial Evaluation - 12/07/16 1009      Psychosocial Evaluation & Interventions   Interventions  Stress management education;Relaxation education;Encouraged to exercise with the program and follow exercise prescription    Comments  Counselor met with Mr. Buffalo today for initial psychosocial evaluation.  He is an 84 year old who recently had a heart attack and a a stent.  He has a strong support system with a spouse of 85 years; a Corporate investment banker and son who live locally and active involvement in his local church.  [name] reports being in good health overall; sleeping better and denies symptoms of depression or anxiety.  He states his appetite has been poor for awhile.  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  His goals for this program are to get healthier; with more energy; and to get back to work!  Staff will follow with Mr. Pounders.      Expected  Outcomes  [name] will benefit from consistent exercise to achieve his stated goals.  The educational and psychoeducational components of this program will be beneficial is learning more about his condition and coping more positively.  Staff will follow.    Continue Psychosocial Services   Follow up required by staff       Psychosocial Re-Evaluation: Psychosocial Re-Evaluation    Row Name 12/16/16 0805 01/22/17 1093 01/25/17 0916         Psychosocial Re-Evaluation   Current issues with  Current Stress Concerns  Current Stress Concerns  Current Stress Concerns     Comments  [name] has been doing well in rehab. He says class has been hard as he is not used to working this hard. 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  He enjoys working and finds himself to be more content while driving.  His energy is getting better and he feels ready to back to work.Marland Kitchen  He continues to sleep well.   [name] has continued to do well in rehab.  He still would like to go back to work, but he is waiting for his doctor to clear him to return.  He spoke with them yesterday and is awaiting a response.  Overall, he is feeling better but he does have days where he doesn't feel 100% still.  He is postivie and has a strong support system.  He has also been sleeping better.  Biggest benefit of rehab has been getting stronger as he was feeling weak.  Also if he could eat better that would help to improve his strength too.   Counselor follow up with [name] today reporting continues to have appetite problems which are impacting his energy level and mood to some degree.  He states this has been an ongoing concern since his recent cardiac issue wherein he has to "force food."  He is losing a little weight but nothing significant.  [name] has an appointment to meet with the Dietician later this month re: this.  counselor reviewed Rae's PHQ-9 depression inventory which increased "1" point from a "10" initially to an "11," indicating [name] may be  moderately depressed.  He also stated he has "thoughts" of  self harm sometimes and counselor assessed this with [name] reporting no plan - but because he feels so badly at times with lack of energy and appetite and some sleep issues, these thoughts do occur on occasion.  [name] also reports he has tried to reach his Dr. to discuss this but is having difficulty connecting.  Counselor and staff will follow with [name] through the dietician's meeting and recommendations.  Counselor also recommended that [name] continue to try to reach his Dr. to report these concerns.  He plans to consult with his spouse and his daughter for concensus on how to move forward on these suggestions.       Expected Outcomes  Short: Talk to doctor about getting back to work.  Long: Continue to enjoy life and maintain positive attitude.   Short: Hear back about going back to work.  Long: Continue to maintain his positive attitude.   [name] will meet with the dietician about his appetite issues and will continue to try to reach his Cardiologist as well.  [name] will continue to exercise to increase his stamina and energy levels.  Staff will continue to follow with [name].     Interventions  Encouraged to attend Cardiac Rehabilitation for the exercise;Stress management education  -  Stress management education     Continue Psychosocial Services   Follow up required by staff  -  Follow up required by staff     Comments  -  [name] still would like to go back to work if they let him.  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   -       Initial Review   Source of Stress Concerns  -  Occupation  -        Psychosocial Discharge (Final Psychosocial Re-Evaluation): Psychosocial Re-Evaluation - 01/25/17 0916      Psychosocial Re-Evaluation   Current issues with  Current Stress Concerns    Comments  Counselor follow up with [name] today reporting continues to have appetite problems which are impacting his energy level and mood to some degree.  He states this  has been an ongoing concern since his recent cardiac issue wherein he has to "force food."  He is losing a little weight but nothing significant.  [name] has an appointment to meet with the Dietician later this month re: this.  counselor reviewed Rae's PHQ-9 depression inventory which increased "1" point from a "10" initially to an "11," indicating [name] may be moderately depressed.  He also stated he has "thoughts" of self harm sometimes and counselor assessed this with [name] reporting no plan - but because he feels so badly at times with lack of energy and appetite and some sleep issues, these thoughts do occur on occasion.  [name] also reports he has tried to reach his Dr. to discuss this but is having difficulty connecting.  Counselor and staff will follow with [name] through the dietician's meeting and recommendations.  Counselor also recommended that [name] continue to try to reach his Dr. to report these concerns.  He plans to consult with his spouse and his daughter for concensus on how to move forward on these suggestions.      Expected Outcomes  [name] will meet with the dietician about his appetite issues and will continue to try to reach his Cardiologist as well.  [name] will continue to exercise to increase his stamina and energy levels.  Staff will continue to follow with [name].    Interventions  Stress  management education    Continue Psychosocial Services   Follow up required by staff       Vocational Rehabilitation: Provide vocational rehab assistance to qualifying candidates.   Vocational Rehab Evaluation & Intervention: Vocational Rehab - 11/30/16 1416      Initial Vocational Rehab Evaluation & Intervention   Assessment shows need for Vocational Rehabilitation  No       Education: Education Goals: Education classes will be provided on a variety of topics geared toward better understanding of heart health and risk factor modification. Participant will state understanding/return demonstration of topics  presented as noted by education test scores.  Learning Barriers/Preferences: Learning Barriers/Preferences - 11/30/16 1415      Learning Barriers/Preferences   Learning Barriers  Hearing    Learning Preferences  Verbal Instruction;Skilled Demonstration       Education Topics:  AED/CPR: - Group verbal and written instruction with the use of models to demonstrate the basic use of the AED with the basic ABC's of resuscitation.   General Nutrition Guidelines/Fats and Fiber: -Group instruction provided by verbal, written material, models and posters to present the general guidelines for heart healthy nutrition. Gives an explanation and review of dietary fats and fiber.   Cardiac Rehab from 02/01/2017 in [facility] Cardiac and Pulmonary Rehab  Date  02/01/17  Educator  CR  Instruction Review Code  1- Verbalizes Understanding      Controlling Sodium/Reading Food Labels: -Group verbal and written material supporting the discussion of sodium use in heart healthy nutrition. Review and explanation with models, verbal and written materials for utilization of the food label.   Cardiac Rehab from 02/01/2017 in [facility] Cardiac and Pulmonary Rehab  Date  12/21/16  Educator  CR  Instruction Review Code  1- Verbalizes Understanding      Exercise Physiology & General Exercise Guidelines: - Group verbal and written instruction with models to review the exercise physiology of the cardiovascular system and associated critical values. Provides general exercise guidelines with specific guidelines to those with heart or lung disease.    Cardiac Rehab from 02/01/2017 in [facility] Cardiac and Pulmonary Rehab  Date  12/30/16  Educator  [facility]  Instruction Review Code  1- Verbalizes Understanding      Aerobic Exercise & Resistance Training: - Gives group verbal and written instruction on the various components of exercise. Focuses on aerobic and resistive training programs and the benefits of this training and how to  safely progress through these programs..   Cardiac Rehab from 02/01/2017 in [facility] Cardiac and Pulmonary Rehab  Date  01/06/17  Educator  [facility]  Instruction Review Code  1- Verbalizes Understanding      Flexibility, Balance, Mind/Body Relaxation: Provides group verbal/written instruction on the benefits of flexibility and balance training, including mind/body exercise modes such as yoga, pilates and tai chi.  Demonstration and skill practice provided.   Cardiac Rehab from 02/01/2017 in [facility] Cardiac and Pulmonary Rehab  Date  01/11/17  Educator  [facility]  Instruction Review Code  1- Verbalizes Understanding      Stress and Anxiety: - Provides group verbal and written instruction about the health risks of elevated stress and causes of high stress.  Discuss the correlation between heart/lung disease and anxiety and treatment options. Review healthy ways to manage with stress and anxiety.   Depression: - Provides group verbal and written instruction on the correlation between heart/lung disease and depressed mood, treatment options, and the stigmas associated with seeking treatment.   Cardiac Rehab from 02/01/2017  in [facility] Cardiac and Pulmonary Rehab  Date  12/16/16  Educator  [facility]  Instruction Review Code  1- Verbalizes Understanding      Anatomy & Physiology of the Heart: - Group verbal and written instruction and models provide basic cardiac anatomy and physiology, with the coronary electrical and arterial systems. Review of Valvular disease and Heart Failure   Cardiac Rehab from 02/01/2017 in [facility] Cardiac and Pulmonary Rehab  Date  01/25/17  Educator  [facility]  Instruction Review Code  1- Verbalizes Understanding      Cardiac Procedures: - Group verbal and written instruction to review commonly prescribed medications for heart disease. Reviews the medication, class of the drug, and side effects. Includes the steps to properly store meds and maintain the prescription regimen. (beta blockers and  nitrates)   Cardiac Medications I: - Group verbal and written instruction to review commonly prescribed medications for heart disease. Reviews the medication, class of the drug, and side effects. Includes the steps to properly store meds and maintain the prescription regimen.   Cardiac Rehab from 02/01/2017 in [facility] Cardiac and Pulmonary Rehab  Date  01/18/17  Educator  [place]  Instruction Review Code  1- Verbalizes Understanding      Cardiac Medications II: -Group verbal and written instruction to review commonly prescribed medications for heart disease. Reviews the medication, class of the drug, and side effects. (all other drug classes)    Go Sex-Intimacy & Heart Disease, Get SMART - Goal Setting: - Group verbal and written instruction through game format to discuss heart disease and the return to sexual intimacy. Provides group verbal and written material to discuss and apply goal setting through the application of the S.M.A.R.T. Method.   Other Matters of the Heart: - Provides group verbal, written materials and models to describe Stable Angina and Peripheral Artery. Includes description of the disease process and treatment options available to the cardiac patient.   Exercise & Equipment Safety: - Individual verbal instruction and demonstration of equipment use and safety with use of the equipment.   Cardiac Rehab from 02/01/2017 in [facility] Cardiac and Pulmonary Rehab  Date  11/30/16  Educator  [place]  Instruction Review Code  1- Verbalizes Understanding      Infection Prevention: - Provides verbal and written material to individual with discussion of infection control including proper hand washing and proper equipment cleaning during exercise session.   Cardiac Rehab from 02/01/2017 in [facility] Cardiac and Pulmonary Rehab  Date  11/30/16  Educator  [place]  Instruction Review Code  1- Verbalizes Understanding      Falls Prevention: - Provides verbal and written material to individual with  discussion of falls prevention and safety.   Cardiac Rehab from 02/01/2017 in [facility] Cardiac and Pulmonary Rehab  Date  11/30/16  Educator  [place]  Instruction Review Code  1- Verbalizes Understanding      Diabetes: - Individual verbal and written instruction to review signs/symptoms of diabetes, desired ranges of glucose level fasting, after meals and with exercise. Acknowledge that pre and post exercise glucose checks will be done for 3 sessions at entry of program.   Know Your Numbers and Risk Factors: -Group verbal and written instruction about important numbers in your health.  Discussion of what are risk factors and how they play a role in the disease process.  Review of Cholesterol, Blood Pressure, Diabetes, and BMI and the role they play in your overall health.   Sleep Hygiene: -Provides group verbal and written instruction about how  sleep can affect your health.  Define sleep hygiene, discuss sleep cycles and impact of sleep habits. Review good sleep hygiene tips.    Other: -Provides group and verbal instruction on various topics (see comments)   Knowledge Questionnaire Score: Knowledge Questionnaire Score - 11/30/16 1415      Knowledge Questionnaire Score   Pre Score  22/28 Reviewed correct answers with patient who verbalized understanding.        Core Components/Risk Factors/Patient Goals at Admission: Personal Goals and Risk Factors at Admission - 11/30/16 1406      Core Components/Risk Factors/Patient Goals on Admission    Weight Management  Yes;Weight Loss    Intervention  Weight Management: Develop a combined nutrition and exercise program designed to reach desired caloric intake, while maintaining appropriate intake of nutrient and fiber, sodium and fats, and appropriate energy expenditure required for the weight goal.;Weight Management: Provide education and appropriate resources to help participant work on and attain dietary goals. patient states he has lost weight  recently without trying due to no appetite and not eating much.     Admit Weight  221 lb (100.2 kg)    Goal Weight: Short Term  215 lb (97.5 kg)    Goal Weight: Long Term  211 lb (95.7 kg)    Expected Outcomes  Long Term: Adherence to nutrition and physical activity/exercise program aimed toward attainment of established weight goal;Understanding recommendations for meals to include 15-35% energy as protein, 25-35% energy from fat, 35-60% energy from carbohydrates, less than 271m of dietary cholesterol, 20-35 gm of total fiber daily;Understanding of distribution of calorie intake throughout the day with the consumption of 4-5 meals/snacks;Short Term: Continue to assess and modify interventions until short term weight is achieved;Weight Loss: Understanding of general recommendations for a balanced deficit meal plan, which promotes 1-2 lb weight loss per week and includes a negative energy balance of [id number] kcal/d    Improve shortness of breath with ADL's  Yes    Intervention  Provide education, individualized exercise plan and daily activity instruction to help decrease symptoms of SOB with activities of daily living.    Expected Outcomes  Short Term: Achieves a reduction of symptoms when performing activities of daily living.    Develop more efficient breathing techniques such as purse lipped breathing and diaphragmatic breathing; and practicing self-pacing with activity  Yes    Intervention  Provide education, demonstration and support about specific breathing techniuqes utilized for more efficient breathing. Include techniques such as pursed lipped breathing, diaphragmatic breathing and self-pacing activity.    Expected Outcomes  Short Term: Participant will be able to demonstrate and use breathing techniques as needed throughout daily activities.    Heart Failure  Yes    Intervention  Provide a combined exercise and nutrition program that is supplemented with education, support and counseling about  heart failure. Directed toward relieving symptoms such as shortness of breath, decreased exercise tolerance, and extremity edema.    Expected Outcomes  Improve functional capacity of life;Short term: Attendance in program 2-3 days a week with increased exercise capacity. Reported lower sodium intake. Reported increased fruit and vegetable intake. Reports medication compliance.;Short term: Daily weights obtained and reported for increase. Utilizing diuretic protocols set by physician.;Long term: Adoption of self-care skills and reduction of barriers for early signs and symptoms recognition and intervention leading to self-care maintenance.    Hypertension  Yes    Intervention  Provide education on lifestyle modifcations including regular physical activity/exercise, weight management, moderate sodium restriction and  increased consumption of fresh fruit, vegetables, and low fat dairy, alcohol moderation, and smoking cessation.;Monitor prescription use compliance.    Expected Outcomes  Short Term: Continued assessment and intervention until BP is < 140/60m HG in hypertensive participants. < 130/812mHG in hypertensive participants with diabetes, heart failure or chronic kidney disease.;Long Term: Maintenance of blood pressure at goal levels.    Lipids  Yes    Intervention  Provide education and support for participant on nutrition & aerobic/resistive exercise along with prescribed medications to achieve LDL <7053mHDL >75m51m  Expected Outcomes  Short Term: Participant states understanding of desired cholesterol values and is compliant with medications prescribed. Participant is following exercise prescription and nutrition guidelines.;Long Term: Cholesterol controlled with medications as prescribed, with individualized exercise RX and with personalized nutrition plan. Value goals: LDL < 70mg76mL > 40 mg.    Stress  Yes    Intervention  Offer individual and/or small group education and counseling on  adjustment to heart disease, stress management and health-related lifestyle change. Teach and support self-help strategies.;Refer participants experiencing significant psychosocial distress to appropriate mental health specialists for further evaluation and treatment. When possible, include family members and significant others in education/counseling sessions.    Expected Outcomes  Short Term: Participant demonstrates changes in health-related behavior, relaxation and other stress management skills, ability to obtain effective social support, and compliance with psychotropic medications if prescribed.;Long Term: Emotional wellbeing is indicated by absence of clinically significant psychosocial distress or social isolation.       Core Components/Risk Factors/Patient Goals Review:  Goals and Risk Factor Review    Row Name 12/16/16 0800 01/22/17 0805           Core Components/Risk Factors/Patient Goals Review   Personal Goals Review  Weight Management/Obesity;Heart Failure;Hypertension;Lipids;Improve shortness of breath with ADL's  Weight Management/Obesity;Heart Failure;Hypertension;Lipids;Improve shortness of breath with ADL's      Review  Rae iDwyane Luoff to a good start.  His weight was up 1/2 lbs today as he was eating during the snow days.  His breathing is already starting to get better.  He has not had symptoms of his heart failure.  He weighs daily and watches his sodium intake.  His  blood pressures have been good and he checks them at home on occasion.  He has not had any problems with his medications.   Rae cDwyane Luoinues to do well in rehab.  He is down to 219 Lbs today.  His appetite has not been good and he feels that he is not eating enough.  We will have him talk with nutrition again.  His breathing is getting better and he is able to do more around the house.  He has continues to do better with his heart failure, no swelling, just the shortness of breath.  He continues to watch his sodium intake  and weighing daily.  He continues to do well with his blood pressures and check them at home.       Expected Outcomes  Short: Continue to work on weight loss and check blood pressures a little more frequently.  Long: Continue to work risk factor modifications.   Short: Continue to work on weight loss and improving his SOB.  Long: Continue to work on risk factors.          Core Components/Risk Factors/Patient Goals at Discharge (Final Review):  Goals and Risk Factor Review - 01/22/17 0805      Core Components/Risk Factors/Patient Goals Review   Personal  Goals Review  Weight Management/Obesity;Heart Failure;Hypertension;Lipids;Improve shortness of breath with ADL's    Review  [name] continues to do well in rehab.  He is down to 219 Lbs today.  His appetite has not been good and he feels that he is not eating enough.  We will have him talk with nutrition again.  His breathing is getting better and he is able to do more around the house.  He has continues to do better with his heart failure, no swelling, just the shortness of breath.  He continues to watch his sodium intake and weighing daily.  He continues to do well with his blood pressures and check them at home.     Expected Outcomes  Short: Continue to work on weight loss and improving his SOB.  Long: Continue to work on risk factors.        ITP Comments: ITP Comments    Row Name 11/30/16 1358 12/09/16 0545 12/16/16 1414 01/06/17 0632 01/18/17 0917   ITP Comments  Medical Review Completed; initial ITP created. Diagnosis Documentation can be found in [place] encounter dated 11/07/2016.  30 day review. Continue with ITP unless directed changes per Medical Director review.   Note sent with EKG strips to Dr Kandis Cocking regarding Rea's heartrate being eleveated above 100 during his visits. Getting as high as 160 during exercise exertion.   30 day review. Continue with ITP unless directed changes per Medical Director review.   No changes per MD  after office visit  [name] had several PVC runs, which is consistant with his rhythm history, but today there seemed to be more than usual. He stated that he felt fine and took all his meds. RN will send today's rhythm strips to his cardiologist.    Row Name 01/25/17 0934 01/26/17 1428 02/01/17 0855 02/03/17 0558     ITP Comments  [name] mentioned that he was not feeling well today.  He couldn't really pinpoint anything in particular, just not feeling well.  He sat through class without at problem.  [name] was able to exercise today without a problem, but he just stayed on the NuStep today for the whole time.   [facility] in [facility] discharge planner called that [place] was NOT going to have home health but that he was being sent home on home supplemental oxgyen for recent history of pneumonia. . I suggested that she ask the MD to have [facility] complete Cardiac Rehab since it is basically the same staff in the same gym.   [name] was cleared to return to rehab after his hospitalization.  Documentation found in discharge summary from [facility] visit from ED 01/25/17 in Care Everywhere.  He is now on oxygen therapy for rest and exercise.  Oxygen saturations stayed above 90% during exercise today.   30 Day review. Continue with ITP unless directed changes per Medical Director review.        Comments:

## 2017-02-03 NOTE — Progress Notes (Signed)
Daily Session Note  Patient Details  Name: Jacob Avila MRN: [id number] Date of Birth: [birth year] Referring Provider:     Cardiac Rehab from 11/30/2016 in [facility]iac [facility]  Referring Provider  Mora Appl MD      Encounter Date: 02/03/2017  Check In: Session Check In - 02/03/17 0726      Check-In   Location  [facility]-Cardiac & Pulmonary Rehab    Staff Present  Alberteen Sam, MA, RCEP, CCRP, Exercise Physiologist;Susanne Bice, RN, BSN, CCRP;Amari Burnsworth Flavia Shipper    Supervising physician immediately available to respond to emergencies  See telemetry face sheet for immediately available ER MD    Medication changes reported      No    Fall or balance concerns reported     No    Warm-up and Cool-down  Performed on first and last piece of equipment    Resistance Training Performed  Yes    VAD Patient?  No      Pain Assessment   Currently in Pain?  No/denies          Social History   Tobacco Use  Smoking Status Former Smoker  . Types: Cigarettes  . Last attempt to quit: 01/05/1978  . Years since quitting: 39.1  Smokeless Tobacco Never Used    Goals Met:  Independence with exercise equipment Exercise tolerated well No report of cardiac concerns or symptoms Strength training completed today  Goals Unmet:  Not Applicable  Comments: Pt able to follow exercise prescription today without complaint.  Will continue to monitor for progression. [name] Name 11/30/16 1452 02/03/17 0905       6 Minute Walk   Phase  Initial  Discharge    Distance  1265 feet  1160 feet    Distance % Change  -  -8.3 %    Distance Feet Change  -  -105 ft    Walk Time  6 minutes  6 minutes    # of Rest Breaks  0  0    MPH  2.4  2.2    METS  2.74  2.42    RPE  13  15    Perceived Dyspnea   1  1    VO2 Peak  9.6  8.46    Symptoms  Yes (comment)  Yes (comment)    Comments  SOB  SOB    Resting HR  109 bpm  94 bpm    Resting BP  116/64  106/60    Resting Oxygen Saturation   99 %  95 %    Exercise Oxygen Saturation  during 6 min walk  97 %  92 %    Max Ex. HR  152 bpm  150 bpm    Max Ex. BP  160/74  146/70    2 Minute Post BP  124/64  -      Interval HR   1 Minute HR  -  126    2 Minute HR  -  137    3 Minute HR  -  130    4 Minute HR  -  148    5 Minute HR  -  143    6 Minute HR  -  146    Interval Heart Rate?  -  Yes      Interval Oxygen   Interval Oxygen?  -  Yes    Baseline Oxygen Saturation %  -  95 %  1 Minute Oxygen Saturation %  -  96 %    1 Minute Liters of Oxygen  -  2 L    2 Minute Oxygen Saturation %  -  96 %    2 Minute Liters of Oxygen  -  2 L    3 Minute Oxygen Saturation %  -  93 %    3 Minute Liters of Oxygen  -  2 L    4 Minute Oxygen Saturation %  -  97 %    4 Minute Liters of Oxygen  -  2 L    5 Minute Oxygen Saturation %  -  92 %    5 Minute Liters of Oxygen  -  2 L    6 Minute Oxygen Saturation %  -  94 %    6 Minute Liters of Oxygen  -  2 L      Now wearing O2 for exercise. We have talked about retrying it again.   Dr. Emily Filbert is Medical Director for [place] and [facility].

## 2017-02-05 ENCOUNTER — Encounter: Payer: Medicare Other | Attending: Cardiovascular Disease | Admitting: *Deleted

## 2017-02-05 DIAGNOSIS — I440 Atrioventricular block, first degree: Secondary | ICD-10-CM | POA: Insufficient documentation

## 2017-02-05 DIAGNOSIS — I10 Essential (primary) hypertension: Secondary | ICD-10-CM | POA: Insufficient documentation

## 2017-02-05 DIAGNOSIS — I2510 Atherosclerotic heart disease of native coronary artery without angina pectoris: Secondary | ICD-10-CM | POA: Insufficient documentation

## 2017-02-05 DIAGNOSIS — E785 Hyperlipidemia, unspecified: Secondary | ICD-10-CM | POA: Diagnosis not present

## 2017-02-05 DIAGNOSIS — Z79899 Other long term (current) drug therapy: Secondary | ICD-10-CM | POA: Insufficient documentation

## 2017-02-05 DIAGNOSIS — Z87891 Personal history of nicotine dependence: Secondary | ICD-10-CM | POA: Diagnosis not present

## 2017-02-05 DIAGNOSIS — Z955 Presence of coronary angioplasty implant and graft: Secondary | ICD-10-CM | POA: Diagnosis present

## 2017-02-05 DIAGNOSIS — Z7982 Long term (current) use of aspirin: Secondary | ICD-10-CM | POA: Diagnosis not present

## 2017-02-05 DIAGNOSIS — Z7902 Long term (current) use of antithrombotics/antiplatelets: Secondary | ICD-10-CM | POA: Insufficient documentation

## 2017-02-05 DIAGNOSIS — I4891 Unspecified atrial fibrillation: Secondary | ICD-10-CM | POA: Diagnosis not present

## 2017-02-05 NOTE — Progress Notes (Signed)
Daily Session Note  Patient Details  Name: Jacob Avila MRN: [id number] Date of Birth: August 29, 1927 Referring Provider:     Cardiac Rehab from 11/30/2016 in [facility] Cardiac and Pulmonary Rehab  Referring Provider  Mora Appl MD      Encounter Date: 02/05/2017  Check In: Session Check In - 02/05/17 0923      Check-In   Location  [facility]-Cardiac & Pulmonary Rehab    Staff Present  Alberteen Sam, MA, RCEP, CCRP, Exercise Physiologist;Meredith Sherryll Burger, RN Vickki Hearing, BA, ACSM CEP, Exercise Physiologist    Supervising physician immediately available to respond to emergencies  See telemetry face sheet for immediately available ER MD    Medication changes reported      No    Fall or balance concerns reported     No    Warm-up and Cool-down  Performed on first and last piece of equipment    Resistance Training Performed  Yes    VAD Patient?  No      Pain Assessment   Currently in Pain?  No/denies          Social History   Tobacco Use  Smoking Status Former Smoker  . Types: Cigarettes  . Last attempt to quit: 01/05/1978  . Years since quitting: 39.1  Smokeless Tobacco Never Used    Goals Met:  Independence with exercise equipment Exercise tolerated well No report of cardiac concerns or symptoms Strength training completed today  Goals Unmet:  Not Applicable  Comments: Pt able to follow exercise prescription today without complaint.  Will continue to monitor for progression. [place] Name 11/30/16 1452 02/03/17 0905 02/05/17 0923     6 Minute Walk   Phase  Initial  Discharge  Discharge   Distance  1265 feet  1160 feet now wearing O2 continuously  1530 feet   Distance % Change  -  -8.3 %  21 %   Distance Feet Change  -  -105 ft  265 ft   Walk Time  6 minutes  6 minutes  6 minutes   # of Rest Breaks  0  0  0   MPH  2.4  2.2  2.89   METS  2.74  2.42  3.43   RPE  _0 Perceived Dyspnea   1  1  -   VO2 Peak  9.6  8.46  12   Symptoms   Yes (comment)  Yes (comment)  -   Comments  SOB  SOB  -   Resting HR  109 bpm  94 bpm  -   Resting BP  116/64  106/60  -   Resting Oxygen Saturation   99 %  95 %  -   Exercise Oxygen Saturation  during 6 min walk  97 %  92 %  -   Max Ex. HR  152 bpm  150 bpm  172 bpm   Max Ex. BP  160/74  146/70  152/84   2 Minute Post BP  124/64  -  -     Interval HR   1 Minute HR  -  126  -   2 Minute HR  -  137  -   3 Minute HR  -  130  -   4 Minute HR  -  148  -   5 Minute HR  -  143  -   6 Minute HR  -  146  -  Interval Heart Rate?  -  Yes  -     Interval Oxygen   Interval Oxygen?  -  Yes  -   Baseline Oxygen Saturation %  -  95 %  -   1 Minute Oxygen Saturation %  -  96 %  -   1 Minute Liters of Oxygen  -  2 L  -   2 Minute Oxygen Saturation %  -  96 %  -   2 Minute Liters of Oxygen  -  2 L  -   3 Minute Oxygen Saturation %  -  93 %  -   3 Minute Liters of Oxygen  -  2 L  -   4 Minute Oxygen Saturation %  -  97 %  -   4 Minute Liters of Oxygen  -  2 L  -   5 Minute Oxygen Saturation %  -  92 %  -   5 Minute Liters of Oxygen  -  2 L  -   6 Minute Oxygen Saturation %  -  94 %  -   6 Minute Liters of Oxygen  -  2 L  -        Dr. Emily Filbert is Medical Director for [place] and [facility].

## 2017-02-08 ENCOUNTER — Encounter: Payer: Medicare Other | Admitting: *Deleted

## 2017-02-08 DIAGNOSIS — Z955 Presence of coronary angioplasty implant and graft: Secondary | ICD-10-CM | POA: Diagnosis not present

## 2017-02-08 NOTE — Patient Instructions (Signed)
Discharge Patient Instructions  Patient Details  Name: Jacob Avila MRN: [id number] Date of Birth: [birth year] Referring Provider:  Sandria Bales, MD   Number of Visits: 36/36  Reason for Discharge:  Patient reached a stable level of exercise. Patient independent in their exercise. Patient has met program and personal goals.  Smoking History:  Social History   Tobacco Use  Smoking Status Former Smoker  . Types: Cigarettes  . Last attempt to quit: 01/05/1978  . Years since quitting: 39.1  Smokeless Tobacco Never Used    Diagnosis:  Status post coronary artery stent placement  Initial Exercise Prescription: Initial Exercise Prescription - 11/30/16 1400      Date of Initial Exercise RX and Referring Provider   Date  11/30/16    Referring Provider  Mora Appl MD      Treadmill   MPH  2.3    Grade  0    Minutes  15    METs  2.76      Recumbant Bike   Level  1    RPM  50    Minutes  15    METs  2.5      NuStep   Level  2    SPM  80    Minutes  15    METs  2.5      Prescription Details   Frequency (times per week)  3    Duration  Progress to 45 minutes of aerobic exercise without signs/symptoms of physical distress      Intensity   THRR 40-80% of Max Heartrate  119-129    Ratings of Perceived Exertion  11-13    Perceived Dyspnea  0-4      Progression   Progression  Continue to progress workloads to maintain intensity without signs/symptoms of physical distress.      Resistance Training   Training Prescription  Yes    Weight  3 lbs    Reps  10-15       Discharge Exercise Prescription (Final Exercise Prescription Changes): Exercise Prescription Changes - 01/25/17 1500      Response to Exercise   Blood Pressure (Admit)  114/60    Blood Pressure (Exercise)  112/60    Blood Pressure (Exit)  118/60    Heart Rate (Admit)  124 bpm    Heart Rate (Exercise)  134 bpm    Heart Rate (Exit)  112 bpm    Rating of Perceived Exertion (Exercise)   13    Symptoms  none    Duration  Continue with 45 min of aerobic exercise without signs/symptoms of physical distress.    Intensity  THRR unchanged      Progression   Progression  Continue to progress workloads to maintain intensity without signs/symptoms of physical distress.    Average METs  2.63      Resistance Training   Training Prescription  Yes    Weight  4 lbs    Reps  10-15      Interval Training   Interval Training  No      Recumbant Bike   Level  4    Watts  19    Minutes  15    METs  2.79      NuStep   Level  6    Minutes  15    METs  2.9      Track   Laps  30    Minutes  15    METs  2.38  Home Exercise Plan   Plans to continue exercise at  Home (comment) walking    Frequency  Add 2 additional days to program exercise sessions.    Initial Home Exercises Provided  12/16/16       Functional Capacity: 6 Minute Walk    Row Name 11/30/16 1452 02/03/17 0905 02/05/17 0923     6 Minute Walk   Phase  Initial  Discharge  Discharge   Distance  1265 feet  1160 feet now wearing O2 continuously  1530 feet   Distance % Change  -  -8.3 %  21 %   Distance Feet Change  -  -105 ft  265 ft   Walk Time  6 minutes  6 minutes  6 minutes   # of Rest Breaks  0  0  0   MPH  2.4  2.2  2.89   METS  2.74  2.42  3.43   RPE  _0 Perceived Dyspnea   1  1  -   VO2 Peak  9.6  8.46  12   Symptoms  Yes (comment)  Yes (comment)  -   Comments  SOB  SOB  -   Resting HR  109 bpm  94 bpm  -   Resting BP  116/64  106/60  -   Resting Oxygen Saturation   99 %  95 %  -   Exercise Oxygen Saturation  during 6 min walk  97 %  92 %  -   Max Ex. HR  152 bpm  150 bpm  172 bpm   Max Ex. BP  160/74  146/70  152/84   2 Minute Post BP  124/64  -  -     Interval HR   1 Minute HR  -  126  -   2 Minute HR  -  137  -   3 Minute HR  -  130  -   4 Minute HR  -  148  -   5 Minute HR  -  143  -   6 Minute HR  -  146  -   Interval Heart Rate?  -  Yes  -     Interval Oxygen    Interval Oxygen?  -  Yes  -   Baseline Oxygen Saturation %  -  95 %  -   1 Minute Oxygen Saturation %  -  96 %  -   1 Minute Liters of Oxygen  -  2 L  -   2 Minute Oxygen Saturation %  -  96 %  -   2 Minute Liters of Oxygen  -  2 L  -   3 Minute Oxygen Saturation %  -  93 %  -   3 Minute Liters of Oxygen  -  2 L  -   4 Minute Oxygen Saturation %  -  97 %  -   4 Minute Liters of Oxygen  -  2 L  -   5 Minute Oxygen Saturation %  -  92 %  -   5 Minute Liters of Oxygen  -  2 L  -   6 Minute Oxygen Saturation %  -  94 %  -   6 Minute Liters of Oxygen  -  2 L  -      Quality of Life: Quality of Life - 11/30/16 1411  Quality of Life Scores   Health/Function Pre  27.17 %    Socioeconomic Pre  30 %    Psych/Spiritual Pre  30 %    Family Pre  30 %    GLOBAL Pre  28.78 %       Personal Goals: Goals established at orientation with interventions provided to work toward goal. Personal Goals and Risk Factors at Admission - 11/30/16 1406      Core Components/Risk Factors/Patient Goals on Admission    Weight Management  Yes;Weight Loss    Intervention  Weight Management: Develop a combined nutrition and exercise program designed to reach desired caloric intake, while maintaining appropriate intake of nutrient and fiber, sodium and fats, and appropriate energy expenditure required for the weight goal.;Weight Management: Provide education and appropriate resources to help participant work on and attain dietary goals. patient states he has lost weight recently without trying due to no appetite and not eating much.     Admit Weight  221 lb (100.2 kg)    Goal Weight: Short Term  215 lb (97.5 kg)    Goal Weight: Long Term  211 lb (95.7 kg)    Expected Outcomes  Long Term: Adherence to nutrition and physical activity/exercise program aimed toward attainment of established weight goal;Understanding recommendations for meals to include 15-35% energy as protein, 25-35% energy from fat, 35-60% energy  from carbohydrates, less than 245m of dietary cholesterol, 20-35 gm of total fiber daily;Understanding of distribution of calorie intake throughout the day with the consumption of 4-5 meals/snacks;Short Term: Continue to assess and modify interventions until short term weight is achieved;Weight Loss: Understanding of general recommendations for a balanced deficit meal plan, which promotes 1-2 lb weight loss per week and includes a negative energy balance of [phone] kcal/d    Improve shortness of breath with ADL's  Yes    Intervention  Provide education, individualized exercise plan and daily activity instruction to help decrease symptoms of SOB with activities of daily living.    Expected Outcomes  Short Term: Achieves a reduction of symptoms when performing activities of daily living.    Develop more efficient breathing techniques such as purse lipped breathing and diaphragmatic breathing; and practicing self-pacing with activity  Yes    Intervention  Provide education, demonstration and support about specific breathing techniuqes utilized for more efficient breathing. Include techniques such as pursed lipped breathing, diaphragmatic breathing and self-pacing activity.    Expected Outcomes  Short Term: Participant will be able to demonstrate and use breathing techniques as needed throughout daily activities.    Heart Failure  Yes    Intervention  Provide a combined exercise and nutrition program that is supplemented with education, support and counseling about heart failure. Directed toward relieving symptoms such as shortness of breath, decreased exercise tolerance, and extremity edema.    Expected Outcomes  Improve functional capacity of life;Short term: Attendance in program 2-3 days a week with increased exercise capacity. Reported lower sodium intake. Reported increased fruit and vegetable intake. Reports medication compliance.;Short term: Daily weights obtained and reported for increase. Utilizing  diuretic protocols set by physician.;Long term: Adoption of self-care skills and reduction of barriers for early signs and symptoms recognition and intervention leading to self-care maintenance.    Hypertension  Yes    Intervention  Provide education on lifestyle modifcations including regular physical activity/exercise, weight management, moderate sodium restriction and increased consumption of fresh fruit, vegetables, and low fat dairy, alcohol moderation, and smoking cessation.;Monitor prescription use compliance.  Expected Outcomes  Short Term: Continued assessment and intervention until BP is < 140/81m HG in hypertensive participants. < 130/83mHG in hypertensive participants with diabetes, heart failure or chronic kidney disease.;Long Term: Maintenance of blood pressure at goal levels.    Lipids  Yes    Intervention  Provide education and support for participant on nutrition & aerobic/resistive exercise along with prescribed medications to achieve LDL <7088mHDL >59m31m  Expected Outcomes  Short Term: Participant states understanding of desired cholesterol values and is compliant with medications prescribed. Participant is following exercise prescription and nutrition guidelines.;Long Term: Cholesterol controlled with medications as prescribed, with individualized exercise RX and with personalized nutrition plan. Value goals: LDL < 70mg60mL > 40 mg.    Stress  Yes    Intervention  Offer individual and/or small group education and counseling on adjustment to heart disease, stress management and health-related lifestyle change. Teach and support self-help strategies.;Refer participants experiencing significant psychosocial distress to appropriate mental health specialists for further evaluation and treatment. When possible, include family members and significant others in education/counseling sessions.    Expected Outcomes  Short Term: Participant demonstrates changes in health-related behavior,  relaxation and other stress management skills, ability to obtain effective social support, and compliance with psychotropic medications if prescribed.;Long Term: Emotional wellbeing is indicated by absence of clinically significant psychosocial distress or social isolation.        Personal Goals Discharge: Goals and Risk Factor Review - 01/22/17 0805      Core Components/Risk Factors/Patient Goals Review   Personal Goals Review  Weight Management/Obesity;Heart Failure;Hypertension;Lipids;Improve shortness of breath with ADL's    Review  Rae cDwyane Luoinues to do well in rehab.  He is down to 219 Lbs today.  His appetite has not been good and he feels that he is not eating enough.  We will have him talk with nutrition again.  His breathing is getting better and he is able to do more around the house.  He has continues to do better with his heart failure, no swelling, just the shortness of breath.  He continues to watch his sodium intake and weighing daily.  He continues to do well with his blood pressures and check them at home.     Expected Outcomes  Short: Continue to work on weight loss and improving his SOB.  Long: Continue to work on risk factors.        Exercise Goals and Review: Exercise Goals    Row Name 11/30/16 1503             Exercise Goals   Increase Physical Activity  Yes       Intervention  Provide advice, education, support and counseling about physical activity/exercise needs.;Develop an individualized exercise prescription for aerobic and resistive training based on initial evaluation findings, risk stratification, comorbidities and participant's personal goals.       Expected Outcomes  Achievement of increased cardiorespiratory fitness and enhanced flexibility, muscular endurance and strength shown through measurements of functional capacity and personal statement of participant.       Increase Strength and Stamina  Yes       Intervention  Provide advice, education, support and  counseling about physical activity/exercise needs.;Develop an individualized exercise prescription for aerobic and resistive training based on initial evaluation findings, risk stratification, comorbidities and participant's personal goals.       Expected Outcomes  Achievement of increased cardiorespiratory fitness and enhanced flexibility, muscular endurance and strength shown through measurements of functional capacity  and personal statement of participant.       Able to understand and use rate of perceived exertion (RPE) scale  Yes       Intervention  Provide education and explanation on how to use RPE scale       Expected Outcomes  Short Term: Able to use RPE daily in rehab to express subjective intensity level;Long Term:  Able to use RPE to guide intensity level when exercising independently       Knowledge and understanding of Target Heart Rate Range (THRR)  Yes       Intervention  Provide education and explanation of THRR including how the numbers were predicted and where they are located for reference       Expected Outcomes  Short Term: Able to state/look up THRR;Long Term: Able to use THRR to govern intensity when exercising independently;Short Term: Able to use daily as guideline for intensity in rehab       Able to check pulse independently  Yes       Intervention  Provide education and demonstration on how to check pulse in carotid and radial arteries.;Review the importance of being able to check your own pulse for safety during independent exercise       Expected Outcomes  Short Term: Able to explain why pulse checking is important during independent exercise;Long Term: Able to check pulse independently and accurately       Understanding of Exercise Prescription  Yes       Intervention  Provide education, explanation, and written materials on patient's individual exercise prescription       Expected Outcomes  Short Term: Able to explain program exercise prescription;Long Term: Able to  explain home exercise prescription to exercise independently          Nutrition & Weight - Outcomes: Pre Biometrics - 11/30/16 1504      Pre Biometrics   Height  6' 0.5" (1.842 m)    Weight  221 lb (100.2 kg)    Waist Circumference  39 inches    Hip Circumference  43 inches    Waist to Hip Ratio  0.91 %    BMI (Calculated)  29.54    Single Leg Stand  1.65 seconds      Post Biometrics - 02/03/17 9604       Post  Biometrics   Height  6' 0.5" (1.842 m)    Weight  211 lb (95.7 kg)    Waist Circumference  38 inches    Hip Circumference  43 inches    Waist to Hip Ratio  0.88 %    BMI (Calculated)  28.21    Single Leg Stand  1.66 seconds       Nutrition: Nutrition Therapy & Goals - 02/03/17 0922      Nutrition Therapy   Diet  TLC    Drug/Food Interactions  Statins/Certain Fruits    Protein (specify units)  9oz    Fiber  36 grams    Saturated Fats  16 max. grams    Fruits and Vegetables  6 servings/day    Sodium  2000 grams      Personal Nutrition Goals   Nutrition Goal  Choose foods with strong flavors "twang" and aromas to help stimulate appetite    Personal Goal #2  Continue to drink Ensure supplement drinks on a regular basis until appetite improves    Personal Goal #3  Eat a source of protein at each meal and snack  Intervention Plan   Intervention  Prescribe, educate and counsel regarding individualized specific dietary modifications aiming towards targeted core components such as weight, hypertension, lipid management, diabetes, heart failure and other comorbidities.    Expected Outcomes  Short Term Goal: Understand basic principles of dietary content, such as calories, fat, sodium, cholesterol and nutrients.;Short Term Goal: A plan has been developed with personal nutrition goals set during dietitian appointment.;Long Term Goal: Adherence to prescribed nutrition plan.       Nutrition Discharge: Nutrition Assessments - 11/30/16 1409      MEDFICTS Scores    Pre Score  6 Patient states he is not eating much b/c he has no appetite.       Education Questionnaire Score: Knowledge Questionnaire Score - 11/30/16 1415      Knowledge Questionnaire Score   Pre Score  22/28 Reviewed correct answers with patient who verbalized understanding.        Goals reviewed with patient; copy given to patient.

## 2017-02-08 NOTE — Progress Notes (Signed)
Daily Session Note  Patient Details  Name: JEX STRAUSBAUGH MRN: [id number] Date of Birth: 05-25-1927 Referring Provider:     Cardiac Rehab from 11/30/2016 in [facility]  Referring Provider  Mora Appl MD      Encounter Date: 02/08/2017  Check In: Session Check In - 02/08/17 0807      Check-In   Location  [facility]    Staff Present  Alberteen Sam, MA, RCEP, CCRP, Exercise Physiologist;Kelly Amedeo Plenty, BS, ACSM CEP, Exercise Physiologist;Susanne Bice, RN, BSN, CCRP    Supervising physician immediately available to respond to emergencies  See telemetry face sheet for immediately available ER MD    Fall or balance concerns reported     No    Warm-up and Cool-down  Performed on first and last piece of equipment    Resistance Training Performed  Yes    VAD Patient?  No      Pain Assessment   Currently in Pain?  No/denies    Multiple Pain Sites  No          Social History   Tobacco Use  Smoking Status Former Smoker  . Types: Cigarettes  . Last attempt to quit: 01/05/1978  . Years since quitting: 39.1  Smokeless Tobacco Never Used    Goals Met:  Independence with exercise equipment Exercise tolerated well No report of cardiac concerns or symptoms Strength training completed today  Goals Unmet:  Not Applicable  Comments: Pt able to follow exercise prescription today without complaint.  Will continue to monitor for progression.    Dr. Emily Filbert is Medical Director for [facility].

## 2017-02-15 ENCOUNTER — Encounter: Payer: Medicare Other | Admitting: *Deleted

## 2017-02-15 DIAGNOSIS — Z955 Presence of coronary angioplasty implant and graft: Secondary | ICD-10-CM | POA: Diagnosis not present

## 2017-02-15 NOTE — Progress Notes (Signed)
Cardiac Individual Treatment Plan  Patient Details  Name: Jacob Avila MRN: [id number] Date of Birth: 23-Dec-1927 Referring Provider:     Cardiac Rehab from 11/30/2016 in [facility] Cardiac [facility] Pulmonary Rehab  Referring Provider  Mora Appl MD      Initial Encounter Date:    Cardiac Rehab from 11/30/2016 in [facility] Cardiac and Pulmonary Rehab  Date  11/30/16  Referring Provider  Mora Appl MD      Visit Diagnosis: Status post coronary artery stent placement  Patient's Home Medications on Admission:  Current Outpatient Medications:  .  aspirin EC 81 MG tablet, Take 81 mg by mouth., Disp: , Rfl:  .  atorvastatin (LIPITOR) 80 MG tablet, Take 80 mg by mouth., Disp: , Rfl:  .  clopidogrel (PLAVIX) 75 MG tablet, Take 75 mg by mouth., Disp: , Rfl:  .  furosemide (LASIX) 20 MG tablet, Take 40 mg by mouth., Disp: , Rfl:  .  lisinopril (PRINIVIL,ZESTRIL) 2.5 MG tablet, Take 2.5 mg by mouth., Disp: , Rfl:  .  metoprolol succinate (TOPROL-XL) 50 MG 24 hr tablet, Take 50 mg by mouth., Disp: , Rfl:  .  omeprazole (PRILOSEC) 20 MG capsule, Take 20 mg by mouth., Disp: , Rfl:  .  simvastatin (ZOCOR) 40 MG tablet, Take 40 mg by mouth., Disp: , Rfl:   Past Medical History: Past Medical History:  Diagnosis Date  . 1st degree AV block 05/13/2012  . Arteriosclerosis of coronary artery 11/12/2014  . Atrial fibrillation ([place]) 11/30/2016  . BP (high blood pressure) 05/12/2012  . Bradycardia 05/12/2012  . Dizziness 05/12/2012  . HLD (hyperlipidemia)     Tobacco Use: Social History   Tobacco Use  Smoking Status Former Smoker  . Types: Cigarettes  . Last attempt to quit: 01/05/1978  . Years since quitting: 39.1  Smokeless Tobacco Never Used    Labs: Recent Review Flowsheet Data    There is no flowsheet data to display.       Exercise Target Goals:    Exercise Program Goal: Individual exercise prescription set using results from initial 6 min walk test and THRR while  considering  patient's activity barriers and safety.   Exercise Prescription Goal: Initial exercise prescription builds to 30-45 minutes a day of aerobic activity, 2-3 days per week.  Home exercise guidelines will be given to patient during program as part of exercise prescription that the participant will acknowledge.  Activity Barriers & Risk Stratification: Activity Barriers & Cardiac Risk Stratification - 11/30/16 1413      Activity Barriers & Cardiac Risk Stratification   Activity Barriers  Shortness of Breath;Deconditioning;Muscular Weakness;Balance Concerns    Cardiac Risk Stratification  Moderate       6 Minute Walk: 6 Minute Walk    Row Name 11/30/16 1452 02/03/17 0905 02/05/17 0923     6 Minute Walk   Phase  Initial  Discharge  Discharge   Distance  1265 feet  1160 feet now wearing O2 continuously  1530 feet   Distance % Change  -  -8.3 %  21 %   Distance Feet Change  -  -105 ft  265 ft   Walk Time  6 minutes  6 minutes  6 minutes   # of Rest Breaks  0  0  0   MPH  2.4  2.2  2.89   METS  2.74  2.42  3.43   RPE  _0 Perceived Dyspnea   1  1  -  VO2 Peak  9.6  8.46  12   Symptoms  Yes (comment)  Yes (comment)  -   Comments  SOB  SOB  -   Resting HR  109 bpm  94 bpm  -   Resting BP  116/64  106/60  -   Resting Oxygen Saturation   99 %  95 %  -   Exercise Oxygen Saturation  during 6 min walk  97 %  92 %  -   Max Ex. HR  152 bpm  150 bpm  172 bpm   Max Ex. BP  160/74  146/70  152/84   2 Minute Post BP  124/64  -  -     Interval HR   1 Minute HR  -  126  -   2 Minute HR  -  137  -   3 Minute HR  -  130  -   4 Minute HR  -  148  -   5 Minute HR  -  143  -   6 Minute HR  -  146  -   Interval Heart Rate?  -  Yes  -     Interval Oxygen   Interval Oxygen?  -  Yes  -   Baseline Oxygen Saturation %  -  95 %  -   1 Minute Oxygen Saturation %  -  96 %  -   1 Minute Liters of Oxygen  -  2 L  -   2 Minute Oxygen Saturation %  -  96 %  -   2 Minute Liters of  Oxygen  -  2 L  -   3 Minute Oxygen Saturation %  -  93 %  -   3 Minute Liters of Oxygen  -  2 L  -   4 Minute Oxygen Saturation %  -  97 %  -   4 Minute Liters of Oxygen  -  2 L  -   5 Minute Oxygen Saturation %  -  92 %  -   5 Minute Liters of Oxygen  -  2 L  -   6 Minute Oxygen Saturation %  -  94 %  -   6 Minute Liters of Oxygen  -  2 L  -      Oxygen Initial Assessment: Oxygen Initial Assessment - 02/01/17 0858      Home Oxygen   Home Oxygen Device  Home Concentrator;E-Tanks    Sleep Oxygen Prescription  Continuous    Liters per minute  2    Home Exercise Oxygen Prescription  Continuous    Liters per minute  2    Home at Rest Exercise Oxygen Prescription  Continuous    Liters per minute  2    Compliance with Home Oxygen Use  Yes      Program Oxygen Prescription   Program Oxygen Prescription  Continuous;E-Tanks    Liters per minute  2      Intervention   Short Term Goals  To learn and exhibit compliance with exercise, home and travel O2 prescription;To learn and understand importance of maintaining oxygen saturations>88%;To learn and understand importance of monitoring SPO2 with pulse oximeter and demonstrate accurate use of the pulse oximeter.;To learn and demonstrate proper pursed lip breathing techniques or other breathing techniques.    Long  Term Goals  Maintenance of O2 saturations>88%;Exhibits compliance with exercise, home and travel O2 prescription;Verbalizes importance of monitoring SPO2  with pulse oximeter and return demonstration;Exhibits proper breathing techniques, such as pursed lip breathing or other method taught during program session       Oxygen Re-Evaluation: Oxygen Re-Evaluation    Row Name 02/01/17 0859             Program Oxygen Prescription   Program Oxygen Prescription  Continuous;E-Tanks       Liters per minute  2         Home Oxygen   Home Oxygen Device  Home Concentrator;E-Tanks       Sleep Oxygen Prescription  Continuous        Liters per minute  2       Home Exercise Oxygen Prescription  Continuous       Liters per minute  2       Home at Rest Exercise Oxygen Prescription  Continuous       Liters per minute  2       Compliance with Home Oxygen Use  Yes         Goals/Expected Outcomes   Short Term Goals  To learn and exhibit compliance with exercise, home and travel O2 prescription;To learn and understand importance of maintaining oxygen saturations>88%;To learn and understand importance of monitoring SPO2 with pulse oximeter and demonstrate accurate use of the pulse oximeter.;To learn and demonstrate proper pursed lip breathing techniques or other breathing techniques.       Long  Term Goals  Maintenance of O2 saturations>88%;Exhibits compliance with exercise, home and travel O2 prescription;Verbalizes importance of monitoring SPO2 with pulse oximeter and return demonstration;Exhibits proper breathing techniques, such as pursed lip breathing or other method taught during program session       Comments  Reviewed PLB technique with pt.  Talked about how it work and it's important to maintaining his exercise saturations.         Goals/Expected Outcomes  Short: Become more profiecient at using PLB.   Long: Become independent at using PLB.          Oxygen Discharge (Final Oxygen Re-Evaluation): Oxygen Re-Evaluation - 02/01/17 0859      Program Oxygen Prescription   Program Oxygen Prescription  Continuous;E-Tanks    Liters per minute  2      Home Oxygen   Home Oxygen Device  Home Concentrator;E-Tanks    Sleep Oxygen Prescription  Continuous    Liters per minute  2    Home Exercise Oxygen Prescription  Continuous    Liters per minute  2    Home at Rest Exercise Oxygen Prescription  Continuous    Liters per minute  2    Compliance with Home Oxygen Use  Yes      Goals/Expected Outcomes   Short Term Goals  To learn and exhibit compliance with exercise, home and travel O2 prescription;To learn and understand  importance of maintaining oxygen saturations>88%;To learn and understand importance of monitoring SPO2 with pulse oximeter and demonstrate accurate use of the pulse oximeter.;To learn and demonstrate proper pursed lip breathing techniques or other breathing techniques.    Long  Term Goals  Maintenance of O2 saturations>88%;Exhibits compliance with exercise, home and travel O2 prescription;Verbalizes importance of monitoring SPO2 with pulse oximeter and return demonstration;Exhibits proper breathing techniques, such as pursed lip breathing or other method taught during program session    Comments  Reviewed PLB technique with pt.  Talked about how it work and it's important to maintaining his exercise saturations.      Goals/Expected Outcomes  Short: Become more profiecient at using PLB.   Long: Become independent at using PLB.       Initial Exercise Prescription: Initial Exercise Prescription - 11/30/16 1400      Date of Initial Exercise RX and Referring Provider   Date  11/30/16    Referring Provider  Mora Appl MD      Treadmill   MPH  2.3    Grade  0    Minutes  15    METs  2.76      Recumbant Bike   Level  1    RPM  50    Minutes  15    METs  2.5      NuStep   Level  2    SPM  80    Minutes  15    METs  2.5      Prescription Details   Frequency (times per week)  3    Duration  Progress to 45 minutes of aerobic exercise without signs/symptoms of physical distress      Intensity   THRR 40-80% of Max Heartrate  119-129    Ratings of Perceived Exertion  11-13    Perceived Dyspnea  0-4      Progression   Progression  Continue to progress workloads to maintain intensity without signs/symptoms of physical distress.      Resistance Training   Training Prescription  Yes    Weight  3 lbs    Reps  10-15       Perform Capillary Blood Glucose checks as needed.  Exercise Prescription Changes: Exercise Prescription Changes    Row Name 11/30/16 1400 12/15/16 1300  12/16/16 0800 12/30/16 1500 01/13/17 1500     Response to Exercise   Blood Pressure (Admit)  116/64  128/64  -  126/54  124/82   Blood Pressure (Exercise)  124/64  128/64  -  128/72  116/74   Blood Pressure (Exit)  112/64  124/70  -  126/74  128/70   Heart Rate (Admit)  109 bpm  104 bpm  -  98 bpm  111 bpm   Heart Rate (Exercise)  152 bpm  160 bpm  -  147 bpm high heart rate during resistance training  100 bpm   Heart Rate (Exit)  106 bpm  109 bpm  -  105 bpm  118 bpm   Oxygen Saturation (Admit)  99 %  -  -  -  -   Oxygen Saturation (Exercise)  97 %  -  -  -  -   Rating of Perceived Exertion (Exercise)  13  15  -  11  11   Perceived Dyspnea (Exercise)  1  -  -  -  -   Symptoms  SOB  none  -  none  none   Comments  walk test resutls  -  -  -  -   Duration  -  Continue with 45 min of aerobic exercise without signs/symptoms of physical distress.  -  Continue with 45 min of aerobic exercise without signs/symptoms of physical distress.  Continue with 45 min of aerobic exercise without signs/symptoms of physical distress.   Intensity  -  THRR unchanged  -  THRR unchanged  THRR unchanged     Progression   Progression  -  Continue to progress workloads to maintain intensity without signs/symptoms of physical distress.  -  Continue to progress workloads to maintain intensity without signs/symptoms of physical distress.  Continue to progress workloads to maintain intensity without signs/symptoms of physical distress.   Average METs  -  2.62  -  3.01  3.27     Resistance Training   Training Prescription  -  Yes  -  Yes  Yes   Weight  -  3 lbs  -  4 lbs  4 lbs   Reps  -  10-15  -  10-15  10-15     Interval Training   Interval Training  -  No  -  No  No     Recumbant Bike   Level  -  3  -  4  4   Watts  -  18  -  25  18   Minutes  -  15  -  15  15   METs  -  2.79  -  2.79  2.79     NuStep   Level  -  4  -  5  6   Minutes  -  15  -  15  15   METs  -  2.1  -  3.4  3.9     Track   Laps  -   46  -  40  46   Minutes  -  15  -  15  15   METs  -  3.14  -  2.84  3.14     Home Exercise Plan   Plans to continue exercise at  -  -  Home (comment) walking  Home (comment) walking  Home (comment) walking   Frequency  -  -  Add 2 additional days to program exercise sessions.  Add 2 additional days to program exercise sessions.  Add 2 additional days to program exercise sessions.   Initial Home Exercises Provided  -  -  12/16/16  12/16/16  12/16/16   Row Name 01/25/17 1500 02/10/17 1400           Response to Exercise   Blood Pressure (Admit)  114/60  112/72      Blood Pressure (Exercise)  112/60  122/62      Blood Pressure (Exit)  118/60  132/64      Heart Rate (Admit)  124 bpm  125 bpm      Heart Rate (Exercise)  134 bpm  150 bpm      Heart Rate (Exit)  112 bpm  102 bpm      Rating of Perceived Exertion (Exercise)  13  12      Symptoms  none  none      Duration  Continue with 45 min of aerobic exercise without signs/symptoms of physical distress.  Continue with 45 min of aerobic exercise without signs/symptoms of physical distress.      Intensity  THRR unchanged  THRR unchanged        Progression   Progression  Continue to progress workloads to maintain intensity without signs/symptoms of physical distress.  Continue to progress workloads to maintain intensity without signs/symptoms of physical distress.      Average METs  2.63  2.96        Resistance Training   Training Prescription  Yes  Yes      Weight  4 lbs  4 lbs      Reps  10-15  10-15        Interval Training   Interval Training  No  No        Recumbant Bike  Level  4  4      Watts  19  22      Minutes  15  15      METs  2.79  2.79        NuStep   Level  6  6      Minutes  15  15      METs  2.9  3.7        Track   Laps  30  30      Minutes  15  15      METs  2.38  2.38        Home Exercise Plan   Plans to continue exercise at  Home (comment) walking  Home (comment) walking      Frequency  Add 2  additional days to program exercise sessions.  Add 2 additional days to program exercise sessions.      Initial Home Exercises Provided  12/16/16  12/16/16         Exercise Comments: Exercise Comments    Row Name 12/07/16 0804 01/25/17 0934 02/15/17 0808       Exercise Comments  First full day of exercise!  Patient was oriented to gym and equipment including functions, settings, policies, and procedures.  Patient's individual exercise prescription and treatment plan were reviewed.  All starting workloads were established based on the results of the 6 minute walk test done at initial orientation visit.  The plan for exercise progression was also introduced and progression will be customized based on patient's performance and goals.  Jacob Avila mentioned that he was not feeling well today.  He couldn't really pinpoint anything in particular, just not feeling well.  He sat through class without at problem.  Jacob Avila was able to exercise today without a problem, but he just stayed on the NuStep today for the whole time.   Timarion graduated today from cardiac rehab with 36 sessions completed.  Details of the patient's exercise prescription and what He needs to do in order to continue the prescription and progress were discussed with patient.  Patient was given a copy of prescription and goals.  Patient verbalized understanding.  Saharsh plans to continue to exercise by coming into Pulmonary Rehab.        Exercise Goals and Review: Exercise Goals    Row Name 11/30/16 1503             Exercise Goals   Increase Physical Activity  Yes       Intervention  Provide advice, education, support and counseling about physical activity/exercise needs.;Develop an individualized exercise prescription for aerobic and resistive training based on initial evaluation findings, risk stratification, comorbidities and participant's personal goals.       Expected Outcomes  Achievement of increased cardiorespiratory fitness and  enhanced flexibility, muscular endurance and strength shown through measurements of functional capacity and personal statement of participant.       Increase Strength and Stamina  Yes       Intervention  Provide advice, education, support and counseling about physical activity/exercise needs.;Develop an individualized exercise prescription for aerobic and resistive training based on initial evaluation findings, risk stratification, comorbidities and participant's personal goals.       Expected Outcomes  Achievement of increased cardiorespiratory fitness and enhanced flexibility, muscular endurance and strength shown through measurements of functional capacity and personal statement of participant.       Able to understand and use rate of perceived exertion (RPE) scale  Yes  Intervention  Provide education and explanation on how to use RPE scale       Expected Outcomes  Short Term: Able to use RPE daily in rehab to express subjective intensity level;Long Term:  Able to use RPE to guide intensity level when exercising independently       Knowledge and understanding of Target Heart Rate Range (THRR)  Yes       Intervention  Provide education and explanation of THRR including how the numbers were predicted and where they are located for reference       Expected Outcomes  Short Term: Able to state/look up THRR;Long Term: Able to use THRR to govern intensity when exercising independently;Short Term: Able to use daily as guideline for intensity in rehab       Able to check pulse independently  Yes       Intervention  Provide education and demonstration on how to check pulse in carotid and radial arteries.;Review the importance of being able to check your own pulse for safety during independent exercise       Expected Outcomes  Short Term: Able to explain why pulse checking is important during independent exercise;Long Term: Able to check pulse independently and accurately       Understanding of Exercise  Prescription  Yes       Intervention  Provide education, explanation, and written materials on patient's individual exercise prescription       Expected Outcomes  Short Term: Able to explain program exercise prescription;Long Term: Able to explain home exercise prescription to exercise independently          Exercise Goals Re-Evaluation : Exercise Goals Re-Evaluation    Row Name 12/07/16 0805 12/15/16 1309 12/16/16 0816 12/30/16 1512 01/13/17 1536     Exercise Goal Re-Evaluation   Exercise Goals Review  Knowledge and understanding of Target Heart Rate Range (THRR);Able to understand and use rate of perceived exertion (RPE) scale;Understanding of Exercise Prescription  Increase Physical Activity;Increase Strength and Stamina;Understanding of Exercise Prescription  -  Increase Physical Activity;Increase Strength and Stamina;Understanding of Exercise Prescription  Increase Physical Activity;Increase Strength and Stamina;Understanding of Exercise Prescription   Comments  Reviewed RPE scale, THR and program prescription with pt today.  Pt voiced understanding and was given a copy of goals to take home.   Jacob Avila is off to a good start in rehab.  He is already up to 18 watts on the recumbent bike.  We will continue to monitor his progress.   Reviewed home exercise with pt today.  Pt plans to walk at home for exercise.   He will be walking on his off days from exercise.  Reviewed THR, pulse, RPE, sign and symptoms, NTG use, and when to call 911 or MD.  Also discussed weather considerations and indoor options.  Pt voiced understanding.  Jacob Avila has been doing well in rehab.  He has not been walking at much as we talked about but he goes on occasion.  He is up to 25 watts on the recumbent bike and level 5 on the NuStep.  We will continue to monitor his progression.   Jacob Avila continues to do well in rehab.  He was out today for a [place] appointment.  He continues to not walk at home.  He is up to level 6 now on the NuStep.  We  will continue to encourage him to walk at home and to monitor his progression.    Expected Outcomes  Short: Use RPE daily to regulate  intensity.  Long: Follow program prescription in THR.  Short: Continue to attend regularly and review home exercise.  Long: Continue to follow program prescription.   Short: Add in walking daily as part of routine to build strength and stamina. Long: Make exercise part of routine.   Short: Continue to try to exercise more at home on off days.  Long: Continue to increase physical activity levels.   Short: Continue to ty increase watts on recumbent bike.  Long: Continue to walk more at home.    [place] Name 01/22/17 0800 01/25/17 1542 02/10/17 1443         Exercise Goal Re-Evaluation   Exercise Goals Review  Increase Physical Activity;Increase Strength and Stamina;Understanding of Exercise Prescription  Increase Physical Activity;Increase Strength and Stamina;Understanding of Exercise Prescription  Increase Physical Activity;Increase Strength and Stamina;Understanding of Exercise Prescription     Comments  Jacob Avila has been doing well in rehab.  He has only been doing some of his exercise at home.  He stays active but not actually taking the time to go for his walk.  He said that it would help if it had more incentive to do his walking.  He says he lacks a appetitie, maybe more exercise can help.  He is open to trying it out.   Jacob Avila has continued to do well in rehab.  Today, he did not feel as good so he stayed on the NuStep the whole time.  He has worked upto 19 watts on the recumbent bike.  We will continue to monitor his progression.   Jacob Avila will be graduating on Friday!!  He plans to continue to exercise by coming to Pulmonary Rehab.      Expected Outcomes  Short: Walk more at home to help with appetite.  Long: Continue to work on IT sales professional.  Short: Continue to try to increase number of laps walking back to where he was before.  Long: Continue to exercise more to work on  strength and stamina.   Short: Graduation.  Long: Enroll in pulmonary rehab to continue to exercise.         Discharge Exercise Prescription (Final Exercise Prescription Changes): Exercise Prescription Changes - 02/10/17 1400      Response to Exercise   Blood Pressure (Admit)  112/72    Blood Pressure (Exercise)  122/62    Blood Pressure (Exit)  132/64    Heart Rate (Admit)  125 bpm    Heart Rate (Exercise)  150 bpm    Heart Rate (Exit)  102 bpm    Rating of Perceived Exertion (Exercise)  12    Symptoms  none    Duration  Continue with 45 min of aerobic exercise without signs/symptoms of physical distress.    Intensity  THRR unchanged      Progression   Progression  Continue to progress workloads to maintain intensity without signs/symptoms of physical distress.    Average METs  2.96      Resistance Training   Training Prescription  Yes    Weight  4 lbs    Reps  10-15      Interval Training   Interval Training  No      Recumbant Bike   Level  4    Watts  22    Minutes  15    METs  2.79      NuStep   Level  6    Minutes  15    METs  3.7  Track   Laps  30    Minutes  15    METs  2.38      Home Exercise Plan   Plans to continue exercise at  Home (comment) walking    Frequency  Add 2 additional days to program exercise sessions.    Initial Home Exercises Provided  12/16/16       Nutrition:  Target Goals: Understanding of nutrition guidelines, daily intake of sodium <1553m, cholesterol <2067m calories 30% from fat and 7% or less from saturated fats, daily to have 5 or more servings of fruits and vegetables.  Biometrics: Pre Biometrics - 11/30/16 1504      Pre Biometrics   Height  6' 0.5" (1.842 m)    Weight  221 lb (100.2 kg)    Waist Circumference  39 inches    Hip Circumference  43 inches    Waist to Hip Ratio  0.91 %    BMI (Calculated)  29.54    Single Leg Stand  1.65 seconds      Post Biometrics - 02/03/17 [id number]     Post  Biometrics    Height  6' 0.5" (1.842 m)    Weight  211 lb (95.7 kg)    Waist Circumference  38 inches    Hip Circumference  43 inches    Waist to Hip Ratio  0.88 %    BMI (Calculated)  28.21    Single Leg Stand  1.66 seconds       Nutrition Therapy Plan and Nutrition Goals: Nutrition Therapy & Goals - 02/03/17 0922      Nutrition Therapy   Diet  TLC    Drug/Food Interactions  Statins/Certain Fruits    Protein (specify units)  9oz    Fiber  36 grams    Saturated Fats  16 max. grams    Fruits and Vegetables  6 servings/day    Sodium  2000 grams      Personal Nutrition Goals   Nutrition Goal  Choose foods with strong flavors "twang" and aromas to help stimulate appetite    Personal Goal #2  Continue to drink Ensure supplement drinks on a regular basis until appetite improves    Personal Goal #3  Eat a source of protein at each meal and snack      Intervention Plan   Intervention  Prescribe, educate and counsel regarding individualized specific dietary modifications aiming towards targeted core components such as weight, hypertension, lipid management, diabetes, heart failure and other comorbidities.    Expected Outcomes  Short Term Goal: Understand basic principles of dietary content, such as calories, fat, sodium, cholesterol and nutrients.;Short Term Goal: A plan has been developed with personal nutrition goals set during dietitian appointment.;Long Term Goal: Adherence to prescribed nutrition plan.       Nutrition Assessments: Nutrition Assessments - 02/15/17 1513      MEDFICTS Scores   Pre Score  6    Post Score  30    Score Difference  24       Nutrition Goals Re-Evaluation: Nutrition Goals Re-Evaluation    Row Name 12/16/16 0804 01/22/17 [id number]         Goals   Current Weight  -  219 lb (99.3 kg)      Nutrition Goal  Meet with LiLattie Hawbout nutrtition  Meet with LiLattie Hawo talk about appetitie loss and how to kick it up a notch.  Also would like to continue to lose weight.  Comment  Jacob Avila does not want to set up a separate appointment for nutrition.  He is open to meeting during class for some tips to his diet and hearing her suggestions.   Jacob Avila missed his appointmet with nutrition.  We will reschedule to happen during class on Jan 30.      Expected Outcome  Short: Meet with Lattie Haw.  Long: Apply some tips to his diet and maintain heart healthy diet.   Short: Meet with Lattie Haw.  Long: Apply some tips to his diet and maintain heart healthy diet.          Nutrition Goals Discharge (Final Nutrition Goals Re-Evaluation): Nutrition Goals Re-Evaluation - 01/22/17 0808      Goals   Current Weight  219 lb (99.3 kg)    Nutrition Goal  Meet with Lattie Haw to talk about appetitie loss and how to kick it up a notch.  Also would like to continue to lose weight.     Comment  Jacob Avila missed his appointmet with nutrition.  We will reschedule to happen during class on Jan 30.    Expected Outcome  Short: Meet with Lattie Haw.  Long: Apply some tips to his diet and maintain heart healthy diet.        Psychosocial: Target Goals: Acknowledge presence or absence of significant depression and/or stress, maximize coping skills, provide positive support system. Participant is able to verbalize types and ability to use techniques and skills needed for reducing stress and depression.   Initial Review & Psychosocial Screening: Initial Psych Review & Screening - 11/30/16 1410      Initial Review   Current issues with  Current Sleep Concerns      Family Dynamics   Good Support System?  Yes    Comments  Wife and daughter      Barriers   Psychosocial barriers to participate in program  The patient should benefit from training in stress management and relaxation.      Screening Interventions   Interventions  Yes;Encouraged to exercise;Provide feedback about the scores to participant;Program counselor consult;To provide support and resources with identified psychosocial needs    Expected Outcomes  Short Term  goal: Utilizing psychosocial counselor, staff and physician to assist with identification of specific Stressors or current issues interfering with healing process. Setting desired goal for each stressor or current issue identified.;Long Term Goal: Stressors or current issues are controlled or eliminated.;Short Term goal: Identification and review with participant of any Quality of Life or Depression concerns found by scoring the questionnaire.;Long Term goal: The participant improves quality of Life and PHQ9 Scores as seen by post scores and/or verbalization of changes       Quality of Life Scores:  Quality of Life - 02/15/17 1511      Quality of Life Scores   Health/Function Pre  27.17 %    Health/Function Post  24.4 %    Health/Function % Change  -10.2 %    Socioeconomic Pre  30 %    Socioeconomic Post  27.75 %    Socioeconomic % Change   -7.5 %    Psych/Spiritual Pre  30 %    Psych/Spiritual Post  30 %    Psych/Spiritual % Change  0 %    Family Pre  30 %    Family Post  30 %    Family % Change  0 %    GLOBAL Pre  28.78 %    GLOBAL Post  27.09 %    GLOBAL %  Change  -5.87 %      Scores of 19 and below usually indicate a poorer quality of life in these areas.  A difference of  2-3 points is a clinically meaningful difference.  A difference of 2-3 points in the total score of the Quality of Life Index has been associated with significant improvement in overall quality of life, self-image, physical symptoms, and general health in studies assessing change in quality of life.  PHQ-9: Recent Review Flowsheet Data    Depression screen [facility] 2/9 02/15/2017 02/15/2017 01/25/2017 12/07/2016 11/30/2016   Decreased Interest 2 - _0 Down, Depressed, Hopeless 2 - _1 PHQ - 2 Score 4 - _2 Altered sleeping _3 0 2   Tired, decreased energy 3 - _4 Change in appetite 0 - _5 Feeling bad or failure about yourself  0 - 1 0 0   Trouble concentrating 0 - 0 1 2   Moving slowly or  fidgety/restless 0 - 0 1 1   Suicidal thoughts 0 - 1 0 0   PHQ-9 Score 8 - _6 Difficult doing work/chores - - Somewhat difficult Somewhat difficult Somewhat difficult     Interpretation of Total Score  Total Score Depression Severity:  1-4 = Minimal depression, 5-9 = Mild depression, 10-14 = Moderate depression, 15-19 = Moderately severe depression, 20-27 = Severe depression   Psychosocial Evaluation and Intervention: Psychosocial Evaluation - 02/15/17 0926      Discharge Psychosocial Assessment & Intervention   Comments  Counselor met with Mr. Millirons Jacob Avila) for discharge evaluation.  He reports finishing this program today and beginning with Pulmonary Rehab next Monday.  He plans to walk several times this week to maintain his progress.  Jacob Avila states he is feeling stronger; breathing better; and having more energy overall.  This reportedly impacts his mood as he reports feeling less depressed.  His PHQ-9 dropped several points from "11" to an "8" from moderate down to mild depressive symptoms.  He denies thoughts of self-harm and recognizes he has "much to live for" at the age of 60.  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  He also has a tractor and when the weather breaks to get out and begin some gardening.  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  Counselor commended Jacob Avila on all his progress and hard work.  He is committed to consistent exercise and this has been very positive for him.  Counselor will follow him in Pulmonary Rehab as well.         Psychosocial Re-Evaluation: Psychosocial Re-Evaluation    Row Name 12/16/16 0805 01/22/17 8366 01/25/17 0916 02/03/17 1002       Psychosocial Re-Evaluation   Current issues with  Current Stress Concerns  Current Stress Concerns  Current Stress Concerns  -    Comments  Jacob Avila has been doing well in rehab.  He says class has been hard as he is not used to working this hard. 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  He enjoys working and finds himself to be more content while driving.  His energy is getting better and he feels ready to back to work.Marland Kitchen  He continues to  sleep well.   Jacob Avila has continued to do well in rehab.  He still would like to go back to work, but he is waiting for his doctor to clear him to return.  He spoke with them yesterday and is awaiting a response.  Overall, he is feeling better but he does have days where he doesn't feel 100% still.  He is postivie and has a strong support system.  He has also been sleeping better.  Biggest benefit of rehab has been getting stronger as he was feeling weak.  Also if he could eat better that would help to improve his strength too.   Counselor follow up with Jacob Avila today reporting continues to have appetite problems which are impacting his energy level and mood to some degree.  He states this has been an ongoing concern since his recent cardiac issue wherein he has to "force food."  He is losing a little weight but nothing significant.  Jacob Avila has an appointment to meet with the Dietician later this month re: this.  counselor reviewed Rae's PHQ-9 depression inventory which increased "1" point from a "10" initially to an "11," indicating Jacob Avila may be moderately depressed.  He also stated he has "thoughts" of self harm sometimes and counselor assessed this with Jacob Avila reporting no plan - but because he feels so badly at times with lack of energy and appetite and some sleep issues, these thoughts do occur on occasion.  Jacob Avila also reports he has tried to reach his Dr. to discuss this but is having difficulty connecting.  Counselor and staff will follow with Jacob Avila through the dietician's meeting and recommendations.  Counselor also recommended that Jacob Avila continue to try to reach his Dr. to report these concerns.  He plans to consult with his spouse and his daughter for  concensus on how to move forward on these suggestions.    Counselor met with Jacob Avila today stating his appetite and energy this week are "a little better" as he is working out more consistently.  He was scheduled to meet with the dietician today and staff will follow with him after that occurs.      Expected Outcomes  Short: Talk to doctor about getting back to work.  Long: Continue to enjoy life and maintain positive attitude.   Short: Hear back about going back to work.  Long: Continue to maintain his positive attitude.   Jacob Avila will meet with the dietician about his appetite issues and will continue to try to reach his Cardiologist as well.  Jacob Avila will continue to exercise to increase his stamina and energy levels.  Staff will continue to follow with Jacob Avila.  Jacob Avila will meet with the dietician today to help with his appetite concerns.  He will also continue to exercise consistently to achieve his stated goals.      Interventions  Encouraged to attend Cardiac Rehabilitation for the exercise;Stress management education  -  Stress management education  -    Continue Psychosocial Services   Follow up required by staff  -  Follow up required by staff  Follow up required by staff    Comments  -  Jacob Avila still would like to go back to work if they let him.  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   -  -      Initial Review   Source of Stress Concerns  -  Occupation  -  -       Psychosocial Discharge (Final Psychosocial Re-Evaluation):  Psychosocial Re-Evaluation - 02/03/17 1002      Psychosocial Re-Evaluation   Comments  Counselor met with Jacob Avila today stating his appetite and energy this week are "a little better" as he is working out more consistently.  He was scheduled to meet with the dietician today and staff will follow with him after that occurs.      Expected Outcomes  Jacob Avila will meet with the dietician today to help with his appetite concerns.  He will also continue to exercise consistently to achieve his stated  goals.      Continue Psychosocial Services   Follow up required by staff       Vocational Rehabilitation: Provide vocational rehab assistance to qualifying candidates.   Vocational Rehab Evaluation & Intervention: Vocational Rehab - 11/30/16 1416      Initial Vocational Rehab Evaluation & Intervention   Assessment shows need for Vocational Rehabilitation  No       Education: Education Goals: Education classes will be provided on a variety of topics geared toward better understanding of heart health and risk factor modification. Participant will state understanding/return demonstration of topics presented as noted by education test scores.  Learning Barriers/Preferences: Learning Barriers/Preferences - 11/30/16 1415      Learning Barriers/Preferences   Learning Barriers  Hearing    Learning Preferences  Verbal Instruction;Skilled Demonstration       Education Topics:  AED/CPR: - Group verbal and written instruction with the use of models to demonstrate the basic use of the AED with the basic ABC's of resuscitation.   Cardiac Rehab from 02/15/2017 in [facility] Cardiac and Pulmonary Rehab  Date  02/15/17  Educator  SB  Instruction Review Code  1- Verbalizes Understanding      General Nutrition Guidelines/Fats and Fiber: -Group instruction provided by verbal, written material, models and posters to present the general guidelines for heart healthy nutrition. Gives an explanation and review of dietary fats and fiber.   Cardiac Rehab from 02/15/2017 in [facility] Cardiac and Pulmonary Rehab  Date  02/01/17  Educator  CR  Instruction Review Code  1- Verbalizes Understanding      Controlling Sodium/Reading Food Labels: -Group verbal and written material supporting the discussion of sodium use in heart healthy nutrition. Review and explanation with models, verbal and written materials for utilization of the food label.   Cardiac Rehab from 02/15/2017 in [facility] Cardiac and Pulmonary Rehab   Date  02/08/17  Educator  PI  Instruction Review Code  1- Verbalizes Understanding      Exercise Physiology & General Exercise Guidelines: - Group verbal and written instruction with models to review the exercise physiology of the cardiovascular system and associated critical values. Provides general exercise guidelines with specific guidelines to those with heart or lung disease.    Cardiac Rehab from 02/15/2017 in [facility] Cardiac and Pulmonary Rehab  Date  12/30/16  Educator  [facility]  Instruction Review Code  1- Verbalizes Understanding      Aerobic Exercise & Resistance Training: - Gives group verbal and written instruction on the various components of exercise. Focuses on aerobic and resistive training programs and the benefits of this training and how to safely progress through these programs..   Cardiac Rehab from 02/15/2017 in [facility] Cardiac and Pulmonary Rehab  Date  01/06/17  Educator  [facility]  Instruction Review Code  1- Verbalizes Understanding      Flexibility, Balance, Mind/Body Relaxation: Provides group verbal/written instruction on the benefits of flexibility and balance training, including mind/body exercise modes  such as yoga, pilates and tai chi.  Demonstration and skill practice provided.   Cardiac Rehab from 02/15/2017 in [facility] Cardiac and Pulmonary Rehab  Date  01/11/17  Educator  [facility]  Instruction Review Code  1- Verbalizes Understanding      Stress and Anxiety: - Provides group verbal and written instruction about the health risks of elevated stress and causes of high stress.  Discuss the correlation between heart/lung disease and anxiety and treatment options. Review healthy ways to manage with stress and anxiety.   Depression: - Provides group verbal and written instruction on the correlation between heart/lung disease and depressed mood, treatment options, and the stigmas associated with seeking treatment.   Cardiac Rehab from 02/15/2017 in [facility] Cardiac and Pulmonary  Rehab  Date  12/16/16  Educator  [facility]  Instruction Review Code  1- Verbalizes Understanding      Anatomy & Physiology of the Heart: - Group verbal and written instruction and models provide basic cardiac anatomy and physiology, with the coronary electrical and arterial systems. Review of Valvular disease and Heart Failure   Cardiac Rehab from 02/15/2017 in [facility] Cardiac and Pulmonary Rehab  Date  01/25/17  Educator  [facility]  Instruction Review Code  1- Verbalizes Understanding      Cardiac Procedures: - Group verbal and written instruction to review commonly prescribed medications for heart disease. Reviews the medication, class of the drug, and side effects. Includes the steps to properly store meds and maintain the prescription regimen. (beta blockers and nitrates)   Cardiac Rehab from 02/15/2017 in [facility] Cardiac and Pulmonary Rehab  Date  02/03/17  Educator  SB  Instruction Review Code  1- Verbalizes Understanding      Cardiac Medications I: - Group verbal and written instruction to review commonly prescribed medications for heart disease. Reviews the medication, class of the drug, and side effects. Includes the steps to properly store meds and maintain the prescription regimen.   Cardiac Rehab from 02/15/2017 in [facility] Cardiac and Pulmonary Rehab  Date  01/18/17  Educator  [place]  Instruction Review Code  1- Verbalizes Understanding      Cardiac Medications II: -Group verbal and written instruction to review commonly prescribed medications for heart disease. Reviews the medication, class of the drug, and side effects. (all other drug classes)    Go Sex-Intimacy & Heart Disease, Get SMART - Goal Setting: - Group verbal and written instruction through game format to discuss heart disease and the return to sexual intimacy. Provides group verbal and written material to discuss and apply goal setting through the application of the S.M.A.R.T. Method.   Cardiac Rehab from 02/15/2017 in [facility]  Cardiac and Pulmonary Rehab  Date  02/03/17  Educator  SB  Instruction Review Code  1- Verbalizes Understanding      Other Matters of the Heart: - Provides group verbal, written materials and models to describe Stable Angina and Peripheral Artery. Includes description of the disease process and treatment options available to the cardiac patient.   Exercise & Equipment Safety: - Individual verbal instruction and demonstration of equipment use and safety with use of the equipment.   Cardiac Rehab from 02/15/2017 in [facility] Cardiac and Pulmonary Rehab  Date  11/30/16  Educator  [place]  Instruction Review Code  1- Verbalizes Understanding      Infection Prevention: - Provides verbal and written material to individual with discussion of infection control including proper hand washing and proper equipment cleaning during exercise session.   Cardiac Rehab from  02/15/2017 in [facility] Cardiac and Pulmonary Rehab  Date  11/30/16  Educator  [place]  Instruction Review Code  1- Verbalizes Understanding      Falls Prevention: - Provides verbal and written material to individual with discussion of falls prevention and safety.   Cardiac Rehab from 02/15/2017 in [facility] Cardiac and Pulmonary Rehab  Date  11/30/16  Educator  [place]  Instruction Review Code  1- Verbalizes Understanding      Diabetes: - Individual verbal and written instruction to review signs/symptoms of diabetes, desired ranges of glucose level fasting, after meals and with exercise. Acknowledge that pre and post exercise glucose checks will be done for 3 sessions at entry of program.   Know Your Numbers and Risk Factors: -Group verbal and written instruction about important numbers in your health.  Discussion of what are risk factors and how they play a role in the disease process.  Review of Cholesterol, Blood Pressure, Diabetes, and BMI and the role they play in your overall health.   Sleep Hygiene: -Provides group verbal and written  instruction about how sleep can affect your health.  Define sleep hygiene, discuss sleep cycles and impact of sleep habits. Review good sleep hygiene tips.    Other: -Provides group and verbal instruction on various topics (see comments)   Knowledge Questionnaire Score: Knowledge Questionnaire Score - 02/15/17 1514      Knowledge Questionnaire Score   Pre Score  17/18 Pulmonary        Core Components/Risk Factors/Patient Goals at Admission: Personal Goals and Risk Factors at Admission - 11/30/16 1406      Core Components/Risk Factors/Patient Goals on Admission    Weight Management  Yes;Weight Loss    Intervention  Weight Management: Develop a combined nutrition and exercise program designed to reach desired caloric intake, while maintaining appropriate intake of nutrient and fiber, sodium and fats, and appropriate energy expenditure required for the weight goal.;Weight Management: Provide education and appropriate resources to help participant work on and attain dietary goals. patient states he has lost weight recently without trying due to no appetite and not eating much.     Admit Weight  221 lb (100.2 kg)    Goal Weight: Short Term  215 lb (97.5 kg)    Goal Weight: Long Term  211 lb (95.7 kg)    Expected Outcomes  Long Term: Adherence to nutrition and physical activity/exercise program aimed toward attainment of established weight goal;Understanding recommendations for meals to include 15-35% energy as protein, 25-35% energy from fat, 35-60% energy from carbohydrates, less than 247m of dietary cholesterol, 20-35 gm of total fiber daily;Understanding of distribution of calorie intake throughout the day with the consumption of 4-5 meals/snacks;Short Term: Continue to assess and modify interventions until short term weight is achieved;Weight Loss: Understanding of general recommendations for a balanced deficit meal plan, which promotes 1-2 lb weight loss per week and includes a negative  energy balance of [phone] kcal/d    Improve shortness of breath with ADL's  Yes    Intervention  Provide education, individualized exercise plan and daily activity instruction to help decrease symptoms of SOB with activities of daily living.    Expected Outcomes  Short Term: Achieves a reduction of symptoms when performing activities of daily living.    Develop more efficient breathing techniques such as purse lipped breathing and diaphragmatic breathing; and practicing self-pacing with activity  Yes    Intervention  Provide education, demonstration and support about specific breathing techniuqes utilized for more efficient  breathing. Include techniques such as pursed lipped breathing, diaphragmatic breathing and self-pacing activity.    Expected Outcomes  Short Term: Participant will be able to demonstrate and use breathing techniques as needed throughout daily activities.    Heart Failure  Yes    Intervention  Provide a combined exercise and nutrition program that is supplemented with education, support and counseling about heart failure. Directed toward relieving symptoms such as shortness of breath, decreased exercise tolerance, and extremity edema.    Expected Outcomes  Improve functional capacity of life;Short term: Attendance in program 2-3 days a week with increased exercise capacity. Reported lower sodium intake. Reported increased fruit and vegetable intake. Reports medication compliance.;Short term: Daily weights obtained and reported for increase. Utilizing diuretic protocols set by physician.;Long term: Adoption of self-care skills and reduction of barriers for early signs and symptoms recognition and intervention leading to self-care maintenance.    Hypertension  Yes    Intervention  Provide education on lifestyle modifcations including regular physical activity/exercise, weight management, moderate sodium restriction and increased consumption of fresh fruit, vegetables, and low fat dairy,  alcohol moderation, and smoking cessation.;Monitor prescription use compliance.    Expected Outcomes  Short Term: Continued assessment and intervention until BP is < 140/65m HG in hypertensive participants. < 130/858mHG in hypertensive participants with diabetes, heart failure or chronic kidney disease.;Long Term: Maintenance of blood pressure at goal levels.    Lipids  Yes    Intervention  Provide education and support for participant on nutrition & aerobic/resistive exercise along with prescribed medications to achieve LDL <7028mHDL >53m52m  Expected Outcomes  Short Term: Participant states understanding of desired cholesterol values and is compliant with medications prescribed. Participant is following exercise prescription and nutrition guidelines.;Long Term: Cholesterol controlled with medications as prescribed, with individualized exercise RX and with personalized nutrition plan. Value goals: LDL < 70mg55mL > 40 mg.    Stress  Yes    Intervention  Offer individual and/or small group education and counseling on adjustment to heart disease, stress management and health-related lifestyle change. Teach and support self-help strategies.;Refer participants experiencing significant psychosocial distress to appropriate mental health specialists for further evaluation and treatment. When possible, include family members and significant others in education/counseling sessions.    Expected Outcomes  Short Term: Participant demonstrates changes in health-related behavior, relaxation and other stress management skills, ability to obtain effective social support, and compliance with psychotropic medications if prescribed.;Long Term: Emotional wellbeing is indicated by absence of clinically significant psychosocial distress or social isolation.       Core Components/Risk Factors/Patient Goals Review:  Goals and Risk Factor Review    Row Name 12/16/16 0800 01/22/17 0805           Core Components/Risk  Factors/Patient Goals Review   Personal Goals Review  Weight Management/Obesity;Heart Failure;Hypertension;Lipids;Improve shortness of breath with ADL's  Weight Management/Obesity;Heart Failure;Hypertension;Lipids;Improve shortness of breath with ADL's      Review  Rae iDwyane Luoff to a good start.  His weight was up 1/2 lbs today as he was eating during the snow days.  His breathing is already starting to get better.  He has not had symptoms of his heart failure.  He weighs daily and watches his sodium intake.  His  blood pressures have been good and he checks them at home on occasion.  He has not had any problems with his medications.   Rae cDwyane Luoinues to do well in rehab.  He is down to 219 Lbs  today.  His appetite has not been good and he feels that he is not eating enough.  We will have him talk with nutrition again.  His breathing is getting better and he is able to do more around the house.  He has continues to do better with his heart failure, no swelling, just the shortness of breath.  He continues to watch his sodium intake and weighing daily.  He continues to do well with his blood pressures and check them at home.       Expected Outcomes  Short: Continue to work on weight loss and check blood pressures a little more frequently.  Long: Continue to work risk factor modifications.   Short: Continue to work on weight loss and improving his SOB.  Long: Continue to work on risk factors.          Core Components/Risk Factors/Patient Goals at Discharge (Final Review):  Goals and Risk Factor Review - 01/22/17 0805      Core Components/Risk Factors/Patient Goals Review   Personal Goals Review  Weight Management/Obesity;Heart Failure;Hypertension;Lipids;Improve shortness of breath with ADL's    Review  Jacob Avila continues to do well in rehab.  He is down to 219 Lbs today.  His appetite has not been good and he feels that he is not eating enough.  We will have him talk with nutrition again.  His breathing is getting  better and he is able to do more around the house.  He has continues to do better with his heart failure, no swelling, just the shortness of breath.  He continues to watch his sodium intake and weighing daily.  He continues to do well with his blood pressures and check them at home.     Expected Outcomes  Short: Continue to work on weight loss and improving his SOB.  Long: Continue to work on risk factors.        ITP Comments: ITP Comments    Row Name 11/30/16 1358 12/09/16 0545 12/16/16 1414 01/06/17 0632 01/18/17 0917   ITP Comments  Medical Review Completed; initial ITP created. Diagnosis Documentation can be found in [place] encounter dated 11/07/2016.  30 day review. Continue with ITP unless directed changes per Medical Director review.   Note sent with EKG strips to Dr Kandis Cocking regarding Rea's heartrate being eleveated above 100 during his visits. Getting as high as 160 during exercise exertion.   30 day review. Continue with ITP unless directed changes per Medical Director review.   No changes per MD after office visit  Jacob Avila had several PVC runs, which is consistant with his rhythm history, but today there seemed to be more than usual. He stated that he felt fine and took all his meds. RN will send today's rhythm strips to his cardiologist.    Row Name 01/25/17 0934 01/26/17 1428 02/01/17 0855 02/03/17 0558 02/15/17 0807   ITP Comments  Jacob Avila mentioned that he was not feeling well today.  He couldn't really pinpoint anything in particular, just not feeling well.  He sat through class without at problem.  Jacob Avila was able to exercise today without a problem, but he just stayed on the NuStep today for the whole time.   [facility] in [facility] discharge planner called that [place] was NOT going to have home health but that he was being sent home on home supplemental oxgyen for recent history of pneumonia. . I suggested that she ask the MD to have [facility] since it  is  basically the same staff in the same gym.   Jacob Avila was cleared to return to rehab after his hospitalization.  Documentation found in discharge summary from [facility] visit from ED 01/25/17 in Care Everywhere.  He is now on oxygen therapy for rest and exercise.  Oxygen saturations stayed above 90% during exercise today.   30 Day review. Continue with ITP unless directed changes per Medical Director review.   Discharge ITP sent and signed by Dr. Sabra Heck.  Discharge Summary routed to PCP and cardiologist.     Comments: Discharge ITP

## 2017-02-15 NOTE — Progress Notes (Signed)
Discharge Progress Report  Patient Details  Name: Jacob Avila MRN: [id number] Date of Birth: 1927/12/25 Referring Provider:     Cardiac Rehab from 11/30/2016 in [facility]  Referring Provider  Mora Appl MD       Number of Visits: 36/36  Reason for Discharge:  Patient reached a stable level of exercise. Patient independent in their exercise. Patient has met program and personal goals.  Smoking History:  Social History   Tobacco Use  Smoking Status Former Smoker  . Types: Cigarettes  . Last attempt to quit: 01/05/1978  . Years since quitting: 39.1  Smokeless Tobacco Never Used    Diagnosis:  Status post coronary artery stent placement  ADL UCSD: Pulmonary Assessment Scores    Row Name 02/15/17 1514         ADL UCSD   ADL Phase  Entry     SOB Score total  80     Rest  2     Walk  3     Stairs  5     Bath  2     Dress  3     Shop  4       CAT Score   CAT Score  28        Initial Exercise Prescription: Initial Exercise Prescription - 11/30/16 1400      Date of Initial Exercise RX and Referring Provider   Date  11/30/16    Referring Provider  Mora Appl MD      Treadmill   MPH  2.3    Grade  0    Minutes  15    METs  2.76      Recumbant Bike   Level  1    RPM  50    Minutes  15    METs  2.5      NuStep   Level  2    SPM  80    Minutes  15    METs  2.5      Prescription Details   Frequency (times per week)  3    Duration  Progress to 45 minutes of aerobic exercise without signs/symptoms of physical distress      Intensity   THRR 40-80% of Max Heartrate  119-129    Ratings of Perceived Exertion  11-13    Perceived Dyspnea  0-4      Progression   Progression  Continue to progress workloads to maintain intensity without signs/symptoms of physical distress.      Resistance Training   Training Prescription  Yes    Weight  3 lbs    Reps  10-15       Discharge Exercise Prescription (Final  Exercise Prescription Changes): Exercise Prescription Changes - 02/10/17 1400      Response to Exercise   Blood Pressure (Admit)  112/72    Blood Pressure (Exercise)  122/62    Blood Pressure (Exit)  132/64    Heart Rate (Admit)  125 bpm    Heart Rate (Exercise)  150 bpm    Heart Rate (Exit)  102 bpm    Rating of Perceived Exertion (Exercise)  12    Symptoms  none    Duration  Continue with 45 min of aerobic exercise without signs/symptoms of physical distress.    Intensity  THRR unchanged      Progression   Progression  Continue to progress workloads to maintain intensity without signs/symptoms of physical distress.  Average METs  2.96      Resistance Training   Training Prescription  Yes    Weight  4 lbs    Reps  10-15      Interval Training   Interval Training  No      Recumbant Bike   Level  4    Watts  22    Minutes  15    METs  2.79      NuStep   Level  6    Minutes  15    METs  3.7      Track   Laps  30    Minutes  15    METs  2.38      Home Exercise Plan   Plans to continue exercise at  Home (comment) walking    Frequency  Add 2 additional days to program exercise sessions.    Initial Home Exercises Provided  12/16/16       Functional Capacity: 6 Minute Walk    Row Name 11/30/16 1452 02/03/17 0905 02/05/17 0923     6 Minute Walk   Phase  Initial  Discharge  Discharge   Distance  1265 feet  1160 feet now wearing O2 continuously  1530 feet   Distance % Change  -  -8.3 %  21 %   Distance Feet Change  -  -105 ft  265 ft   Walk Time  6 minutes  6 minutes  6 minutes   # of Rest Breaks  0  0  0   MPH  2.4  2.2  2.89   METS  2.74  2.42  3.43   RPE  13  15  15    Perceived Dyspnea   1  1  -   VO2 Peak  9.6  8.46  12   Symptoms  Yes (comment)  Yes (comment)  -   Comments  SOB  SOB  -   Resting HR  109 bpm  94 bpm  -   Resting BP  116/64  106/60  -   Resting Oxygen Saturation   99 %  95 %  -   Exercise Oxygen Saturation  during 6 min walk  97 %  92  %  -   Max Ex. HR  152 bpm  150 bpm  172 bpm   Max Ex. BP  160/74  146/70  152/84   2 Minute Post BP  124/64  -  -     Interval HR   1 Minute HR  -  126  -   2 Minute HR  -  137  -   3 Minute HR  -  130  -   4 Minute HR  -  148  -   5 Minute HR  -  143  -   6 Minute HR  -  146  -   Interval Heart Rate?  -  Yes  -     Interval Oxygen   Interval Oxygen?  -  Yes  -   Baseline Oxygen Saturation %  -  95 %  -   1 Minute Oxygen Saturation %  -  96 %  -   1 Minute Liters of Oxygen  -  2 L  -   2 Minute Oxygen Saturation %  -  96 %  -   2 Minute Liters of Oxygen  -  2 L  -   3 Minute Oxygen Saturation %  -  93 %  -   3 Minute Liters of Oxygen  -  2 L  -   4 Minute Oxygen Saturation %  -  97 %  -   4 Minute Liters of Oxygen  -  2 L  -   5 Minute Oxygen Saturation %  -  92 %  -   5 Minute Liters of Oxygen  -  2 L  -   6 Minute Oxygen Saturation %  -  94 %  -   6 Minute Liters of Oxygen  -  2 L  -      Psychological, QOL, Others - Outcomes: PHQ 2/9: Depression screen [facility] 2/9 02/15/2017 02/15/2017 01/25/2017 12/07/2016 11/30/2016  Decreased Interest 2 - 2 2 2   Down, Depressed, Hopeless 2 - 1 1 2   PHQ - 2 Score 4 - 3 3 4   Altered sleeping 1 1 1  0 2  Tired, decreased energy 3 - 2 2 3   Change in appetite 0 - 3 3 3   Feeling bad or failure about yourself  0 - 1 0 0  Trouble concentrating 0 - 0 1 2  Moving slowly or fidgety/restless 0 - 0 1 1  Suicidal thoughts 0 - 1 0 0  PHQ-9 Score 8 - 11 10 15   Difficult doing work/chores - - Somewhat difficult Somewhat difficult Somewhat difficult    Quality of Life: Quality of Life - 02/15/17 1511      Quality of Life Scores   Health/Function Pre  27.17 %    Health/Function Post  24.4 %    Health/Function % Change  -10.2 %    Socioeconomic Pre  30 %    Socioeconomic Post  27.75 %    Socioeconomic % Change   -7.5 %    Psych/Spiritual Pre  30 %    Psych/Spiritual Post  30 %    Psych/Spiritual % Change  0 %    Family Pre  30 %    Family  Post  30 %    Family % Change  0 %    GLOBAL Pre  28.78 %    GLOBAL Post  27.09 %    GLOBAL % Change  -5.87 %       Personal Goals: Goals established at orientation with interventions provided to work toward goal. Personal Goals and Risk Factors at Admission - 11/30/16 1406      Core Components/Risk Factors/Patient Goals on Admission    Weight Management  Yes;Weight Loss    Intervention  Weight Management: Develop a combined nutrition and exercise program designed to reach desired caloric intake, while maintaining appropriate intake of nutrient and fiber, sodium and fats, and appropriate energy expenditure required for the weight goal.;Weight Management: Provide education and appropriate resources to help participant work on and attain dietary goals. patient states he has lost weight recently without trying due to no appetite and not eating much.     Admit Weight  221 lb (100.2 kg)    Goal Weight: Short Term  215 lb (97.5 kg)    Goal Weight: Long Term  211 lb (95.7 kg)    Expected Outcomes  Long Term: Adherence to nutrition and physical activity/exercise program aimed toward attainment of established weight goal;Understanding recommendations for meals to include 15-35% energy as protein, 25-35% energy from fat, 35-60% energy from carbohydrates, less than 24m of dietary cholesterol, 20-35 gm of total fiber daily;Understanding of distribution of calorie intake throughout the day with the consumption of  4-5 meals/snacks;Short Term: Continue to assess and modify interventions until short term weight is achieved;Weight Loss: Understanding of general recommendations for a balanced deficit meal plan, which promotes 1-2 lb weight loss per week and includes a negative energy balance of [phone] kcal/d    Improve shortness of breath with ADL's  Yes    Intervention  Provide education, individualized exercise plan and daily activity instruction to help decrease symptoms of SOB with activities of daily  living.    Expected Outcomes  Short Term: Achieves a reduction of symptoms when performing activities of daily living.    Develop more efficient breathing techniques such as purse lipped breathing and diaphragmatic breathing; and practicing self-pacing with activity  Yes    Intervention  Provide education, demonstration and support about specific breathing techniuqes utilized for more efficient breathing. Include techniques such as pursed lipped breathing, diaphragmatic breathing and self-pacing activity.    Expected Outcomes  Short Term: Participant will be able to demonstrate and use breathing techniques as needed throughout daily activities.    Heart Failure  Yes    Intervention  Provide a combined exercise and nutrition program that is supplemented with education, support and counseling about heart failure. Directed toward relieving symptoms such as shortness of breath, decreased exercise tolerance, and extremity edema.    Expected Outcomes  Improve functional capacity of life;Short term: Attendance in program 2-3 days a week with increased exercise capacity. Reported lower sodium intake. Reported increased fruit and vegetable intake. Reports medication compliance.;Short term: Daily weights obtained and reported for increase. Utilizing diuretic protocols set by physician.;Long term: Adoption of self-care skills and reduction of barriers for early signs and symptoms recognition and intervention leading to self-care maintenance.    Hypertension  Yes    Intervention  Provide education on lifestyle modifcations including regular physical activity/exercise, weight management, moderate sodium restriction and increased consumption of fresh fruit, vegetables, and low fat dairy, alcohol moderation, and smoking cessation.;Monitor prescription use compliance.    Expected Outcomes  Short Term: Continued assessment and intervention until BP is < 140/68m HG in hypertensive participants. < 130/846mHG in  hypertensive participants with diabetes, heart failure or chronic kidney disease.;Long Term: Maintenance of blood pressure at goal levels.    Lipids  Yes    Intervention  Provide education and support for participant on nutrition & aerobic/resistive exercise along with prescribed medications to achieve LDL <7059mHDL >2m37m  Expected Outcomes  Short Term: Participant states understanding of desired cholesterol values and is compliant with medications prescribed. Participant is following exercise prescription and nutrition guidelines.;Long Term: Cholesterol controlled with medications as prescribed, with individualized exercise RX and with personalized nutrition plan. Value goals: LDL < 70mg54mL > 40 mg.    Stress  Yes    Intervention  Offer individual and/or small group education and counseling on adjustment to heart disease, stress management and health-related lifestyle change. Teach and support self-help strategies.;Refer participants experiencing significant psychosocial distress to appropriate mental health specialists for further evaluation and treatment. When possible, include family members and significant others in education/counseling sessions.    Expected Outcomes  Short Term: Participant demonstrates changes in health-related behavior, relaxation and other stress management skills, ability to obtain effective social support, and compliance with psychotropic medications if prescribed.;Long Term: Emotional wellbeing is indicated by absence of clinically significant psychosocial distress or social isolation.        Personal Goals Discharge: Goals and Risk Factor Review    Row Name 12/16/16 0800 01/22/17 [id number]  Core Components/Risk Factors/Patient Goals Review   Personal Goals Review  Weight Management/Obesity;Heart Failure;Hypertension;Lipids;Improve shortness of breath with ADL's  Weight Management/Obesity;Heart Failure;Hypertension;Lipids;Improve shortness of breath with ADL's       Review  [name] is off to a good start.  His weight was up 1/2 lbs today as he was eating during the snow days.  His breathing is already starting to get better.  He has not had symptoms of his heart failure.  He weighs daily and watches his sodium intake.  His  blood pressures have been good and he checks them at home on occasion.  He has not had any problems with his medications.   [name] continues to do well in rehab.  He is down to 219 Lbs today.  His appetite has not been good and he feels that he is not eating enough.  We will have him talk with nutrition again.  His breathing is getting better and he is able to do more around the house.  He has continues to do better with his heart failure, no swelling, just the shortness of breath.  He continues to watch his sodium intake and weighing daily.  He continues to do well with his blood pressures and check them at home.       Expected Outcomes  Short: Continue to work on weight loss and check blood pressures a little more frequently.  Long: Continue to work risk factor modifications.   Short: Continue to work on weight loss and improving his SOB.  Long: Continue to work on risk factors.          Exercise Goals and Review: Exercise Goals    Row Name 11/30/16 1503             Exercise Goals   Increase Physical Activity  Yes       Intervention  Provide advice, education, support and counseling about physical activity/exercise needs.;Develop an individualized exercise prescription for aerobic and resistive training based on initial evaluation findings, risk stratification, comorbidities and participant's personal goals.       Expected Outcomes  Achievement of increased cardiorespiratory fitness and enhanced flexibility, muscular endurance and strength shown through measurements of functional capacity and personal statement of participant.       Increase Strength and Stamina  Yes       Intervention  Provide advice, education, support and counseling  about physical activity/exercise needs.;Develop an individualized exercise prescription for aerobic and resistive training based on initial evaluation findings, risk stratification, comorbidities and participant's personal goals.       Expected Outcomes  Achievement of increased cardiorespiratory fitness and enhanced flexibility, muscular endurance and strength shown through measurements of functional capacity and personal statement of participant.       Able to understand and use rate of perceived exertion (RPE) scale  Yes       Intervention  Provide education and explanation on how to use RPE scale       Expected Outcomes  Short Term: Able to use RPE daily in rehab to express subjective intensity level;Long Term:  Able to use RPE to guide intensity level when exercising independently       Knowledge and understanding of Target Heart Rate Range (THRR)  Yes       Intervention  Provide education and explanation of THRR including how the numbers were predicted and where they are located for reference       Expected Outcomes  Short Term: Able to state/look up THRR;Long Term:  Able to use THRR to govern intensity when exercising independently;Short Term: Able to use daily as guideline for intensity in rehab       Able to check pulse independently  Yes       Intervention  Provide education and demonstration on how to check pulse in carotid and radial arteries.;Review the importance of being able to check your own pulse for safety during independent exercise       Expected Outcomes  Short Term: Able to explain why pulse checking is important during independent exercise;Long Term: Able to check pulse independently and accurately       Understanding of Exercise Prescription  Yes       Intervention  Provide education, explanation, and written materials on patient's individual exercise prescription       Expected Outcomes  Short Term: Able to explain program exercise prescription;Long Term: Able to explain home  exercise prescription to exercise independently          Nutrition & Weight - Outcomes: Pre Biometrics - 11/30/16 1504      Pre Biometrics   Height  6' 0.5" (1.842 m)    Weight  221 lb (100.2 kg)    Waist Circumference  39 inches    Hip Circumference  43 inches    Waist to Hip Ratio  0.91 %    BMI (Calculated)  29.54    Single Leg Stand  1.65 seconds      Post Biometrics - 02/03/17 3762       Post  Biometrics   Height  6' 0.5" (1.842 m)    Weight  211 lb (95.7 kg)    Waist Circumference  38 inches    Hip Circumference  43 inches    Waist to Hip Ratio  0.88 %    BMI (Calculated)  28.21    Single Leg Stand  1.66 seconds       Nutrition: Nutrition Therapy & Goals - 02/03/17 0922      Nutrition Therapy   Diet  TLC    Drug/Food Interactions  Statins/Certain Fruits    Protein (specify units)  9oz    Fiber  36 grams    Saturated Fats  16 max. grams    Fruits and Vegetables  6 servings/day    Sodium  2000 grams      Personal Nutrition Goals   Nutrition Goal  Choose foods with strong flavors "twang" and aromas to help stimulate appetite    Personal Goal #2  Continue to drink Ensure supplement drinks on a regular basis until appetite improves    Personal Goal #3  Eat a source of protein at each meal and snack      Intervention Plan   Intervention  Prescribe, educate and counsel regarding individualized specific dietary modifications aiming towards targeted core components such as weight, hypertension, lipid management, diabetes, heart failure and other comorbidities.    Expected Outcomes  Short Term Goal: Understand basic principles of dietary content, such as calories, fat, sodium, cholesterol and nutrients.;Short Term Goal: A plan has been developed with personal nutrition goals set during dietitian appointment.;Long Term Goal: Adherence to prescribed nutrition plan.       Nutrition Discharge: Nutrition Assessments - 02/15/17 1513      MEDFICTS Scores   Pre Score  6     Post Score  30    Score Difference  24       Education Questionnaire Score: Knowledge Questionnaire Score - 02/15/17 1514  Knowledge Questionnaire Score   Pre Score  17/18 Pulmonary        Goals reviewed with patient; copy given to patient.

## 2017-02-15 NOTE — Progress Notes (Signed)
Daily Session Note  Patient Details  Name: Jacob Avila MRN: [id number] Date of Birth: 01/18/1927 Referring Provider:     Cardiac Rehab from 11/30/2016 in [facility]  Referring Provider  Jacob Appl MD      Encounter Date: 02/15/2017  Check In: Session Check In - 02/15/17 0807      Check-In   Location  [facility]    Staff Present  Jacob Sam, MA, RCEP, CCRP, Exercise Physiologist;Jacob Avila, BS, ACSM CEP, Exercise Physiologist;Jacob Bice, RN, BSN, CCRP    Supervising physician immediately available to respond to emergencies  See telemetry face sheet for immediately available ER MD    Medication changes reported      No    Fall or balance concerns reported     No    Warm-up and Cool-down  Performed on first and last piece of equipment    Resistance Training Performed  Yes    VAD Patient?  No      Pain Assessment   Currently in Pain?  No/denies          Social History   Tobacco Use  Smoking Status Former Smoker  . Types: Cigarettes  . Last attempt to quit: 01/05/1978  . Years since quitting: 39.1  Smokeless Tobacco Never Used    Goals Met:  Independence with exercise equipment Exercise tolerated well Personal goals reviewed No report of cardiac concerns or symptoms Strength training completed today  Goals Unmet:  Not Applicable  Comments:  Jacob Avila graduated today from cardiac rehab with 36 sessions completed.  Details of the patient's exercise prescription and what He needs to do in order to continue the prescription and progress were discussed with patient.  Patient was given a copy of prescription and goals.  Patient verbalized understanding.  Jacob Avila plans to continue to exercise by coming into Pulmonary Rehab.    Dr. Emily Avila is Medical Director for [place] and [facility].

## 2017-02-15 NOTE — Patient Instructions (Addendum)
Patient Instructions  Patient Details  Name: Jacob Avila MRN: [id number] Date of Birth: 05-22-1927 Referring Provider:  Sandria Bales, MD  Below are your personal goals for exercise, nutrition, and risk factors. Our goal is to help you stay on track towards obtaining and maintaining these goals. We will be discussing your progress on these goals with you throughout the program.  Initial Exercise Prescription: Initial Exercise Prescription - 02/18/17 1200      Date of Initial Exercise RX and Referring Provider   Date  02/18/17 Starting Pulmonary on 2/18    Referring Provider  Deon Pilling      Oxygen   Oxygen  Continuous    Liters  2      Recumbant Bike   Level  4    RPM  30    Watts  20    Minutes  15    METs  2.79      NuStep   Level  6    SPM  80    Minutes  15    METs  3.5      Track   Laps  30    Minutes  15    METs  2.38      Prescription Details   Frequency (times per week)  3    Duration  Progress to 45 minutes of aerobic exercise without signs/symptoms of physical distress      Intensity   THRR 40-80% of Max Heartrate  110-125    Ratings of Perceived Exertion  11-13    Perceived Dyspnea  0-4      Progression   Progression  Continue to progress workloads to maintain intensity without signs/symptoms of physical distress.      Resistance Training   Training Prescription  Yes    Weight  4 lbs    Reps  10-15       Exercise Goals: Frequency: Be able to perform aerobic exercise two to three times per week in program working toward 2-5 days per week of home exercise.  Intensity: Work with a perceived exertion of 11 (fairly light) - 15 (hard) while following your exercise prescription.  We will make changes to your prescription with you as you progress through the program.   Duration: Be able to do 30 to 45 minutes of continuous aerobic exercise in addition to a 5 minute warm-up and a 5 minute cool-down routine.   Nutrition Goals: Your  personal nutrition goals will be established when you do your nutrition analysis with the dietician.  The following are general nutrition guidelines to follow: Cholesterol < 200mg /day Sodium < 1500mg /day Fiber: Men over 50 yrs - 30 grams per day  Personal Goals: Personal Goals and Risk Factors at Admission - 11/30/16 1406      Core Components/Risk Factors/Patient Goals on Admission    Weight Management  Yes;Weight Loss    Intervention  Weight Management: Develop a combined nutrition and exercise program designed to reach desired caloric intake, while maintaining appropriate intake of nutrient and fiber, sodium and fats, and appropriate energy expenditure required for the weight goal.;Weight Management: Provide education and appropriate resources to help participant work on and attain dietary goals. patient states he has lost weight recently without trying due to no appetite and not eating much.     Admit Weight  221 lb (100.2 kg)    Goal Weight: Short Term  215 lb (97.5 kg)    Goal Weight: Long Term  211 lb (95.7 kg)  Expected Outcomes  Long Term: Adherence to nutrition and physical activity/exercise program aimed toward attainment of established weight goal;Understanding recommendations for meals to include 15-35% energy as protein, 25-35% energy from fat, 35-60% energy from carbohydrates, less than 200mg  of dietary cholesterol, 20-35 gm of total fiber daily;Understanding of distribution of calorie intake throughout the day with the consumption of 4-5 meals/snacks;Short Term: Continue to assess and modify interventions until short term weight is achieved;Weight Loss: Understanding of general recommendations for a balanced deficit meal plan, which promotes 1-2 lb weight loss per week and includes a negative energy balance of [phone] kcal/d    Improve shortness of breath with ADL's  Yes    Intervention  Provide education, individualized exercise plan and daily activity instruction to help  decrease symptoms of SOB with activities of daily living.    Expected Outcomes  Short Term: Achieves a reduction of symptoms when performing activities of daily living.    Develop more efficient breathing techniques such as purse lipped breathing and diaphragmatic breathing; and practicing self-pacing with activity  Yes    Intervention  Provide education, demonstration and support about specific breathing techniuqes utilized for more efficient breathing. Include techniques such as pursed lipped breathing, diaphragmatic breathing and self-pacing activity.    Expected Outcomes  Short Term: Participant will be able to demonstrate and use breathing techniques as needed throughout daily activities.    Heart Failure  Yes    Intervention  Provide a combined exercise and nutrition program that is supplemented with education, support and counseling about heart failure. Directed toward relieving symptoms such as shortness of breath, decreased exercise tolerance, and extremity edema.    Expected Outcomes  Improve functional capacity of life;Short term: Attendance in program 2-3 days a week with increased exercise capacity. Reported lower sodium intake. Reported increased fruit and vegetable intake. Reports medication compliance.;Short term: Daily weights obtained and reported for increase. Utilizing diuretic protocols set by physician.;Long term: Adoption of self-care skills and reduction of barriers for early signs and symptoms recognition and intervention leading to self-care maintenance.    Hypertension  Yes    Intervention  Provide education on lifestyle modifcations including regular physical activity/exercise, weight management, moderate sodium restriction and increased consumption of fresh fruit, vegetables, and low fat dairy, alcohol moderation, and smoking cessation.;Monitor prescription use compliance.    Expected Outcomes  Short Term: Continued assessment and intervention until BP is < 140/23mm HG in  hypertensive participants. < 130/58mm HG in hypertensive participants with diabetes, heart failure or chronic kidney disease.;Long Term: Maintenance of blood pressure at goal levels.    Lipids  Yes    Intervention  Provide education and support for participant on nutrition & aerobic/resistive exercise along with prescribed medications to achieve LDL 70mg , HDL >40mg .    Expected Outcomes  Short Term: Participant states understanding of desired cholesterol values and is compliant with medications prescribed. Participant is following exercise prescription and nutrition guidelines.;Long Term: Cholesterol controlled with medications as prescribed, with individualized exercise RX and with personalized nutrition plan. Value goals: LDL < 70mg , HDL > 40 mg.    Stress  Yes    Intervention  Offer individual and/or small group education and counseling on adjustment to heart disease, stress management and health-related lifestyle change. Teach and support self-help strategies.;Refer participants experiencing significant psychosocial distress to appropriate mental health specialists for further evaluation and treatment. When possible, include family members and significant others in education/counseling sessions.    Expected Outcomes  Short Term: Participant demonstrates changes in health-related  behavior, relaxation and other stress management skills, ability to obtain effective social support, and compliance with psychotropic medications if prescribed.;Long Term: Emotional wellbeing is indicated by absence of clinically significant psychosocial distress or social isolation.       Tobacco Use Initial Evaluation: Social History   Tobacco Use  Smoking Status Former Smoker  . Types: Cigarettes  . Last attempt to quit: 01/05/1978  . Years since quitting: 39.1  Smokeless Tobacco Never Used    Exercise Goals and Review: Exercise Goals    Row Name 11/30/16 1503             Exercise Goals   Increase Physical  Activity  Yes       Intervention  Provide advice, education, support and counseling about physical activity/exercise needs.;Develop an individualized exercise prescription for aerobic and resistive training based on initial evaluation findings, risk stratification, comorbidities and participant's personal goals.       Expected Outcomes  Achievement of increased cardiorespiratory fitness and enhanced flexibility, muscular endurance and strength shown through measurements of functional capacity and personal statement of participant.       Increase Strength and Stamina  Yes       Intervention  Provide advice, education, support and counseling about physical activity/exercise needs.;Develop an individualized exercise prescription for aerobic and resistive training based on initial evaluation findings, risk stratification, comorbidities and participant's personal goals.       Expected Outcomes  Achievement of increased cardiorespiratory fitness and enhanced flexibility, muscular endurance and strength shown through measurements of functional capacity and personal statement of participant.       Able to understand and use rate of perceived exertion (RPE) scale  Yes       Intervention  Provide education and explanation on how to use RPE scale       Expected Outcomes  Short Term: Able to use RPE daily in rehab to express subjective intensity level;Long Term:  Able to use RPE to guide intensity level when exercising independently       Knowledge and understanding of Target Heart Rate Range (THRR)  Yes       Intervention  Provide education and explanation of THRR including how the numbers were predicted and where they are located for reference       Expected Outcomes  Short Term: Able to state/look up THRR;Long Term: Able to use THRR to govern intensity when exercising independently;Short Term: Able to use daily as guideline for intensity in rehab       Able to check pulse independently  Yes       Intervention   Provide education and demonstration on how to check pulse in carotid and radial arteries.;Review the importance of being able to check your own pulse for safety during independent exercise       Expected Outcomes  Short Term: Able to explain why pulse checking is important during independent exercise;Long Term: Able to check pulse independently and accurately       Understanding of Exercise Prescription  Yes       Intervention  Provide education, explanation, and written materials on patient's individual exercise prescription       Expected Outcomes  Short Term: Able to explain program exercise prescription;Long Term: Able to explain home exercise prescription to exercise independently          Copy of goals given to participant.

## 2017-02-22 DIAGNOSIS — J849 Interstitial pulmonary disease, unspecified: Secondary | ICD-10-CM

## 2017-02-22 DIAGNOSIS — Z955 Presence of coronary angioplasty implant and graft: Secondary | ICD-10-CM | POA: Diagnosis not present

## 2017-02-22 NOTE — Progress Notes (Signed)
Pulmonary Individual Treatment Plan  Patient Details  Name: Jacob Avila MRN: [id number] Date of Birth: 1927/06/04 Referring Provider:     Cardiac Rehab from 02/15/2017 in [facility]iac [facility] Re[facility]  Referring Provider  Deon Pilling      Initial Encounter Date:    Cardiac Rehab from 02/15/2017 in [facility] Cardiac [facility] Pulmonary Rehab  Date  02/18/17 [Starting Pulmonary on 2/18]  Referring Provider  Deon Pilling      Visit Diagnosis: ILD (interstitial lung disease) ([place])  Patient's Home Medications on Admission:  Current Outpatient Medications:  .  aspirin EC 81 MG tablet, Take 81 mg by mouth., Disp: , Rfl:  .  atorvastatin (LIPITOR) 80 MG tablet, Take 80 mg by mouth., Disp: , Rfl:  .  clopidogrel (PLAVIX) 75 MG tablet, Take 75 mg by mouth., Disp: , Rfl:  .  furosemide (LASIX) 20 MG tablet, Take 40 mg by mouth., Disp: , Rfl:  .  lisinopril (PRINIVIL,ZESTRIL) 2.5 MG tablet, Take 2.5 mg by mouth., Disp: , Rfl:  .  metoprolol succinate (TOPROL-XL) 50 MG 24 hr tablet, Take 50 mg by mouth., Disp: , Rfl:  .  omeprazole (PRILOSEC) 20 MG capsule, Take 20 mg by mouth., Disp: , Rfl:  .  simvastatin (ZOCOR) 40 MG tablet, Take 40 mg by mouth., Disp: , Rfl:   Past Medical History: Past Medical History:  Diagnosis Date  . 1st degree AV block 05/13/2012  . Arteriosclerosis of coronary artery 11/12/2014  . Atrial fibrillation ([place]) 11/30/2016  . BP (high blood pressure) 05/12/2012  . Bradycardia 05/12/2012  . Dizziness 05/12/2012  . HLD (hyperlipidemia)     Tobacco Use: Social History   Tobacco Use  Smoking Status Former Smoker  . Types: Cigarettes  . Last attempt to quit: 01/05/1978  . Years since quitting: 39.1  Smokeless Tobacco Never Used    Labs: Recent Review Flowsheet Data    There is no flowsheet data to display.       Pulmonary Assessment Scores: Pulmonary Assessment Scores    Row Name 02/15/17 1514 02/22/17 1147       ADL UCSD   ADL Phase  Entry  Entry    SOB  Score total  80  -    Rest  2  -    Walk  3  -    Stairs  5  -    Bath  2  -    Dress  3  -    Shop  4  -      CAT Score   CAT Score  28  -      mMRC Score   mMRC Score  -  1       Pulmonary Function Assessment:   Exercise Target Goals:    Exercise Program Goal: Individual exercise prescription set using results from initial 6 min walk test and THRR while considering  patient's activity barriers and safety.    Exercise Prescription Goal: Initial exercise prescription builds to 30-45 minutes a day of aerobic activity, 2-3 days per week.  Home exercise guidelines will be given to patient during program as part of exercise prescription that the participant will acknowledge.  Activity Barriers & Risk Stratification: Activity Barriers & Cardiac Risk Stratification - 11/30/16 1413      Activity Barriers & Cardiac Risk Stratification   Activity Barriers  Shortness of Breath;Deconditioning;Muscular Weakness;Balance Concerns    Cardiac Risk Stratification  Moderate       6 Minute Walk: 6  Minute Walk    Row Name 11/30/16 1452 02/03/17 0905 02/05/17 0923     6 Minute Walk   Phase  Initial  Discharge  Discharge   Distance  1265 feet  1160 feet now wearing O2 continuously  1530 feet   Distance % Change  -  -8.3 %  21 %   Distance Feet Change  -  -105 ft  265 ft   Walk Time  6 minutes  6 minutes  6 minutes   # of Rest Breaks  0  0  0   MPH  2.4  2.2  2.89   METS  2.74  2.42  3.43   RPE  13  15  15    Perceived Dyspnea   1  1  -   VO2 Peak  9.6  8.46  12   Symptoms  Yes (comment)  Yes (comment)  -   Comments  SOB  SOB  -   Resting HR  109 bpm  94 bpm  -   Resting BP  116/64  106/60  -   Resting Oxygen Saturation   99 %  95 %  -   Exercise Oxygen Saturation  during 6 min walk  97 %  92 %  -   Max Ex. HR  152 bpm  150 bpm  172 bpm   Max Ex. BP  160/74  146/70  152/84   2 Minute Post BP  124/64  -  -     Interval HR   1 Minute HR  -  126  -   2 Minute HR  -  137  -   3  Minute HR  -  130  -   4 Minute HR  -  148  -   5 Minute HR  -  143  -   6 Minute HR  -  146  -   Interval Heart Rate?  -  Yes  -     Interval Oxygen   Interval Oxygen?  -  Yes  -   Baseline Oxygen Saturation %  -  95 %  -   1 Minute Oxygen Saturation %  -  96 %  -   1 Minute Liters of Oxygen  -  2 L  -   2 Minute Oxygen Saturation %  -  96 %  -   2 Minute Liters of Oxygen  -  2 L  -   3 Minute Oxygen Saturation %  -  93 %  -   3 Minute Liters of Oxygen  -  2 L  -   4 Minute Oxygen Saturation %  -  97 %  -   4 Minute Liters of Oxygen  -  2 L  -   5 Minute Oxygen Saturation %  -  92 %  -   5 Minute Liters of Oxygen  -  2 L  -   6 Minute Oxygen Saturation %  -  94 %  -   6 Minute Liters of Oxygen  -  2 L  -   Row Name 02/18/17 1202         6 Minute Walk   Phase  Initial using cardiac post 6MWT     Distance  1530 feet     Walk Time  6 minutes     # of Rest Breaks  0     MPH  2.89  METS  3.43     RPE  15     Perceived Dyspnea   1     VO2 Peak  12     Symptoms  Yes (comment)     Comments  SOB     Resting HR  94 bpm     Resting Oxygen Saturation   95 %     Exercise Oxygen Saturation  during 6 min walk  92 %     Max Ex. HR  172 bpm     Max Ex. BP  152/84       Oxygen Initial Assessment: Oxygen Initial Assessment - 02/01/17 0858      Home Oxygen   Home Oxygen Device  Home Concentrator;E-Tanks    Sleep Oxygen Prescription  Continuous    Liters per minute  2    Home Exercise Oxygen Prescription  Continuous    Liters per minute  2    Home at Rest Exercise Oxygen Prescription  Continuous    Liters per minute  2    Compliance with Home Oxygen Use  Yes      Program Oxygen Prescription   Program Oxygen Prescription  Continuous;E-Tanks    Liters per minute  2      Intervention   Short Term Goals  To learn and exhibit compliance with exercise, home and travel O2 prescription;To learn and understand importance of maintaining oxygen saturations>88%;To learn and  understand importance of monitoring SPO2 with pulse oximeter and demonstrate accurate use of the pulse oximeter.;To learn and demonstrate proper pursed lip breathing techniques or other breathing techniques.    Long  Term Goals  Maintenance of O2 saturations>88%;Exhibits compliance with exercise, home and travel O2 prescription;Verbalizes importance of monitoring SPO2 with pulse oximeter and return demonstration;Exhibits proper breathing techniques, such as pursed lip breathing or other method taught during program session       Oxygen Re-Evaluation: Oxygen Re-Evaluation    Row Name 02/01/17 0859 02/22/17 1014           Program Oxygen Prescription   Program Oxygen Prescription  Continuous;E-Tanks  Continuous;E-Tanks      Liters per minute  2  2        Home Oxygen   Home Oxygen Device  Home Concentrator;E-Tanks  Home Concentrator;E-Tanks      Sleep Oxygen Prescription  Continuous  Continuous      Liters per minute  2  -      Home Exercise Oxygen Prescription  Continuous  Continuous      Liters per minute  2  2      Home at Rest Exercise Oxygen Prescription  Continuous  Continuous      Liters per minute  2  2      Compliance with Home Oxygen Use  Yes  Yes        Goals/Expected Outcomes   Short Term Goals  To learn and exhibit compliance with exercise, home and travel O2 prescription;To learn and understand importance of maintaining oxygen saturations>88%;To learn and understand importance of monitoring SPO2 with pulse oximeter and demonstrate accurate use of the pulse oximeter.;To learn and demonstrate proper pursed lip breathing techniques or other breathing techniques.  To learn and exhibit compliance with exercise, home and travel O2 prescription;To learn and understand importance of maintaining oxygen saturations>88%;To learn and understand importance of monitoring SPO2 with pulse oximeter and demonstrate accurate use of the pulse oximeter.;To learn and demonstrate proper pursed lip  breathing techniques or other breathing techniques.  Long  Term Goals  Maintenance of O2 saturations>88%;Exhibits compliance with exercise, home and travel O2 prescription;Verbalizes importance of monitoring SPO2 with pulse oximeter and return demonstration;Exhibits proper breathing techniques, such as pursed lip breathing or other method taught during program session  Maintenance of O2 saturations>88%;Exhibits compliance with exercise, home and travel O2 prescription;Verbalizes importance of monitoring SPO2 with pulse oximeter and return demonstration;Exhibits proper breathing techniques, such as pursed lip breathing or other method taught during program session      Comments  Reviewed PLB technique with pt.  Talked about how it work and it's important to maintaining his exercise saturations.    Reviewed PLB technique with pt.  Talked about how it work and it's important to maintaining his exercise saturations.        Goals/Expected Outcomes  Short: Become more profiecient at using PLB.   Long: Become independent at using PLB.  Short: Become more profiecient at using PLB.   Long: Become independent at using PLB.         Oxygen Discharge (Final Oxygen Re-Evaluation): Oxygen Re-Evaluation - 02/22/17 1014      Program Oxygen Prescription   Program Oxygen Prescription  Continuous;E-Tanks    Liters per minute  2      Home Oxygen   Home Oxygen Device  Home Concentrator;E-Tanks    Sleep Oxygen Prescription  Continuous    Home Exercise Oxygen Prescription  Continuous    Liters per minute  2    Home at Rest Exercise Oxygen Prescription  Continuous    Liters per minute  2    Compliance with Home Oxygen Use  Yes      Goals/Expected Outcomes   Short Term Goals  To learn and exhibit compliance with exercise, home and travel O2 prescription;To learn and understand importance of maintaining oxygen saturations>88%;To learn and understand importance of monitoring SPO2 with pulse oximeter and  demonstrate accurate use of the pulse oximeter.;To learn and demonstrate proper pursed lip breathing techniques or other breathing techniques.    Long  Term Goals  Maintenance of O2 saturations>88%;Exhibits compliance with exercise, home and travel O2 prescription;Verbalizes importance of monitoring SPO2 with pulse oximeter and return demonstration;Exhibits proper breathing techniques, such as pursed lip breathing or other method taught during program session    Comments  Reviewed PLB technique with pt.  Talked about how it work and it's important to maintaining his exercise saturations.      Goals/Expected Outcomes  Short: Become more profiecient at using PLB.   Long: Become independent at using PLB.       Initial Exercise Prescription: Initial Exercise Prescription - 02/18/17 1200      Date of Initial Exercise RX and Referring Provider   Date  02/18/17 Starting Pulmonary on 2/18    Referring Provider  Deon Pilling      Oxygen   Oxygen  Continuous    Liters  2      Recumbant Bike   Level  4    RPM  30    Watts  20    Minutes  15    METs  2.79      NuStep   Level  6    SPM  80    Minutes  15    METs  3.5      Track   Laps  30    Minutes  15    METs  2.38      Prescription Details   Frequency (times per week)  3  Duration  Progress to 45 minutes of aerobic exercise without signs/symptoms of physical distress      Intensity   THRR 40-80% of Max Heartrate  110-125    Ratings of Perceived Exertion  11-13    Perceived Dyspnea  0-4      Progression   Progression  Continue to progress workloads to maintain intensity without signs/symptoms of physical distress.      Resistance Training   Training Prescription  Yes    Weight  4 lbs    Reps  10-15       Perform Capillary Blood Glucose checks as needed.  Exercise Prescription Changes:  Exercise Prescription Changes    Row Name 11/30/16 1400 12/15/16 1300 12/16/16 0800 12/30/16 1500 01/13/17 1500     Response to  Exercise   Blood Pressure (Admit)  116/64  128/64  -  126/54  124/82   Blood Pressure (Exercise)  124/64  128/64  -  128/72  116/74   Blood Pressure (Exit)  112/64  124/70  -  126/74  128/70   Heart Rate (Admit)  109 bpm  104 bpm  -  98 bpm  111 bpm   Heart Rate (Exercise)  152 bpm  160 bpm  -  147 bpm high heart rate during resistance training  100 bpm   Heart Rate (Exit)  106 bpm  109 bpm  -  105 bpm  118 bpm   Oxygen Saturation (Admit)  99 %  -  -  -  -   Oxygen Saturation (Exercise)  97 %  -  -  -  -   Rating of Perceived Exertion (Exercise)  13  15  -  11  11   Perceived Dyspnea (Exercise)  1  -  -  -  -   Symptoms  SOB  none  -  none  none   Comments  walk test resutls  -  -  -  -   Duration  -  Continue with 45 min of aerobic exercise without signs/symptoms of physical distress.  -  Continue with 45 min of aerobic exercise without signs/symptoms of physical distress.  Continue with 45 min of aerobic exercise without signs/symptoms of physical distress.   Intensity  -  THRR unchanged  -  THRR unchanged  THRR unchanged     Progression   Progression  -  Continue to progress workloads to maintain intensity without signs/symptoms of physical distress.  -  Continue to progress workloads to maintain intensity without signs/symptoms of physical distress.  Continue to progress workloads to maintain intensity without signs/symptoms of physical distress.   Average METs  -  2.62  -  3.01  3.27     Resistance Training   Training Prescription  -  Yes  -  Yes  Yes   Weight  -  3 lbs  -  4 lbs  4 lbs   Reps  -  10-15  -  10-15  10-15     Interval Training   Interval Training  -  No  -  No  No     Recumbant Bike   Level  -  3  -  4  4   Watts  -  18  -  25  18   Minutes  -  15  -  15  15   METs  -  2.79  -  2.79  2.79     NuStep   Level  -  4  -  5  6   Minutes  -  15  -  15  15   METs  -  2.1  -  3.4  3.9     Track   Laps  -  46  -  40  46   Minutes  -  15  -  15  15   METs  -  3.14   -  2.84  3.14     Home Exercise Plan   Plans to continue exercise at  -  -  Home (comment) walking  Home (comment) walking  Home (comment) walking   Frequency  -  -  Add 2 additional days to program exercise sessions.  Add 2 additional days to program exercise sessions.  Add 2 additional days to program exercise sessions.   Initial Home Exercises Provided  -  -  12/16/16  12/16/16  12/16/16   Row Name 01/25/17 1500 02/10/17 1400           Response to Exercise   Blood Pressure (Admit)  114/60  112/72      Blood Pressure (Exercise)  112/60  122/62      Blood Pressure (Exit)  118/60  132/64      Heart Rate (Admit)  124 bpm  125 bpm      Heart Rate (Exercise)  134 bpm  150 bpm      Heart Rate (Exit)  112 bpm  102 bpm      Rating of Perceived Exertion (Exercise)  13  12      Symptoms  none  none      Duration  Continue with 45 min of aerobic exercise without signs/symptoms of physical distress.  Continue with 45 min of aerobic exercise without signs/symptoms of physical distress.      Intensity  THRR unchanged  THRR unchanged        Progression   Progression  Continue to progress workloads to maintain intensity without signs/symptoms of physical distress.  Continue to progress workloads to maintain intensity without signs/symptoms of physical distress.      Average METs  2.63  2.96        Resistance Training   Training Prescription  Yes  Yes      Weight  4 lbs  4 lbs      Reps  10-15  10-15        Interval Training   Interval Training  No  No        Recumbant Bike   Level  4  4      Watts  19  22      Minutes  15  15      METs  2.79  2.79        NuStep   Level  6  6      Minutes  15  15      METs  2.9  3.7        Track   Laps  30  30      Minutes  15  15      METs  2.38  2.38        Home Exercise Plan   Plans to continue exercise at  Home (comment) walking  Home (comment) walking      Frequency  Add 2 additional days to program exercise sessions.  Add 2 additional days  to program exercise sessions.      Initial Home Exercises  Provided  12/16/16  12/16/16         Exercise Comments:  Exercise Comments    Row Name 12/07/16 0804 01/25/17 0934 02/15/17 0808 02/22/17 1013     Exercise Comments  First full day of exercise!  Patient was oriented to gym and equipment including functions, settings, policies, and procedures.  Patient's individual exercise prescription and treatment plan were reviewed.  All starting workloads were established based on the results of the 6 minute walk test done at initial orientation visit.  The plan for exercise progression was also introduced and progression will be customized based on patient's performance and goals.  Jacob Avila mentioned that he was not feeling well today.  He couldn't really pinpoint anything in particular, just not feeling well.  He sat through class without at problem.  Jacob Avila was able to exercise today without a problem, but he just stayed on the NuStep today for the whole time.   Averey graduated today from cardiac rehab with 36 sessions completed.  Details of the patient's exercise prescription and what He needs to do in order to continue the prescription and progress were discussed with patient.  Patient was given a copy of prescription and goals.  Patient verbalized understanding.  [place] plans to continue to exercise by coming into Pulmonary Rehab.  First full day of exercise!  Patient was oriented to gym and equipment including functions, settings, policies, and procedures.  Patient's individual exercise prescription and treatment plan were reviewed.  All starting workloads were established based on the results of the 6 minute walk test done at initial orientation visit.  The plan for exercise progression was also introduced and progression will be customized based on patient's performance and goals.       Exercise Goals and Review:  Exercise Goals    Row Name 11/30/16 1503             Exercise Goals   Increase  Physical Activity  Yes       Intervention  Provide advice, education, support and counseling about physical activity/exercise needs.;Develop an individualized exercise prescription for aerobic and resistive training based on initial evaluation findings, risk stratification, comorbidities and participant's personal goals.       Expected Outcomes  Achievement of increased cardiorespiratory fitness and enhanced flexibility, muscular endurance and strength shown through measurements of functional capacity and personal statement of participant.       Increase Strength and Stamina  Yes       Intervention  Provide advice, education, support and counseling about physical activity/exercise needs.;Develop an individualized exercise prescription for aerobic and resistive training based on initial evaluation findings, risk stratification, comorbidities and participant's personal goals.       Expected Outcomes  Achievement of increased cardiorespiratory fitness and enhanced flexibility, muscular endurance and strength shown through measurements of functional capacity and personal statement of participant.       Able to understand and use rate of perceived exertion (RPE) scale  Yes       Intervention  Provide education and explanation on how to use RPE scale       Expected Outcomes  Short Term: Able to use RPE daily in rehab to express subjective intensity level;Long Term:  Able to use RPE to guide intensity level when exercising independently       Knowledge and understanding of Target Heart Rate Range (THRR)  Yes       Intervention  Provide education and explanation of THRR including how the numbers were predicted  and where they are located for reference       Expected Outcomes  Short Term: Able to state/look up THRR;Long Term: Able to use THRR to govern intensity when exercising independently;Short Term: Able to use daily as guideline for intensity in rehab       Able to check pulse independently  Yes        Intervention  Provide education and demonstration on how to check pulse in carotid and radial arteries.;Review the importance of being able to check your own pulse for safety during independent exercise       Expected Outcomes  Short Term: Able to explain why pulse checking is important during independent exercise;Long Term: Able to check pulse independently and accurately       Understanding of Exercise Prescription  Yes       Intervention  Provide education, explanation, and written materials on patient's individual exercise prescription       Expected Outcomes  Short Term: Able to explain program exercise prescription;Long Term: Able to explain home exercise prescription to exercise independently          Exercise Goals Re-Evaluation : Exercise Goals Re-Evaluation    Row Name 12/07/16 0805 12/15/16 1309 12/16/16 0816 12/30/16 1512 01/13/17 1536     Exercise Goal Re-Evaluation   Exercise Goals Review  Knowledge and understanding of Target Heart Rate Range (THRR);Able to understand and use rate of perceived exertion (RPE) scale;Understanding of Exercise Prescription  Increase Physical Activity;Increase Strength and Stamina;Understanding of Exercise Prescription  -  Increase Physical Activity;Increase Strength and Stamina;Understanding of Exercise Prescription  Increase Physical Activity;Increase Strength and Stamina;Understanding of Exercise Prescription   Comments  Reviewed RPE scale, THR and program prescription with pt today.  Pt voiced understanding and was given a copy of goals to take home.   Jacob Avila is off to a good start in rehab.  He is already up to 18 watts on the recumbent bike.  We will continue to monitor his progress.   Reviewed home exercise with pt today.  Pt plans to walk at home for exercise.   He will be walking on his off days from exercise.  Reviewed THR, pulse, RPE, sign and symptoms, NTG use, and when to call 911 or MD.  Also discussed weather considerations and indoor options.  Pt  voiced understanding.  Jacob Avila has been doing well in rehab.  He has not been walking at much as we talked about but he goes on occasion.  He is up to 25 watts on the recumbent bike and level 5 on the NuStep.  We will continue to monitor his progression.   Jacob Avila continues to do well in rehab.  He was out today for a [place] appointment.  He continues to not walk at home.  He is up to level 6 now on the NuStep.  We will continue to encourage him to walk at home and to monitor his progression.    Expected Outcomes  Short: Use RPE daily to regulate intensity.  Long: Follow program prescription in THR.  Short: Continue to attend regularly and review home exercise.  Long: Continue to follow program prescription.   Short: Add in walking daily as part of routine to build strength and stamina. Long: Make exercise part of routine.   Short: Continue to try to exercise more at home on off days.  Long: Continue to increase physical activity levels.   Short: Continue to ty increase watts on recumbent bike.  Long: Continue to walk  more at home.    [place] Name 01/22/17 0800 01/25/17 1542 02/10/17 1443 02/22/17 1013       Exercise Goal Re-Evaluation   Exercise Goals Review  Increase Physical Activity;Increase Strength and Stamina;Understanding of Exercise Prescription  Increase Physical Activity;Increase Strength and Stamina;Understanding of Exercise Prescription  Increase Physical Activity;Increase Strength and Stamina;Understanding of Exercise Prescription  Understanding of Exercise Prescription;Knowledge and understanding of Target Heart Rate Range (THRR);Able to understand and use Dyspnea scale;Able to understand and use rate of perceived exertion (RPE) scale    Comments  Jacob Avila has been doing well in rehab.  He has only been doing some of his exercise at home.  He stays active but not actually taking the time to go for his walk.  He said that it would help if it had more incentive to do his walking.  He says he lacks a appetitie, maybe  more exercise can help.  He is open to trying it out.   Jacob Avila has continued to do well in rehab.  Today, he did not feel as good so he stayed on the NuStep the whole time.  He has worked upto 19 watts on the recumbent bike.  We will continue to monitor his progression.   Jacob Avila will be graduating on Friday!!  He plans to continue to exercise by coming to Pulmonary Rehab.   Reviewed RPE scale, THR and program prescription with pt today.  Pt voiced understanding and was given a copy of goals to take home.     Expected Outcomes  Short: Walk more at home to help with appetite.  Long: Continue to work on IT sales professional.  Short: Continue to try to increase number of laps walking back to where he was before.  Long: Continue to exercise more to work on strength and stamina.   Short: Graduation.  Long: Enroll in pulmonary rehab to continue to exercise.   Short: Use RPE daily to regulate intensity.  Long: Follow program prescription in [facility].       Discharge Exercise Prescription (Final Exercise Prescription Changes): Exercise Prescription Changes - 02/10/17 1400      Response to Exercise   Blood Pressure (Admit)  112/72    Blood Pressure (Exercise)  122/62    Blood Pressure (Exit)  132/64    Heart Rate (Admit)  125 bpm    Heart Rate (Exercise)  150 bpm    Heart Rate (Exit)  102 bpm    Rating of Perceived Exertion (Exercise)  12    Symptoms  none    Duration  Continue with 45 min of aerobic exercise without signs/symptoms of physical distress.    Intensity  THRR unchanged      Progression   Progression  Continue to progress workloads to maintain intensity without signs/symptoms of physical distress.    Average METs  2.96      Resistance Training   Training Prescription  Yes    Weight  4 lbs    Reps  10-15      Interval Training   Interval Training  No      Recumbant Bike   Level  4    Watts  22    Minutes  15    METs  2.79      NuStep   Level  6    Minutes  15    METs  3.7      Track    Laps  30    Minutes  15  METs  2.38      Home Exercise Plan   Plans to continue exercise at  Home (comment) walking    Frequency  Add 2 additional days to program exercise sessions.    Initial Home Exercises Provided  12/16/16       Nutrition:  Target Goals: Understanding of nutrition guidelines, daily intake of sodium <1539m, cholesterol <2064m calories 30% from fat and 7% or less from saturated fats, daily to have 5 or more servings of fruits and vegetables.  Biometrics: Pre Biometrics - 11/30/16 1504      Pre Biometrics   Height  6' 0.5" (1.842 m)    Weight  221 lb (100.2 kg)    Waist Circumference  39 inches    Hip Circumference  43 inches    Waist to Hip Ratio  0.91 %    BMI (Calculated)  29.54    Single Leg Stand  1.65 seconds      Post Biometrics - 02/03/17 099163     Post  Biometrics   Height  6' 0.5" (1.842 m)    Weight  211 lb (95.7 kg)    Waist Circumference  38 inches    Hip Circumference  43 inches    Waist to Hip Ratio  0.88 %    BMI (Calculated)  28.21    Single Leg Stand  1.66 seconds       Nutrition Therapy Plan and Nutrition Goals: Nutrition Therapy & Goals - 02/03/17 0922      Nutrition Therapy   Diet  TLC    Drug/Food Interactions  Statins/Certain Fruits    Protein (specify units)  9oz    Fiber  36 grams    Saturated Fats  16 max. grams    Fruits and Vegetables  6 servings/day    Sodium  2000 grams      Personal Nutrition Goals   Nutrition Goal  Choose foods with strong flavors "twang" and aromas to help stimulate appetite    Personal Goal #2  Continue to drink Ensure supplement drinks on a regular basis until appetite improves    Personal Goal #3  Eat a source of protein at each meal and snack      Intervention Plan   Intervention  Prescribe, educate and counsel regarding individualized specific dietary modifications aiming towards targeted core components such as weight, hypertension, lipid management, diabetes, heart failure and  other comorbidities.    Expected Outcomes  Short Term Goal: Understand basic principles of dietary content, such as calories, fat, sodium, cholesterol and nutrients.;Short Term Goal: A plan has been developed with personal nutrition goals set during dietitian appointment.;Long Term Goal: Adherence to prescribed nutrition plan.       Nutrition Assessments: Nutrition Assessments - 02/15/17 1513      MEDFICTS Scores   Pre Score  6    Post Score  30    Score Difference  24       Nutrition Goals Re-Evaluation: Nutrition Goals Re-Evaluation    Row Name 12/16/16 0804 01/22/17 [id number]         Goals   Current Weight  -  219 lb (99.3 kg)      Nutrition Goal  Meet with LiLattie Hawbout nutrtition  Meet with LiLattie Hawo talk about appetitie loss and how to kick it up a notch.  Also would like to continue to lose weight.       Comment  RaDwyane Luooes not want to set up a  separate appointment for nutrition.  He is open to meeting during class for some tips to his diet and hearing her suggestions.   Jacob Avila missed his appointmet with nutrition.  We will reschedule to happen during class on Jan 30.      Expected Outcome  Short: Meet with Lattie Haw.  Long: Apply some tips to his diet and maintain heart healthy diet.   Short: Meet with Lattie Haw.  Long: Apply some tips to his diet and maintain heart healthy diet.          Nutrition Goals Discharge (Final Nutrition Goals Re-Evaluation): Nutrition Goals Re-Evaluation - 01/22/17 0808      Goals   Current Weight  219 lb (99.3 kg)    Nutrition Goal  Meet with Lattie Haw to talk about appetitie loss and how to kick it up a notch.  Also would like to continue to lose weight.     Comment  Jacob Avila missed his appointmet with nutrition.  We will reschedule to happen during class on Jan 30.    Expected Outcome  Short: Meet with Lattie Haw.  Long: Apply some tips to his diet and maintain heart healthy diet.        Psychosocial: Target Goals: Acknowledge presence or absence of significant depression  and/or stress, maximize coping skills, provide positive support system. Participant is able to verbalize types and ability to use techniques and skills needed for reducing stress and depression.   Initial Review & Psychosocial Screening: Initial Psych Review & Screening - 11/30/16 1410      Initial Review   Current issues with  Current Sleep Concerns      Family Dynamics   Good Support System?  Yes    Comments  Wife and daughter      Barriers   Psychosocial barriers to participate in program  The patient should benefit from training in stress management and relaxation.      Screening Interventions   Interventions  Yes;Encouraged to exercise;Provide feedback about the scores to participant;Program counselor consult;To provide support and resources with identified psychosocial needs    Expected Outcomes  Short Term goal: Utilizing psychosocial counselor, staff and physician to assist with identification of specific Stressors or current issues interfering with healing process. Setting desired goal for each stressor or current issue identified.;Long Term Goal: Stressors or current issues are controlled or eliminated.;Short Term goal: Identification and review with participant of any Quality of Life or Depression concerns found by scoring the questionnaire.;Long Term goal: The participant improves quality of Life and PHQ9 Scores as seen by post scores and/or verbalization of changes       Quality of Life Scores: Quality of Life - 02/15/17 1511      Quality of Life Scores   Health/Function Pre  27.17 %    Health/Function Post  24.4 %    Health/Function % Change  -10.2 %    Socioeconomic Pre  30 %    Socioeconomic Post  27.75 %    Socioeconomic % Change   -7.5 %    Psych/Spiritual Pre  30 %    Psych/Spiritual Post  30 %    Psych/Spiritual % Change  0 %    Family Pre  30 %    Family Post  30 %    Family % Change  0 %    GLOBAL Pre  28.78 %    GLOBAL Post  27.09 %    GLOBAL % Change   -5.87 %      Scores  of 19 and below usually indicate a poorer quality of life in these areas.  A difference of  2-3 points is a clinically meaningful difference.  A difference of 2-3 points in the total score of the Quality of Life Index has been associated with significant improvement in overall quality of life, self-image, physical symptoms, and general health in studies assessing change in quality of life.  PHQ-9: Recent Review Flowsheet Data    Depression screen [facility] 2/9 02/15/2017 02/15/2017 01/25/2017 12/07/2016 11/30/2016   Decreased Interest 2 - 2 2 2    Down, Depressed, Hopeless 2 - 1 1 2    PHQ - 2 Score 4 - 3 3 4    Altered sleeping 1 1 1  0 2   Tired, decreased energy 3 - 2 2 3    Change in appetite 0 - 3 3 3    Feeling bad or failure about yourself  0 - 1 0 0   Trouble concentrating 0 - 0 1 2   Moving slowly or fidgety/restless 0 - 0 1 1   Suicidal thoughts 0 - 1 0 0   PHQ-9 Score 8 - 11 10 15    Difficult doing work/chores - - Somewhat difficult Somewhat difficult Somewhat difficult     Interpretation of Total Score  Total Score Depression Severity:  1-4 = Minimal depression, 5-9 = Mild depression, 10-14 = Moderate depression, 15-19 = Moderately severe depression, 20-27 = Severe depression   Psychosocial Evaluation and Intervention: Psychosocial Evaluation - 02/15/17 0926      Discharge Psychosocial Assessment & Intervention   Comments  Counselor met with Mr. Spainhour Jacob Avila) for discharge evaluation.  He reports finishing this program today and beginning with Pulmonary Rehab next Monday.  He plans to walk several times this week to maintain his progress.  Jacob Avila states he is feeling stronger; breathing better; and having more energy overall.  This reportedly impacts his mood as he reports feeling less depressed.  His PHQ-9 dropped several points from "11" to an "8" from moderate down to mild depressive symptoms.  He denies thoughts of self-harm and recognizes he has "much to live for" at  the age of 55.  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  He also has a tractor and when the weather breaks to get out and begin some gardening.  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  Counselor commended Jacob Avila on all his progress and hard work.  He is committed to consistent exercise and this has been very positive for him.  Counselor will follow him in Pulmonary Rehab as well.         Psychosocial Re-Evaluation: Psychosocial Re-Evaluation    Row Name 12/16/16 0805 01/22/17 2957 01/25/17 0916 02/03/17 1002       Psychosocial Re-Evaluation   Current issues with  Current Stress Concerns  Current Stress Concerns  Current Stress Concerns  -    Comments  Jacob Avila has been doing well in rehab. He says class has been hard as he is not used to working this hard. 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  He enjoys working and finds himself to be more content while driving.  His energy is getting better and he feels ready to back to work.Marland Kitchen  He continues to sleep well.   Jacob Avila has continued to do well in  rehab.  He still would like to go back to work, but he is waiting for his doctor to clear him to return.  He spoke with them yesterday and is awaiting a response.  Overall, he is feeling better but he does have days where he doesn't feel 100% still.  He is postivie and has a strong support system.  He has also been sleeping better.  Biggest benefit of rehab has been getting stronger as he was feeling weak.  Also if he could eat better that would help to improve his strength too.   Counselor follow up with Jacob Avila today reporting continues to have appetite problems which are impacting his energy level and mood to some degree.  He states this has been an ongoing concern since his recent cardiac issue wherein he has to "force food."  He is losing a little weight but nothing  significant.  Jacob Avila has an appointment to meet with the Dietician later this month re: this.  counselor reviewed Rae's PHQ-9 depression inventory which increased "1" point from a "10" initially to an "11," indicating Jacob Avila may be moderately depressed.  He also stated he has "thoughts" of self harm sometimes and counselor assessed this with Jacob Avila reporting no plan - but because he feels so badly at times with lack of energy and appetite and some sleep issues, these thoughts do occur on occasion.  Jacob Avila also reports he has tried to reach his Dr. to discuss this but is having difficulty connecting.  Counselor and staff will follow with Jacob Avila through the dietician's meeting and recommendations.  Counselor also recommended that Jacob Avila continue to try to reach his Dr. to report these concerns.  He plans to consult with his spouse and his daughter for concensus on how to move forward on these suggestions.    Counselor met with Jacob Avila today stating his appetite and energy this week are "a little better" as he is working out more consistently.  He was scheduled to meet with the dietician today and staff will follow with him after that occurs.      Expected Outcomes  Short: Talk to doctor about getting back to work.  Long: Continue to enjoy life and maintain positive attitude.   Short: Hear back about going back to work.  Long: Continue to maintain his positive attitude.   Jacob Avila will meet with the dietician about his appetite issues and will continue to try to reach his Cardiologist as well.  Jacob Avila will continue to exercise to increase his stamina and energy levels.  Staff will continue to follow with Jacob Avila.  Jacob Avila will meet with the dietician today to help with his appetite concerns.  He will also continue to exercise consistently to achieve his stated goals.      Interventions  Encouraged to attend Cardiac Rehabilitation for the exercise;Stress management education  -  Stress management education  -    Continue Psychosocial Services   Follow up  required by staff  -  Follow up required by staff  Follow up required by staff    Comments  -  Jacob Avila still would like to go back to work if they let him.  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   -  -      Initial Review   Source of Stress Concerns  -  Occupation  -  -       Psychosocial Discharge (Final Psychosocial Re-Evaluation): Psychosocial Re-Evaluation - 02/03/17 1002  Psychosocial Re-Evaluation   Comments  Counselor met with Jacob Avila today stating his appetite and energy this week are "a little better" as he is working out more consistently.  He was scheduled to meet with the dietician today and staff will follow with him after that occurs.      Expected Outcomes  Jacob Avila will meet with the dietician today to help with his appetite concerns.  He will also continue to exercise consistently to achieve his stated goals.      Continue Psychosocial Services   Follow up required by staff       Education: Education Goals: Education classes will be provided on a weekly basis, covering required topics. Participant will state understanding/return demonstration of topics presented.  Learning Barriers/Preferences: Learning Barriers/Preferences - 11/30/16 1415      Learning Barriers/Preferences   Learning Barriers  Hearing    Learning Preferences  Verbal Instruction;Skilled Demonstration       Education Topics:  Initial Evaluation Education: - Verbal, written and demonstration of respiratory meds, oximetry and breathing techniques. Instruction on use of nebulizers and MDIs and importance of monitoring MDI activations.   General Nutrition Guidelines/Fats and Fiber: -Group instruction provided by verbal, written material, models and posters to present the general guidelines for heart healthy nutrition. Gives an explanation and review of dietary fats and fiber.   Cardiac Rehab from 02/15/2017 in [facility] Cardiac and Pulmonary Rehab  Date  02/01/17  Educator  CR  Instruction Review Code   1- Verbalizes Understanding      Controlling Sodium/Reading Food Labels: -Group verbal and written material supporting the discussion of sodium use in heart healthy nutrition. Review and explanation with models, verbal and written materials for utilization of the food label.   Cardiac Rehab from 02/15/2017 in [facility] Cardiac and Pulmonary Rehab  Date  02/08/17  Educator  PI  Instruction Review Code  1- Verbalizes Understanding      Exercise Physiology & General Exercise Guidelines: - Group verbal and written instruction with models to review the exercise physiology of the cardiovascular system and associated critical values. Provides general exercise guidelines with specific guidelines to those with heart or lung disease.    Cardiac Rehab from 02/15/2017 in [facility] Cardiac and Pulmonary Rehab  Date  12/30/16  Educator  [facility]  Instruction Review Code  1- Verbalizes Understanding      Aerobic Exercise & Resistance Training: - Gives group verbal and written instruction on the various components of exercise. Focuses on aerobic and resistive training programs and the benefits of this training and how to safely progress through these programs.   Cardiac Rehab from 02/15/2017 in [facility] Cardiac and Pulmonary Rehab  Date  01/06/17  Educator  [facility]  Instruction Review Code  1- Verbalizes Understanding      Flexibility, Balance, Mind/Body Relaxation: Provides group verbal/written instruction on the benefits of flexibility and balance training, including mind/body exercise modes such as yoga, pilates and tai chi.  Demonstration and skill practice provided.   Cardiac Rehab from 02/15/2017 in [facility] Cardiac and Pulmonary Rehab  Date  01/11/17  Educator  [facility]  Instruction Review Code  1- Verbalizes Understanding      Stress and Anxiety: - Provides group verbal and written instruction about the health risks of elevated stress and causes of high stress.  Discuss the correlation between heart/lung disease and  anxiety and treatment options. Review healthy ways to manage with stress and anxiety.   Depression: - Provides group verbal and written instruction on the correlation  between heart/lung disease and depressed mood, treatment options, and the stigmas associated with seeking treatment.   Cardiac Rehab from 02/15/2017 in [facility] Cardiac and Pulmonary Rehab  Date  12/16/16  Educator  [facility]  Instruction Review Code  1- Verbalizes Understanding      Exercise & Equipment Safety: - Individual verbal instruction and demonstration of equipment use and safety with use of the equipment.   Cardiac Rehab from 02/15/2017 in [facility] Cardiac and Pulmonary Rehab  Date  11/30/16  Educator  [place]  Instruction Review Code  1- Verbalizes Understanding      Infection Prevention: - Provides verbal and written material to individual with discussion of infection control including proper hand washing and proper equipment cleaning during exercise session.   Cardiac Rehab from 02/15/2017 in [facility] Cardiac and Pulmonary Rehab  Date  11/30/16  Educator  [place]  Instruction Review Code  1- Verbalizes Understanding      Falls Prevention: - Provides verbal and written material to individual with discussion of falls prevention and safety.   Cardiac Rehab from 02/15/2017 in [facility] Cardiac and Pulmonary Rehab  Date  11/30/16  Educator  [place]  Instruction Review Code  1- Verbalizes Understanding      Diabetes: - Individual verbal and written instruction to review signs/symptoms of diabetes, desired ranges of glucose level fasting, after meals and with exercise. Advice that pre and post exercise glucose checks will be done for 3 sessions at entry of program.   Chronic Lung Diseases: - Group verbal and written instruction to review updates, respiratory medications, advancements in procedures and treatments. Discuss use of supplemental oxygen including available portable oxygen systems, continuous and intermittent flow rates, concentrators,  personal use and safety guidelines. Review proper use of inhaler and spacers. Provide informative websites for self-education.    Energy Conservation: - Provide group verbal and written instruction for methods to conserve energy, plan and organize activities. Instruct on pacing techniques, use of adaptive equipment and posture/positioning to relieve shortness of breath.   Triggers and Exacerbations: - Group verbal and written instruction to review types of environmental triggers and ways to prevent exacerbations. Discuss weather changes, air quality and the benefits of nasal washing. Review warning signs and symptoms to help prevent infections. Discuss techniques for effective airway clearance, coughing, and vibrations.   AED/CPR: - Group verbal and written instruction with the use of models to demonstrate the basic use of the AED with the basic ABC's of resuscitation.   Cardiac Rehab from 02/15/2017 in [facility] Cardiac and Pulmonary Rehab  Date  02/15/17  Educator  SB  Instruction Review Code  1- Actuary and Physiology of the Lungs: - Group verbal and written instruction with the use of models to provide basic lung anatomy and physiology related to function, structure and complications of lung disease.   Anatomy & Physiology of the Heart: - Group verbal and written instruction and models provide basic cardiac anatomy and physiology, with the coronary electrical and arterial systems. Review of Valvular disease and Heart Failure   Cardiac Rehab from 02/15/2017 in [facility] Cardiac and Pulmonary Rehab  Date  01/25/17  Educator  [facility]  Instruction Review Code  1- Verbalizes Understanding      Cardiac Medications: - Group verbal and written instruction to review commonly prescribed medications for heart disease. Reviews the medication, class of the drug, and side effects.   Cardiac Rehab from 02/15/2017 in [facility] Cardiac and Pulmonary Rehab  Date  01/18/17  Educator  [place]   Instruction Review Code  1- Verbalizes Understanding      Know Your Numbers and Risk Factors: -Group verbal and written instruction about important numbers in your health.  Discussion of what are risk factors and how they play a role in the disease process.  Review of Cholesterol, Blood Pressure, Diabetes, and BMI and the role they play in your overall health.   Sleep Hygiene: -Provides group verbal and written instruction about how sleep can affect your health.  Define sleep hygiene, discuss sleep cycles and impact of sleep habits. Review good sleep hygiene tips.    Other: -Provides group and verbal instruction on various topics (see comments)    Knowledge Questionnaire Score: Knowledge Questionnaire Score - 02/15/17 1514      Knowledge Questionnaire Score   Pre Score  17/18 Pulmonary         Core Components/Risk Factors/Patient Goals at Admission: Personal Goals and Risk Factors at Admission - 11/30/16 1406      Core Components/Risk Factors/Patient Goals on Admission    Weight Management  Yes;Weight Loss    Intervention  Weight Management: Develop a combined nutrition and exercise program designed to reach desired caloric intake, while maintaining appropriate intake of nutrient and fiber, sodium and fats, and appropriate energy expenditure required for the weight goal.;Weight Management: Provide education and appropriate resources to help participant work on and attain dietary goals. patient states he has lost weight recently without trying due to no appetite and not eating much.     Admit Weight  221 lb (100.2 kg)    Goal Weight: Short Term  215 lb (97.5 kg)    Goal Weight: Long Term  211 lb (95.7 kg)    Expected Outcomes  Long Term: Adherence to nutrition and physical activity/exercise program aimed toward attainment of established weight goal;Understanding recommendations for meals to include 15-35% energy as protein, 25-35% energy from fat, 35-60% energy from carbohydrates,  less than 273m of dietary cholesterol, 20-35 gm of total fiber daily;Understanding of distribution of calorie intake throughout the day with the consumption of 4-5 meals/snacks;Short Term: Continue to assess and modify interventions until short term weight is achieved;Weight Loss: Understanding of general recommendations for a balanced deficit meal plan, which promotes 1-2 lb weight loss per week and includes a negative energy balance of [phone] kcal/d    Improve shortness of breath with ADL's  Yes    Intervention  Provide education, individualized exercise plan and daily activity instruction to help decrease symptoms of SOB with activities of daily living.    Expected Outcomes  Short Term: Achieves a reduction of symptoms when performing activities of daily living.    Develop more efficient breathing techniques such as purse lipped breathing and diaphragmatic breathing; and practicing self-pacing with activity  Yes    Intervention  Provide education, demonstration and support about specific breathing techniuqes utilized for more efficient breathing. Include techniques such as pursed lipped breathing, diaphragmatic breathing and self-pacing activity.    Expected Outcomes  Short Term: Participant will be able to demonstrate and use breathing techniques as needed throughout daily activities.    Heart Failure  Yes    Intervention  Provide a combined exercise and nutrition program that is supplemented with education, support and counseling about heart failure. Directed toward relieving symptoms such as shortness of breath, decreased exercise tolerance, and extremity edema.    Expected Outcomes  Improve functional capacity of life;Short term: Attendance in program 2-3 days a week with increased exercise capacity. Reported  lower sodium intake. Reported increased fruit and vegetable intake. Reports medication compliance.;Short term: Daily weights obtained and reported for increase. Utilizing diuretic protocols  set by physician.;Long term: Adoption of self-care skills and reduction of barriers for early signs and symptoms recognition and intervention leading to self-care maintenance.    Hypertension  Yes    Intervention  Provide education on lifestyle modifcations including regular physical activity/exercise, weight management, moderate sodium restriction and increased consumption of fresh fruit, vegetables, and low fat dairy, alcohol moderation, and smoking cessation.;Monitor prescription use compliance.    Expected Outcomes  Short Term: Continued assessment and intervention until BP is < 140/50m HG in hypertensive participants. < 130/846mHG in hypertensive participants with diabetes, heart failure or chronic kidney disease.;Long Term: Maintenance of blood pressure at goal levels.    Lipids  Yes    Intervention  Provide education and support for participant on nutrition & aerobic/resistive exercise along with prescribed medications to achieve LDL <7071mHDL >59m92m  Expected Outcomes  Short Term: Participant states understanding of desired cholesterol values and is compliant with medications prescribed. Participant is following exercise prescription and nutrition guidelines.;Long Term: Cholesterol controlled with medications as prescribed, with individualized exercise RX and with personalized nutrition plan. Value goals: LDL < 70mg29mL > 40 mg.    Stress  Yes    Intervention  Offer individual and/or small group education and counseling on adjustment to heart disease, stress management and health-related lifestyle change. Teach and support self-help strategies.;Refer participants experiencing significant psychosocial distress to appropriate mental health specialists for further evaluation and treatment. When possible, include family members and significant others in education/counseling sessions.    Expected Outcomes  Short Term: Participant demonstrates changes in health-related behavior, relaxation and other  stress management skills, ability to obtain effective social support, and compliance with psychotropic medications if prescribed.;Long Term: Emotional wellbeing is indicated by absence of clinically significant psychosocial distress or social isolation.       Core Components/Risk Factors/Patient Goals Review:  Goals and Risk Factor Review    Row Name 12/16/16 0800 01/22/17 0805           Core Components/Risk Factors/Patient Goals Review   Personal Goals Review  Weight Management/Obesity;Heart Failure;Hypertension;Lipids;Improve shortness of breath with ADL's  Weight Management/Obesity;Heart Failure;Hypertension;Lipids;Improve shortness of breath with ADL's      Review  Rae iDwyane Luoff to a good start.  His weight was up 1/2 lbs today as he was eating during the snow days.  His breathing is already starting to get better.  He has not had symptoms of his heart failure.  He weighs daily and watches his sodium intake.  His  blood pressures have been good and he checks them at home on occasion.  He has not had any problems with his medications.   Rae cDwyane Luoinues to do well in rehab.  He is down to 219 Lbs today.  His appetite has not been good and he feels that he is not eating enough.  We will have him talk with nutrition again.  His breathing is getting better and he is able to do more around the house.  He has continues to do better with his heart failure, no swelling, just the shortness of breath.  He continues to watch his sodium intake and weighing daily.  He continues to do well with his blood pressures and check them at home.       Expected Outcomes  Short: Continue to work on weight loss and check blood  pressures a little more frequently.  Long: Continue to work risk factor modifications.   Short: Continue to work on weight loss and improving his SOB.  Long: Continue to work on risk factors.          Core Components/Risk Factors/Patient Goals at Discharge (Final Review):  Goals and Risk Factor Review  - 01/22/17 0805      Core Components/Risk Factors/Patient Goals Review   Personal Goals Review  Weight Management/Obesity;Heart Failure;Hypertension;Lipids;Improve shortness of breath with ADL's    Review  Jacob Avila continues to do well in rehab.  He is down to 219 Lbs today.  His appetite has not been good and he feels that he is not eating enough.  We will have him talk with nutrition again.  His breathing is getting better and he is able to do more around the house.  He has continues to do better with his heart failure, no swelling, just the shortness of breath.  He continues to watch his sodium intake and weighing daily.  He continues to do well with his blood pressures and check them at home.     Expected Outcomes  Short: Continue to work on weight loss and improving his SOB.  Long: Continue to work on risk factors.        ITP Comments: ITP Comments    Row Name 11/30/16 1358 12/16/16 1414 01/06/17 0632 01/18/17 0917 01/25/17 0934   ITP Comments  Medical Review Completed; initial ITP created. Diagnosis Documentation can be found in [place] encounter dated 11/07/2016.  Note sent with EKG strips to Dr Kandis Cocking regarding Rea's heartrate being eleveated above 100 during his visits. Getting as high as 160 during exercise exertion.   30 day review. Continue with ITP unless directed changes per Medical Director review.   No changes per MD after office visit  Jacob Avila had several PVC runs, which is consistant with his rhythm history, but today there seemed to be more than usual. He stated that he felt fine and took all his meds. RN will send today's rhythm strips to his cardiologist.   Jacob Avila mentioned that he was not feeling well today.  He couldn't really pinpoint anything in particular, just not feeling well.  He sat through class without at problem.  Jacob Avila was able to exercise today without a problem, but he just stayed on the NuStep today for the whole time.    [place] Name 01/26/17 1428 02/01/17 0855  02/03/17 0558 02/15/17 0807 02/22/17 1538   ITP Comments  [facility] in [facility] discharge planner called that Joash was NOT going to have home health but that he was being sent home on home supplemental oxgyen for recent history of pneumonia. . I suggested that she ask the MD to have [facility] complete Cardiac Rehab since it is basically the same staff in the same gym.   Jacob Avila was cleared to return to rehab after his hospitalization.  Documentation found in discharge summary from [facility] visit from ED 01/25/17 in Care Everywhere.  He is now on oxygen therapy for rest and exercise.  Oxygen saturations stayed above 90% during exercise today.   30 Day review. Continue with ITP unless directed changes per Medical Director review.   Discharge ITP sent and signed by Dr. Sabra Heck.  Discharge Summary routed to PCP and cardiologist.  Pt started in Pulmonary Rehab today.  Initial ITP created and sent to sign by Dr. Sabra Heck, Medical Director.  Documentation for diagnosis can be found in [place] encounter  from 02/09/17 from [facility].      Comments: Initial ITP

## 2017-02-22 NOTE — Progress Notes (Signed)
Daily Session Note  Patient Details  Name: Jacob Avila MRN: [id number] Date of Birth: 29-Apr-1927 Referring Provider:     Cardiac Rehab from 02/15/2017 in [facility] Cardiac and Pulmonary Rehab  Referring Provider  Deon Pilling      Encounter Date: 02/22/2017  Check In: Session Check In - 02/22/17 1011      Check-In   Location  [facility]-Cardiac & Pulmonary Rehab    Staff Present  Nada Maclachlan, BA, ACSM CEP, Exercise Physiologist;Joseph Tedd Sias, [place], ACSM CEP, Exercise Physiologist    Supervising physician immediately available to respond to emergencies  [facility] immediately available ER MD    Physician(s)  Alfred Levins and Jimmye Norman    Medication changes reported      No    Fall or balance concerns reported     No    Tobacco Cessation  No Change    Warm-up and Cool-down  Performed as group-led instruction    Resistance Training Performed  Yes    VAD Patient?  No      Pain Assessment   Currently in Pain?  No/denies          Social History   Tobacco Use  Smoking Status Former Smoker  . Types: Cigarettes  . Last attempt to quit: 01/05/1978  . Years since quitting: 39.1  Smokeless Tobacco Never Used    Goals Met:  Independence with exercise equipment Exercise tolerated well No report of cardiac concerns or symptoms Strength training completed today  Goals Unmet:  Not Applicable  Comments: First full day of exercise!  Patient was oriented to gym and equipment including functions, settings, policies, and procedures.  Patient's individual exercise prescription and treatment plan were reviewed.  All starting workloads were established based on the results of the 6 minute walk test done at initial orientation visit.  The plan for exercise progression was also introduced and progression will be customized based on patient's performance and goals.   Dr. Emily Filbert is Medical Director for [place] and [facility] Pulmonary  Rehabilitation.

## 2017-02-24 DIAGNOSIS — J849 Interstitial pulmonary disease, unspecified: Secondary | ICD-10-CM

## 2017-02-24 DIAGNOSIS — Z955 Presence of coronary angioplasty implant and graft: Secondary | ICD-10-CM | POA: Diagnosis not present

## 2017-02-24 NOTE — Progress Notes (Signed)
Daily Session Note  Patient Details  Name: Jacob Avila MRN: [id number] Date of Birth: Sep 13, 1927 Referring Provider:     Cardiac Rehab from 02/15/2017 in [facility] Cardiac and Pulmonary Rehab  Referring Provider  Deon Pilling      Encounter Date: 02/24/2017  Check In: Session Check In - 02/24/17 1013      Check-In   Location  [facility]-Cardiac & Pulmonary Rehab    Staff Present  Nada Maclachlan, BA, ACSM CEP, Exercise Physiologist;Jessica Luan Pulling, MA, RCEP, CCRP, Exercise Physiologist;Liba Hulsey Flavia Shipper    Supervising physician immediately available to respond to emergencies  [facility] immediately available ER MD    Physician(s)  Dr. Reita Cliche and Quentin Cornwall    Medication changes reported      No    Fall or balance concerns reported     No    Warm-up and Cool-down  Performed as group-led instruction    Resistance Training Performed  Yes    VAD Patient?  No      Pain Assessment   Currently in Pain?  No/denies          Social History   Tobacco Use  Smoking Status Former Smoker  . Types: Cigarettes  . Last attempt to quit: 01/05/1978  . Years since quitting: 39.1  Smokeless Tobacco Never Used    Goals Met:  Independence with exercise equipment Exercise tolerated well No report of cardiac concerns or symptoms Strength training completed today  Goals Unmet:  Not Applicable  Comments: Pt able to follow exercise prescription today without complaint.  Will continue to monitor for progression.   Dr. Emily Filbert is Medical Director for [place] and [facility] Pulmonary Rehabilitation.

## 2017-02-26 DIAGNOSIS — J849 Interstitial pulmonary disease, unspecified: Secondary | ICD-10-CM

## 2017-02-26 DIAGNOSIS — Z955 Presence of coronary angioplasty implant and graft: Secondary | ICD-10-CM | POA: Diagnosis not present

## 2017-02-26 NOTE — Progress Notes (Signed)
Daily Session Note  Patient Details  Name: KEY CEN MRN: [id number] Date of Birth: 1927/08/08 Referring Provider:     Cardiac Rehab from 02/15/2017 in [facility] Cardiac and Pulmonary Rehab  Referring Provider  Deon Pilling      Encounter Date: 02/26/2017  Check In: Session Check In - 02/26/17 1026      Check-In   Location  [facility]-Cardiac & Pulmonary Rehab    Staff Present  Joellyn Rued, BS, PEC;Meredith [place], RN BSN;Joseph Flavia Shipper    Supervising physician immediately available to respond to emergencies  [facility] immediately available ER MD    Physician(s)  Dr. Jacqualine Code and [facility]    Medication changes reported      No    Fall or balance concerns reported     No    Warm-up and Cool-down  Performed as group-led instruction    Resistance Training Performed  Yes    VAD Patient?  No      Pain Assessment   Currently in Pain?  No/denies          Social History   Tobacco Use  Smoking Status Former Smoker  . Types: Cigarettes  . Last attempt to quit: 01/05/1978  . Years since quitting: 39.1  Smokeless Tobacco Never Used    Goals Met:  Independence with exercise equipment Exercise tolerated well No report of cardiac concerns or symptoms Strength training completed today  Goals Unmet:  Not Applicable  Comments: Pt able to follow exercise prescription today without complaint.  Will continue to monitor for progression.   Dr. Emily Filbert is Medical Director for [place] and [facility].

## 2017-03-01 DIAGNOSIS — Z955 Presence of coronary angioplasty implant and graft: Secondary | ICD-10-CM | POA: Diagnosis not present

## 2017-03-01 DIAGNOSIS — J849 Interstitial pulmonary disease, unspecified: Secondary | ICD-10-CM

## 2017-03-01 NOTE — Progress Notes (Signed)
Daily Session Note  Patient Details  Name: Jacob Avila MRN: [id number] Date of Birth: 07-16-1927 Referring Provider:     Cardiac Rehab from 02/15/2017 in [facility] Cardiac and Pulmonary Rehab  Referring Provider  Jacob Avila      Encounter Date: 03/01/2017  Check In: Session Check In - 03/01/17 1108      Check-In   Location  [facility]-Cardiac & Pulmonary Rehab    Supervising physician immediately available to respond to emergencies  [facility] immediately available ER MD    Physician(s)  Jacob Avila and Jacob Avila    Medication changes reported      No    Fall or balance concerns reported     No    Warm-up and Cool-down  Performed as group-led instruction    Resistance Training Performed  Yes    VAD Patient?  No      Pain Assessment   Currently in Pain?  No/denies          Social History   Tobacco Use  Smoking Status Former Smoker  . Types: Cigarettes  . Last attempt to quit: 01/05/1978  . Years since quitting: 39.1  Smokeless Tobacco Never Used    Goals Met:  Independence with exercise equipment Exercise tolerated well No report of cardiac concerns or symptoms Strength training completed today  Goals Unmet:  Not Applicable  Comments: Reviewed home exercise with pt today.  Pt plans to walk continuously for 20-30 one extra day for exercise.  Reviewed THR, pulse, RPE, sign and symptoms, NTG use, and when to call 911 or MD.  Also discussed weather considerations and indoor options.  Pt voiced understanding. Jacob Avila is also using 1 liter of oxygen for exercise, will continue to monitor oxygen this week.   Jacob Avila is Medical Director for [place] and [facility].

## 2017-03-03 DIAGNOSIS — J849 Interstitial pulmonary disease, unspecified: Secondary | ICD-10-CM

## 2017-03-03 DIAGNOSIS — Z955 Presence of coronary angioplasty implant and graft: Secondary | ICD-10-CM | POA: Diagnosis not present

## 2017-03-03 NOTE — Progress Notes (Signed)
Daily Session Note  Patient Details  Name: Jacob Avila MRN: [id number] Date of Birth: [birth year] Referring Provider:     Cardiac Rehab from 02/15/2017 in [facility] Cardiac and Pulmonary Rehab  Referring Provider  Deon Pilling      Encounter Date: 03/03/2017  Check In: Session Check In - 03/03/17 1114      Check-In   Location  [facility]-Cardiac & Pulmonary Rehab    Staff Present  Nada Maclachlan, BA, ACSM CEP, Exercise Physiologist;Breeanne Oblinger Darrin Nipper, [place], RCEP, CCRP, Exercise Physiologist    Supervising physician immediately available to respond to emergencies  [facility] immediately available ER MD    Physician(s)  Dr. Reita Cliche and Jimmye Norman    Medication changes reported      No    Fall or balance concerns reported     No    Tobacco Cessation  No Change    Warm-up and Cool-down  Performed as group-led instruction    Resistance Training Performed  Yes    VAD Patient?  No      Pain Assessment   Currently in Pain?  No/denies          Social History   Tobacco Use  Smoking Status Former Smoker  . Types: Cigarettes  . Last attempt to quit: 01/05/1978  . Years since quitting: 39.1  Smokeless Tobacco Never Used    Goals Met:  Independence with exercise equipment Exercise tolerated well No report of cardiac concerns or symptoms Strength training completed today  Goals Unmet:  Not Applicable  Comments: Pt able to follow exercise prescription today without complaint.  Will continue to monitor for progression.   Dr. Emily Filbert is Medical Director for [place] and [facility].

## 2017-03-05 ENCOUNTER — Encounter: Payer: Medicare Other | Attending: Cardiovascular Disease | Admitting: *Deleted

## 2017-03-05 DIAGNOSIS — I440 Atrioventricular block, first degree: Secondary | ICD-10-CM | POA: Diagnosis not present

## 2017-03-05 DIAGNOSIS — Z87891 Personal history of nicotine dependence: Secondary | ICD-10-CM | POA: Diagnosis not present

## 2017-03-05 DIAGNOSIS — Z7902 Long term (current) use of antithrombotics/antiplatelets: Secondary | ICD-10-CM | POA: Insufficient documentation

## 2017-03-05 DIAGNOSIS — I10 Essential (primary) hypertension: Secondary | ICD-10-CM | POA: Insufficient documentation

## 2017-03-05 DIAGNOSIS — Z955 Presence of coronary angioplasty implant and graft: Secondary | ICD-10-CM | POA: Insufficient documentation

## 2017-03-05 DIAGNOSIS — Z7982 Long term (current) use of aspirin: Secondary | ICD-10-CM | POA: Insufficient documentation

## 2017-03-05 DIAGNOSIS — E785 Hyperlipidemia, unspecified: Secondary | ICD-10-CM | POA: Insufficient documentation

## 2017-03-05 DIAGNOSIS — I4891 Unspecified atrial fibrillation: Secondary | ICD-10-CM | POA: Diagnosis not present

## 2017-03-05 DIAGNOSIS — I2510 Atherosclerotic heart disease of native coronary artery without angina pectoris: Secondary | ICD-10-CM | POA: Insufficient documentation

## 2017-03-05 DIAGNOSIS — Z79899 Other long term (current) drug therapy: Secondary | ICD-10-CM | POA: Insufficient documentation

## 2017-03-05 DIAGNOSIS — J849 Interstitial pulmonary disease, unspecified: Secondary | ICD-10-CM

## 2017-03-05 NOTE — Progress Notes (Signed)
Daily Session Note  Patient Details  Name: Jacob Avila MRN: [id number] Date of Birth: [birth year] Referring Provider:     Cardiac Rehab from 02/15/2017 in [facility] Cardiac and Pulmonary Rehab  Referring Provider  Deon Pilling      Encounter Date: 03/05/2017  Check In: Session Check In - 03/05/17 1034      Check-In   Location  [facility]-Cardiac & Pulmonary Rehab    Staff Present  Renita Papa, RN Vickki Hearing, BA, ACSM CEP, Exercise Physiologist;Krista Frederico Hamman, RN BSN    Supervising physician immediately available to respond to emergencies  [facility] immediately available ER MD    Physician(s)  Dr. Kerman Passey and Clearnce Hasten    Fall or balance concerns reported     No    Warm-up and Cool-down  Performed as group-led instruction    Resistance Training Performed  Yes    VAD Patient?  No      Pain Assessment   Currently in Pain?  No/denies          Social History   Tobacco Use  Smoking Status Former Smoker  . Types: Cigarettes  . Last attempt to quit: 01/05/1978  . Years since quitting: 39.1  Smokeless Tobacco Never Used    Goals Met:  Proper associated with RPD/PD & O2 Sat Independence with exercise equipment Using PLB without cueing & demonstrates good technique Exercise tolerated well Strength training completed today  Goals Unmet:  Not Applicable  Comments: Pt able to follow exercise prescription today without complaint.  Will continue to monitor for progression.    Dr. Emily Filbert is Medical Director for [place] and [facility].

## 2017-03-10 DIAGNOSIS — J849 Interstitial pulmonary disease, unspecified: Secondary | ICD-10-CM

## 2017-03-10 DIAGNOSIS — Z955 Presence of coronary angioplasty implant and graft: Secondary | ICD-10-CM | POA: Diagnosis not present

## 2017-03-10 NOTE — Progress Notes (Signed)
Daily Session Note  Patient Details  Name: Jacob Avila MRN: [id number] Date of Birth: [birth year] Referring Provider:     Cardiac Rehab from 02/15/2017 in [facility] Cardiac and Pulmonary Rehab  Referring Provider  Deon Pilling      Encounter Date: 03/10/2017  Check In: Session Check In - 03/10/17 1016      Check-In   Location  [facility]-Cardiac & Pulmonary Rehab    Staff Present  Justin Mend Lorre Nick, MA, RCEP, CCRP, Exercise Physiologist;Amanda Oletta Darter, [place], ACSM CEP, Exercise Physiologist    Supervising physician immediately available to respond to emergencies  [facility] immediately available ER MD    Physician(s)  Dr. Joni Fears and Jimmye Norman    Medication changes reported      Yes    Comments  [name] is off oxygen per Doctors orders    Tobacco Cessation  No Change    Warm-up and Cool-down  Performed as group-led instruction    Resistance Training Performed  Yes    VAD Patient?  No      Pain Assessment   Currently in Pain?  No/denies          Social History   Tobacco Use  Smoking Status Former Smoker  . Types: Cigarettes  . Last attempt to quit: 01/05/1978  . Years since quitting: 39.2  Smokeless Tobacco Never Used    Goals Met:  Independence with exercise equipment Exercise tolerated well Personal goals reviewed No report of cardiac concerns or symptoms Strength training completed today  Goals Unmet:  Not Applicable  Comments: Pt able to follow exercise prescription today without complaint.  Will continue to monitor for progression.   Dr. Emily Filbert is Medical Director for [place] and [facility].

## 2017-03-12 ENCOUNTER — Encounter: Payer: Medicare Other | Admitting: *Deleted

## 2017-03-12 DIAGNOSIS — J849 Interstitial pulmonary disease, unspecified: Secondary | ICD-10-CM

## 2017-03-12 DIAGNOSIS — Z955 Presence of coronary angioplasty implant and graft: Secondary | ICD-10-CM | POA: Diagnosis not present

## 2017-03-12 NOTE — Progress Notes (Signed)
Daily Session Note  Patient Details  Name: Jacob Avila MRN: [id number] Date of Birth: 07-05-1927 Referring Provider:     Cardiac Rehab from 02/15/2017 in [facility] Cardiac and Pulmonary Rehab  Referring Provider  Deon Pilling      Encounter Date: 03/12/2017  Check In: Session Check In - 03/12/17 1036      Check-In   Location  [facility]-Cardiac & Pulmonary Rehab    Staff Present  Renita Papa, RN Vickki Hearing, BA, ACSM CEP, Exercise Physiologist;Mary Kellie Shropshire, RN, BSN, MA    Supervising physician immediately available to respond to emergencies  [facility] immediately available ER MD    Physician(s)   Dr. Reita Cliche and Archie Balboa    Medication changes reported      No    Fall or balance concerns reported     No    Warm-up and Cool-down  Performed as group-led instruction    Resistance Training Performed  Yes    VAD Patient?  No      Pain Assessment   Currently in Pain?  No/denies          Social History   Tobacco Use  Smoking Status Former Smoker  . Types: Cigarettes  . Last attempt to quit: 01/05/1978  . Years since quitting: 39.2  Smokeless Tobacco Never Used    Goals Met:  Proper associated with RPD/PD & O2 Sat Independence with exercise equipment Using PLB without cueing & demonstrates good technique Exercise tolerated well Strength training completed today  Goals Unmet:  Not Applicable  Comments: Pt able to follow exercise prescription today without complaint.  Will continue to monitor for progression.    Dr. Emily Filbert is Medical Director for [place] and [facility].

## 2017-03-15 DIAGNOSIS — J849 Interstitial pulmonary disease, unspecified: Secondary | ICD-10-CM

## 2017-03-15 DIAGNOSIS — Z955 Presence of coronary angioplasty implant and graft: Secondary | ICD-10-CM | POA: Diagnosis not present

## 2017-03-15 NOTE — Progress Notes (Signed)
Pulmonary Individual Treatment Plan  Patient Details  Name: Jacob Avila MRN: [id number] Date of Birth: 09/25/1927 Referring Provider:     Cardiac Rehab from 02/15/2017 in [facility]  Referring Provider  Deon Pilling      Initial Encounter Date:    Cardiac Rehab from 02/15/2017 in [facility] Cardiac and Pulmonary Rehab  Date  02/18/17 [Starting Pulmonary on 2/18]  Referring Provider  Deon Pilling      Visit Diagnosis: ILD (interstitial lung disease) ([place])  Patient's Home Medications on Admission:  Current Outpatient Medications:  .  aspirin EC 81 MG tablet, Take 81 mg by mouth., Disp: , Rfl:  .  atorvastatin (LIPITOR) 80 MG tablet, Take 80 mg by mouth., Disp: , Rfl:  .  clopidogrel (PLAVIX) 75 MG tablet, Take 75 mg by mouth., Disp: , Rfl:  .  furosemide (LASIX) 20 MG tablet, Take 40 mg by mouth., Disp: , Rfl:  .  lisinopril (PRINIVIL,ZESTRIL) 2.5 MG tablet, Take 2.5 mg by mouth., Disp: , Rfl:  .  metoprolol succinate (TOPROL-XL) 50 MG 24 hr tablet, Take 50 mg by mouth., Disp: , Rfl:  .  omeprazole (PRILOSEC) 20 MG capsule, Take 20 mg by mouth., Disp: , Rfl:  .  simvastatin (ZOCOR) 40 MG tablet, Take 40 mg by mouth., Disp: , Rfl:   Past Medical History: Past Medical History:  Diagnosis Date  . 1st degree AV block 05/13/2012  . Arteriosclerosis of coronary artery 11/12/2014  . Atrial fibrillation ([place]) 11/30/2016  . BP (high blood pressure) 05/12/2012  . Bradycardia 05/12/2012  . Dizziness 05/12/2012  . HLD (hyperlipidemia)     Tobacco Use: Social History   Tobacco Use  Smoking Status Former Smoker  . Types: Cigarettes  . Last attempt to quit: 01/05/1978  . Years since quitting: 39.2  Smokeless Tobacco Never Used    Labs: Recent Review Flowsheet Data    There is no flowsheet data to display.       Pulmonary Assessment Scores: Pulmonary Assessment Scores    Row Name 02/15/17 1514 02/22/17 1147       ADL UCSD   ADL Phase  Entry  Entry    SOB  Score total  80  -    Rest  2  -    Walk  3  -    Stairs  5  -    Bath  2  -    Dress  3  -    Shop  4  -      CAT Score   CAT Score  28  -      mMRC Score   mMRC Score  -  1       Pulmonary Function Assessment:   Exercise Target Goals:    Exercise Program Goal: Individual exercise prescription set using results from initial 6 min walk test and THRR while considering  patient's activity barriers and safety.    Exercise Prescription Goal: Initial exercise prescription builds to 30-45 minutes a day of aerobic activity, 2-3 days per week.  Home exercise guidelines will be given to patient during program as part of exercise prescription that the participant will acknowledge.  Activity Barriers & Risk Stratification:   6 Minute Walk: 6 Minute Walk    Row Name 02/03/17 0905 02/05/17 0923 02/18/17 1202     6 Minute Walk   Phase  Discharge  Discharge  Initial using cardiac post 6MWT   Distance  1160 feet now wearing O2  continuously  1530 feet  1530 feet   Distance % Change  -8.3 %  21 %  -   Distance Feet Change  -105 ft  265 ft  -   Walk Time  6 minutes  6 minutes  6 minutes   # of Rest Breaks  0  0  0   MPH  2.2  2.89  2.89   METS  2.42  3.43  3.43   RPE  15  15  15    Perceived Dyspnea   1  -  1   VO2 Peak  8.46  12  12   Symptoms  Yes (comment)  -  Yes (comment)   Comments  SOB  -  SOB   Resting HR  94 bpm  -  94 bpm   Resting BP  106/60  -  -   Resting Oxygen Saturation   95 %  -  95 %   Exercise Oxygen Saturation  during 6 min walk  92 %  -  92 %   Max Ex. HR  150 bpm  172 bpm  172 bpm   Max Ex. BP  146/70  152/84  152/84     Interval HR   1 Minute HR  126  -  -   2 Minute HR  137  -  -   3 Minute HR  130  -  -   4 Minute HR  148  -  -   5 Minute HR  143  -  -   6 Minute HR  146  -  -   Interval Heart Rate?  Yes  -  -     Interval Oxygen   Interval Oxygen?  Yes  -  -   Baseline Oxygen Saturation %  95 %  -  -   1 Minute Oxygen Saturation %  96 %  -  -    1 Minute Liters of Oxygen  2 L  -  -   2 Minute Oxygen Saturation %  96 %  -  -   2 Minute Liters of Oxygen  2 L  -  -   3 Minute Oxygen Saturation %  93 %  -  -   3 Minute Liters of Oxygen  2 L  -  -   4 Minute Oxygen Saturation %  97 %  -  -   4 Minute Liters of Oxygen  2 L  -  -   5 Minute Oxygen Saturation %  92 %  -  -   5 Minute Liters of Oxygen  2 L  -  -   6 Minute Oxygen Saturation %  94 %  -  -   6 Minute Liters of Oxygen  2 L  -  -     Oxygen Initial Assessment: Oxygen Initial Assessment - 02/01/17 0858      Home Oxygen   Home Oxygen Device  Home Concentrator;E-Tanks    Sleep Oxygen Prescription  Continuous    Liters per minute  2    Home Exercise Oxygen Prescription  Continuous    Liters per minute  2    Home at Rest Exercise Oxygen Prescription  Continuous    Liters per minute  2    Compliance with Home Oxygen Use  Yes      Program Oxygen Prescription   Program Oxygen Prescription  Continuous;E-Tanks    Liters per minute  2  Intervention   Short Term Goals  To learn and exhibit compliance with exercise, home and travel O2 prescription;To learn and understand importance of maintaining oxygen saturations>88%;To learn and understand importance of monitoring SPO2 with pulse oximeter and demonstrate accurate use of the pulse oximeter.;To learn and demonstrate proper pursed lip breathing techniques or other breathing techniques.    Long  Term Goals  Maintenance of O2 saturations>88%;Exhibits compliance with exercise, home and travel O2 prescription;Verbalizes importance of monitoring SPO2 with pulse oximeter and return demonstration;Exhibits proper breathing techniques, such as pursed lip breathing or other method taught during program session       Oxygen Re-Evaluation: Oxygen Re-Evaluation    Row Name 02/01/17 0859 02/22/17 1014 03/02/17 1702 03/10/17 1027       Program Oxygen Prescription   Program Oxygen Prescription  Continuous;E-Tanks   Continuous;E-Tanks  Continuous;E-Tanks  None    Liters per minute  2  2  2   -      Home Oxygen   Home Oxygen Device  Home Concentrator;E-Tanks  Home Concentrator;E-Tanks  Home Concentrator;E-Tanks  None    Sleep Oxygen Prescription  Continuous  Continuous  Continuous  None    Liters per minute  2  -  2  -    Home Exercise Oxygen Prescription  Continuous  Continuous  Continuous  None    Liters per minute  2  2  2   -    Home at Rest Exercise Oxygen Prescription  Continuous  Continuous  Continuous  None    Liters per minute  2  2  2   -    Compliance with Home Oxygen Use  Yes  Yes  Yes  -      Goals/Expected Outcomes   Short Term Goals  To learn and exhibit compliance with exercise, home and travel O2 prescription;To learn and understand importance of maintaining oxygen saturations>88%;To learn and understand importance of monitoring SPO2 with pulse oximeter and demonstrate accurate use of the pulse oximeter.;To learn and demonstrate proper pursed lip breathing techniques or other breathing techniques.  To learn and exhibit compliance with exercise, home and travel O2 prescription;To learn and understand importance of maintaining oxygen saturations>88%;To learn and understand importance of monitoring SPO2 with pulse oximeter and demonstrate accurate use of the pulse oximeter.;To learn and demonstrate proper pursed lip breathing techniques or other breathing techniques.  To learn and exhibit compliance with exercise, home and travel O2 prescription;To learn and understand importance of maintaining oxygen saturations>88%;To learn and understand importance of monitoring SPO2 with pulse oximeter and demonstrate accurate use of the pulse oximeter.;To learn and demonstrate proper pursed lip breathing techniques or other breathing techniques.  To learn and understand importance of maintaining oxygen saturations>88%;To learn and demonstrate proper use of respiratory medications;To learn and demonstrate proper  pursed lip breathing techniques or other breathing techniques.;To learn and understand importance of monitoring SPO2 with pulse oximeter and demonstrate accurate use of the pulse oximeter.    Long  Term Goals  Maintenance of O2 saturations>88%;Exhibits compliance with exercise, home and travel O2 prescription;Verbalizes importance of monitoring SPO2 with pulse oximeter and return demonstration;Exhibits proper breathing techniques, such as pursed lip breathing or other method taught during program session  Maintenance of O2 saturations>88%;Exhibits compliance with exercise, home and travel O2 prescription;Verbalizes importance of monitoring SPO2 with pulse oximeter and return demonstration;Exhibits proper breathing techniques, such as pursed lip breathing or other method taught during program session  Maintenance of O2 saturations>88%;Exhibits compliance with exercise, home and travel O2 prescription;Verbalizes importance  of monitoring SPO2 with pulse oximeter and return demonstration;Exhibits proper breathing techniques, such as pursed lip breathing or other method taught during program session  Demonstrates proper use of MDI's;Compliance with respiratory medication;Exhibits proper breathing techniques, such as pursed lip breathing or other method taught during program session;Maintenance of O2 saturations>88%;Verbalizes importance of monitoring SPO2 with pulse oximeter and return demonstration    Comments  Reviewed PLB technique with pt.  Talked about how it work and it's important to maintaining his exercise saturations.    Reviewed PLB technique with pt.  Talked about how it work and it's important to maintaining his exercise saturations.    [name] has been using his own pulse oximeter which he brought to class for instructions for use. His oxygen levels have been in the mid to high 90's on 2 liters of oxygen. Next session he will start using 1 liter of oxygen for each of his exercises. He sees his  pulmonologist on Monday and will tell the doctor of the results.  Rae's doctor stated that he did not need oxygen after doing rehab. His oxygen was Tapered off liter by liter. His oxygen has been in the high 90's on his exercise equipment with no oxygen.    Goals/Expected Outcomes  Short: Become more profiecient at using PLB.   Long: Become independent at using PLB.  Short: Become more profiecient at using PLB.   Long: Become independent at using PLB.  Short: use 1 liter less of oxygen for exercise. Long: wean off oxygen for exercise.  Short: continue to monitor oxygen at home. Long: Monitor oxygen at home independently       Oxygen Discharge (Final Oxygen Re-Evaluation): Oxygen Re-Evaluation - 03/10/17 1027      Program Oxygen Prescription   Program Oxygen Prescription  None      Home Oxygen   Home Oxygen Device  None    Sleep Oxygen Prescription  None    Home Exercise Oxygen Prescription  None    Home at Rest Exercise Oxygen Prescription  None      Goals/Expected Outcomes   Short Term Goals  To learn and understand importance of maintaining oxygen saturations>88%;To learn and demonstrate proper use of respiratory medications;To learn and demonstrate proper pursed lip breathing techniques or other breathing techniques.;To learn and understand importance of monitoring SPO2 with pulse oximeter and demonstrate accurate use of the pulse oximeter.    Long  Term Goals  Demonstrates proper use of MDI's;Compliance with respiratory medication;Exhibits proper breathing techniques, such as pursed lip breathing or other method taught during program session;Maintenance of O2 saturations>88%;Verbalizes importance of monitoring SPO2 with pulse oximeter and return demonstration    Comments  Rae's doctor stated that he did not need oxygen after doing rehab. His oxygen was Tapered off liter by liter. His oxygen has been in the high 90's on his exercise equipment with no oxygen.    Goals/Expected Outcomes   Short: continue to monitor oxygen at home. Long: Monitor oxygen at home independently       Initial Exercise Prescription: Initial Exercise Prescription - 02/18/17 1200      Date of Initial Exercise RX and Referring Provider   Date  02/18/17 Starting Pulmonary on 2/18    Referring Provider  Deon Pilling      Oxygen   Oxygen  Continuous    Liters  2      Recumbant Bike   Level  4    RPM  30    Watts  20  Minutes  15    METs  2.79      NuStep   Level  6    SPM  80    Minutes  15    METs  3.5      Track   Laps  30    Minutes  15    METs  2.38      Prescription Details   Frequency (times per week)  3    Duration  Progress to 45 minutes of aerobic exercise without signs/symptoms of physical distress      Intensity   THRR 40-80% of Max Heartrate  110-125    Ratings of Perceived Exertion  11-13    Perceived Dyspnea  0-4      Progression   Progression  Continue to progress workloads to maintain intensity without signs/symptoms of physical distress.      Resistance Training   Training Prescription  Yes    Weight  4 lbs    Reps  10-15       Perform Capillary Blood Glucose checks as needed.  Exercise Prescription Changes: Exercise Prescription Changes    Row Name 01/25/17 1500 02/10/17 1400 03/03/17 1500         Response to Exercise   Blood Pressure (Admit)  114/60  112/72  116/68     Blood Pressure (Exercise)  112/60  122/62  -     Blood Pressure (Exit)  118/60  132/64  106/56     Heart Rate (Admit)  124 bpm  125 bpm  102 bpm     Heart Rate (Exercise)  134 bpm  150 bpm  144 bpm     Heart Rate (Exit)  112 bpm  102 bpm  106 bpm     Rating of Perceived Exertion (Exercise)  13  12  12      Perceived Dyspnea (Exercise)  -  -  1     Symptoms  none  none  none     Duration  Continue with 45 min of aerobic exercise without signs/symptoms of physical distress.  Continue with 45 min of aerobic exercise without signs/symptoms of physical distress.  Continue with 45  min of aerobic exercise without signs/symptoms of physical distress.     Intensity  THRR unchanged  THRR unchanged  THRR unchanged       Progression   Progression  Continue to progress workloads to maintain intensity without signs/symptoms of physical distress.  Continue to progress workloads to maintain intensity without signs/symptoms of physical distress.  Continue to progress workloads to maintain intensity without signs/symptoms of physical distress.     Average METs  2.63  2.96  3.4       Resistance Training   Training Prescription  Yes  Yes  Yes     Weight  4 lbs  4 lbs  4 lb     Reps  10-15  10-15  10-15       Interval Training   Interval Training  No  No  No       Oxygen   Oxygen  -  -  Continuous     Liters  -  -  1       Recumbant Bike   Level  4  4  -     Watts  19  22  -     Minutes  15  15  -     METs  2.79  2.79  -  NuStep   Level  6  6  6      SPM  -  -  80     Minutes  15  15  15      METs  2.9  3.7  3.8       Track   Laps  30  30  44     Minutes  15  15  15      METs  2.38  2.38  3.07       Home Exercise Plan   Plans to continue exercise at  Home (comment) walking  Home (comment) walking  Home (comment) walking     Frequency  Add 2 additional days to program exercise sessions.  Add 2 additional days to program exercise sessions.  Add 2 additional days to program exercise sessions.     Initial Home Exercises Provided  12/16/16  12/16/16  12/16/16        Exercise Comments: Exercise Comments    Row Name 01/25/17 1610 02/15/17 0808 02/22/17 1013       Exercise Comments  [name] mentioned that he was not feeling well today.  He couldn't really pinpoint anything in particular, just not feeling well.  He sat through class without at problem.  [name] was able to exercise today without a problem, but he just stayed on the NuStep today for the whole time.   Tildon graduated today from cardiac rehab with 36 sessions completed.  Details of the patient's exercise  prescription and what He needs to do in order to continue the prescription and progress were discussed with patient.  Patient was given a copy of prescription and goals.  Patient verbalized understanding.  Nasiah plans to continue to exercise by coming into Pulmonary Rehab.  First full day of exercise!  Patient was oriented to gym and equipment including functions, settings, policies, and procedures.  Patient's individual exercise prescription and treatment plan were reviewed.  All starting workloads were established based on the results of the 6 minute walk test done at initial orientation visit.  The plan for exercise progression was also introduced and progression will be customized based on patient's performance and goals.        Exercise Goals and Review:   Exercise Goals Re-Evaluation : Exercise Goals Re-Evaluation    Row Name 01/22/17 0800 01/25/17 1542 02/10/17 1443 02/22/17 1013 03/01/17 1111     Exercise Goal Re-Evaluation   Exercise Goals Review  Increase Physical Activity;Increase Strength and Stamina;Understanding of Exercise Prescription  Increase Physical Activity;Increase Strength and Stamina;Understanding of Exercise Prescription  Increase Physical Activity;Increase Strength and Stamina;Understanding of Exercise Prescription  Understanding of Exercise Prescription;Knowledge and understanding of Target Heart Rate Range (THRR);Able to understand and use Dyspnea scale;Able to understand and use rate of perceived exertion (RPE) scale  Increase Physical Activity;Increase Strength and Stamina   Comments  [name] has been doing well in rehab.  He has only been doing some of his exercise at home.  He stays active but not actually taking the time to go for his walk.  He said that it would help if it had more incentive to do his walking.  He says he lacks a appetitie, maybe more exercise can help.  He is open to trying it out.   [name] has continued to do well in rehab.  Today, he did not feel as good  so he stayed on the NuStep the whole time.  He has worked upto 19 watts on the recumbent bike.  We  will continue to monitor his progression.   [name] will be graduating on Friday!!  He plans to continue to exercise by coming to Pulmonary Rehab.   Reviewed RPE scale, THR and program prescription with pt today.  Pt voiced understanding and was given a copy of goals to take home.   Reviewed home exercise with pt today.  Pt plans to walk continuously for 20-30 one extra day for exercise.  Reviewed THR, pulse, RPE, sign and symptoms, NTG use, and when to call 911 or MD.  Also discussed weather considerations and indoor options.  Pt voiced understanding.   Expected Outcomes  Short: Walk more at home to help with appetite.  Long: Continue to work on IT sales professional.  Short: Continue to try to increase number of laps walking back to where he was before.  Long: Continue to exercise more to work on strength and stamina.   Short: Graduation.  Long: Enroll in pulmonary rehab to continue to exercise.   Short: Use RPE daily to regulate intensity.  Long: Follow program prescription in THR.  Short: walk 2 extra day a week for 20-30 minutes. Long: Maintain home exercise independently   [place] Name 03/03/17 1530 03/10/17 1038           Exercise Goal Re-Evaluation   Exercise Goals Review  Increase Physical Activity;Able to understand and use rate of perceived exertion (RPE) scale;Knowledge and understanding of Target Heart Rate Range (THRR);Able to understand and use Dyspnea scale  Increase Physical Activity;Increase Strength and Stamina      Comments  [name] tried exercise with 1L of O2 today and stayed at 99%.  Staff wil continue to monitor  [name] is off oxygen while he exercises and is doing very well. [place] Informed him that he needs to continue exercising after he is done with the program.      Expected Outcomes  Short - [name] will continue to use 1 L O2 and maintain  proper levels during exercise Long - [name] would like to exercise without O2  Short: continue exercising while keeping his oxygen level above 88 percent. Long: maintain exercise at lease 3-5 days a week for 30 minutes independently.         Discharge Exercise Prescription (Final Exercise Prescription Changes): Exercise Prescription Changes - 03/03/17 1500      Response to Exercise   Blood Pressure (Admit)  116/68    Blood Pressure (Exit)  106/56    Heart Rate (Admit)  102 bpm    Heart Rate (Exercise)  144 bpm    Heart Rate (Exit)  106 bpm    Rating of Perceived Exertion (Exercise)  12    Perceived Dyspnea (Exercise)  1    Symptoms  none    Duration  Continue with 45 min of aerobic exercise without signs/symptoms of physical distress.    Intensity  THRR unchanged      Progression   Progression  Continue to progress workloads to maintain intensity without signs/symptoms of physical distress.    Average METs  3.4      Resistance Training   Training Prescription  Yes    Weight  4 lb    Reps  10-15      Interval Training   Interval Training  No      Oxygen   Oxygen  Continuous    Liters  1      NuStep   Level  6    SPM  80    Minutes  15    METs  3.8      Track   Laps  44    Minutes  15    METs  3.07      Home Exercise Plan   Plans to continue exercise at  Home (comment) walking    Frequency  Add 2 additional days to program exercise sessions.    Initial Home Exercises Provided  12/16/16       Nutrition:  Target Goals: Understanding of nutrition guidelines, daily intake of sodium <1553m, cholesterol <2022m calories 30% from fat and 7% or less from saturated fats, daily to have 5 or more servings of fruits and vegetables.  Biometrics:  PoBarbie Haggis 02/03/17 [id number]     Post  Biometrics   Height  6' 0.5" (1.842 m)    Weight  211 lb (95.7 kg)    Waist Circumference  38 inches    Hip Circumference  43 inches    Waist to Hip Ratio  0.88 %    BMI  (Calculated)  28.21    Single Leg Stand  1.66 seconds       Nutrition Therapy Plan and Nutrition Goals: Nutrition Therapy & Goals - 02/03/17 0922      Nutrition Therapy   Diet  TLC    Drug/Food Interactions  Statins/Certain Fruits    Protein (specify units)  9oz    Fiber  36 grams    Saturated Fats  16 max. grams    Fruits and Vegetables  6 servings/day    Sodium  2000 grams      Personal Nutrition Goals   Nutrition Goal  Choose foods with strong flavors "twang" and aromas to help stimulate appetite    Personal Goal #2  Continue to drink Ensure supplement drinks on a regular basis until appetite improves    Personal Goal #3  Eat a source of protein at each meal and snack      Intervention Plan   Intervention  Prescribe, educate and counsel regarding individualized specific dietary modifications aiming towards targeted core components such as weight, hypertension, lipid management, diabetes, heart failure and other comorbidities.    Expected Outcomes  Short Term Goal: Understand basic principles of dietary content, such as calories, fat, sodium, cholesterol and nutrients.;Short Term Goal: A plan has been developed with personal nutrition goals set during dietitian appointment.;Long Term Goal: Adherence to prescribed nutrition plan.       Nutrition Assessments: Nutrition Assessments - 02/15/17 1513      MEDFICTS Scores   Pre Score  6    Post Score  30    Score Difference  24       Nutrition Goals Re-Evaluation: Nutrition Goals Re-Evaluation    Row Name 01/22/17 0808 03/10/17 1041           Goals   Current Weight  219 lb (99.3 kg)  216 lb (98 kg)      Nutrition Goal  Meet with LiLattie Hawo talk about appetitie loss and how to kick it up a notch.  Also would like to continue to lose weight.   Maintain or Lose Weight.      Comment  RaDwyane Luoissed his appointmet with nutrition.  We will reschedule to happen during class on Jan 30.  RaDwyane Luotates he is happy with his weight and if he  loses weight that is ok with him. He tries to eat a  healthier diet.       Expected Outcome  Short: Meet with Lattie Haw.  Long: Apply some tips to his diet and maintain heart healthy diet.   Short: make healthier food choices after [facility]. Long: Maintain a hearth healthy diet         Nutrition Goals Discharge (Final Nutrition Goals Re-Evaluation): Nutrition Goals Re-Evaluation - 03/10/17 1041      Goals   Current Weight  216 lb (98 kg)    Nutrition Goal  Maintain or Lose Weight.    Comment  [name] states he is happy with his weight and if he loses weight that is ok with him. He tries to eat a healthier diet.     Expected Outcome  Short: make healthier food choices after [facility]. Long: Maintain a hearth healthy diet       Psychosocial: Target Goals: Acknowledge presence or absence of significant depression and/or stress, maximize coping skills, provide positive support system. Participant is able to verbalize types and ability to use techniques and skills needed for reducing stress and depression.   Initial Review & Psychosocial Screening:   Quality of Life Scores: Quality of Life - 02/15/17 1511      Quality of Life Scores   Health/Function Pre  27.17 %    Health/Function Post  24.4 %    Health/Function % Change  -10.2 %    Socioeconomic Pre  30 %    Socioeconomic Post  27.75 %    Socioeconomic % Change   -7.5 %    Psych/Spiritual Pre  30 %    Psych/Spiritual Post  30 %    Psych/Spiritual % Change  0 %    Family Pre  30 %    Family Post  30 %    Family % Change  0 %    GLOBAL Pre  28.78 %    GLOBAL Post  27.09 %    GLOBAL % Change  -5.87 %      Scores of 19 and below usually indicate a poorer quality of life in these areas.  A difference of  2-3 points is a clinically meaningful difference.  A difference of 2-3 points in the total score of the Quality of Life Index has been associated with significant improvement in overall quality of life, self-image, physical symptoms, and  general health in studies assessing change in quality of life.  PHQ-9: Recent Review Flowsheet Data    Depression screen [facility] 2/9 02/15/2017 02/15/2017 01/25/2017 12/07/2016 11/30/2016   Decreased Interest 2 - 2 2 2    Down, Depressed, Hopeless 2 - 1 1 2    PHQ - 2 Score 4 - 3 3 4    Altered sleeping 1 1 1  0 2   Tired, decreased energy 3 - 2 2 3    Change in appetite 0 - 3 3 3    Feeling bad or failure about yourself  0 - 1 0 0   Trouble concentrating 0 - 0 1 2   Moving slowly or fidgety/restless 0 - 0 1 1   Suicidal thoughts 0 - 1 0 0   PHQ-9 Score 8 - 11 10 15    Difficult doing work/chores - - Somewhat difficult Somewhat difficult Somewhat difficult     Interpretation of Total Score  Total Score Depression Severity:  1-4 = Minimal depression, 5-9 = Mild depression, 10-14 = Moderate depression, 15-19 = Moderately severe depression, 20-27 = Severe depression   Psychosocial Evaluation and Intervention: Psychosocial Evaluation - 02/15/17 6578  Discharge Psychosocial Assessment & Intervention   Comments  Counselor met with Mr. Blyden [name]) for discharge evaluation.  He reports finishing this program today and beginning with Pulmonary Rehab next Monday.  He plans to walk several times this week to maintain his progress.  [name] states he is feeling stronger; breathing better; and having more energy overall.  This reportedly impacts his mood as he reports feeling less depressed.  His PHQ-9 dropped several points from "11" to an "8" from moderate down to mild depressive symptoms.  He denies thoughts of self-harm and recognizes he has "much to live for" at the age of 39.  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  He also has a tractor and when the weather breaks to get out and begin some gardening.  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  Counselor commended [name] on all  his progress and hard work.  He is committed to consistent exercise and this has been very positive for him.  Counselor will follow him in Pulmonary Rehab as well.         Psychosocial Re-Evaluation: Psychosocial Re-Evaluation    Row Name 01/22/17 [phone] 01/25/17 0916 02/03/17 1002 03/10/17 1043       Psychosocial Re-Evaluation   Current issues with  Current Stress Concerns  Current Stress Concerns  -  None Identified    Comments  [name] has continued to do well in rehab.  He still would like to go back to work, but he is waiting for his doctor to clear him to return.  He spoke with them yesterday and is awaiting a response.  Overall, he is feeling better but he does have days where he doesn't feel 100% still.  He is postivie and has a strong support system.  He has also been sleeping better.  Biggest benefit of rehab has been getting stronger as he was feeling weak.  Also if he could eat better that would help to improve his strength too.   Counselor follow up with [name] today reporting continues to have appetite problems which are impacting his energy level and mood to some degree.  He states this has been an ongoing concern since his recent cardiac issue wherein he has to "force food."  He is losing a little weight but nothing significant.  [name] has an appointment to meet with the Dietician later this month re: this.  counselor reviewed Rae's PHQ-9 depression inventory which increased "1" point from a "10" initially to an "11," indicating [name] may be moderately depressed.  He also stated he has "thoughts" of self harm sometimes and counselor assessed this with [name] reporting no plan - but because he feels so badly at times with lack of energy and appetite and some sleep issues, these thoughts do occur on occasion.  [name] also reports he has tried to reach his Dr. to discuss this but is having difficulty connecting.  Counselor and staff will follow with [name] through the dietician's meeting and recommendations.   Counselor also recommended that [name] continue to try to reach his Dr. to report these concerns.  He plans to consult with his spouse and his daughter for concensus on how to move forward on these suggestions.    Counselor met with [name] today stating his appetite and energy this week are "a little better" as he  is working out more consistently.  He was scheduled to meet with the dietician today and staff will follow with him after that occurs.    [name] states his stress has gone down dramatically being off of oxygen. He says his home life is great and couldnt be any better.    Expected Outcomes  Short: Hear back about going back to work.  Long: Continue to maintain his positive attitude.   [name] will meet with the dietician about his appetite issues and will continue to try to reach his Cardiologist as well.  [name] will continue to exercise to increase his stamina and energy levels.  Staff will continue to follow with [name].  [name] will meet with the dietician today to help with his appetite concerns.  He will also continue to exercise consistently to achieve his stated goals.    Short: maintain exercise at home to keep stress to a minimum. Long: Exercise independently to keep stress at a minimum.    Interventions  -  Stress management education  -  Encouraged to attend Pulmonary Rehabilitation for the exercise    Continue Psychosocial Services   -  Follow up required by staff  Follow up required by staff  Follow up required by staff    Comments  [name] still would like to go back to work if they let him.  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   -  -  -      Initial Review   Source of Stress Concerns  Occupation  -  -  -       Psychosocial Discharge (Final Psychosocial Re-Evaluation): Psychosocial Re-Evaluation - 03/10/17 1043      Psychosocial Re-Evaluation   Current issues with  None Identified    Comments  [name] states his stress has gone down dramatically being off of oxygen. He says his home life is  great and couldnt be any better.    Expected Outcomes  Short: maintain exercise at home to keep stress to a minimum. Long: Exercise independently to keep stress at a minimum.    Interventions  Encouraged to attend Pulmonary Rehabilitation for the exercise    Continue Psychosocial Services   Follow up required by staff       Education: Education Goals: Education classes will be provided on a weekly basis, covering required topics. Participant will state understanding/return demonstration of topics presented.  Learning Barriers/Preferences:   Education Topics:  Initial Evaluation Education: - Verbal, written and demonstration of respiratory meds, oximetry and breathing techniques. Instruction on use of nebulizers and MDIs and importance of monitoring MDI activations.   General Nutrition Guidelines/Fats and Fiber: -Group instruction provided by verbal, written material, models and posters to present the general guidelines for heart healthy nutrition. Gives an explanation and review of dietary fats and fiber.   Pulmonary Rehab from 03/10/2017 in [facility] Cardiac and Pulmonary Rehab  Date  02/01/17  Educator  CR  Instruction Review Code  1- Verbalizes Understanding      Controlling Sodium/Reading Food Labels: -Group verbal and written material supporting the discussion of sodium use in heart healthy nutrition. Review and explanation with models, verbal and written materials for utilization of the food label.   Pulmonary Rehab from 03/10/2017 in [facility] Cardiac and Pulmonary Rehab  Date  02/08/17  Educator  PI  Instruction Review Code  1- Verbalizes Understanding      Exercise Physiology & General Exercise Guidelines: - Group verbal and written instruction with models  to review the exercise physiology of the cardiovascular system and associated critical values. Provides general exercise guidelines with specific guidelines to those with heart or lung disease.    Pulmonary Rehab from 03/10/2017 in  [facility] Cardiac and Pulmonary Rehab  Date  12/30/16  Educator  [facility]  Instruction Review Code  1- Verbalizes Understanding      Aerobic Exercise & Resistance Training: - Gives group verbal and written instruction on the various components of exercise. Focuses on aerobic and resistive training programs and the benefits of this training and how to safely progress through these programs.   Pulmonary Rehab from 03/10/2017 in [facility] Cardiac and Pulmonary Rehab  Date  01/06/17  Educator  [facility]  Instruction Review Code  1- Verbalizes Understanding      Flexibility, Balance, Mind/Body Relaxation: Provides group verbal/written instruction on the benefits of flexibility and balance training, including mind/body exercise modes such as yoga, pilates and tai chi.  Demonstration and skill practice provided.   Pulmonary Rehab from 03/10/2017 in [facility] Cardiac and Pulmonary Rehab  Date  01/11/17  Educator  [facility]  Instruction Review Code  1- Verbalizes Understanding      Stress and Anxiety: - Provides group verbal and written instruction about the health risks of elevated stress and causes of high stress.  Discuss the correlation between heart/lung disease and anxiety and treatment options. Review healthy ways to manage with stress and anxiety.   Pulmonary Rehab from 03/10/2017 in [facility] Cardiac and Pulmonary Rehab  Date  03/10/17  Educator  [facility]  Instruction Review Code  5- Refused Teaching [had class in cardiac]      Depression: - Provides group verbal and written instruction on the correlation between heart/lung disease and depressed mood, treatment options, and the stigmas associated with seeking treatment.   Pulmonary Rehab from 03/10/2017 in [facility] Cardiac and Pulmonary Rehab  Date  02/24/17  Educator  [facility]  Instruction Review Code  1- Verbalizes Understanding      Exercise & Equipment Safety: - Individual verbal instruction and demonstration of equipment use and safety with use of the equipment.   Pulmonary Rehab  from 03/10/2017 in [facility] Cardiac and Pulmonary Rehab  Date  11/30/16  Educator  [place]  Instruction Review Code  1- Verbalizes Understanding      Infection Prevention: - Provides verbal and written material to individual with discussion of infection control including proper hand washing and proper equipment cleaning during exercise session.   Pulmonary Rehab from 03/10/2017 in [facility] Cardiac and Pulmonary Rehab  Date  11/30/16  Educator  [place]  Instruction Review Code  1- Verbalizes Understanding      Falls Prevention: - Provides verbal and written material to individual with discussion of falls prevention and safety.   Pulmonary Rehab from 03/10/2017 in [facility] Cardiac and Pulmonary Rehab  Date  11/30/16  Educator  [place]  Instruction Review Code  1- Verbalizes Understanding      Diabetes: - Individual verbal and written instruction to review signs/symptoms of diabetes, desired ranges of glucose level fasting, after meals and with exercise. Advice that pre and post exercise glucose checks will be done for 3 sessions at entry of program.   Chronic Lung Diseases: - Group verbal and written instruction to review updates, respiratory medications, advancements in procedures and treatments. Discuss use of supplemental oxygen including available portable oxygen systems, continuous and intermittent flow rates, concentrators, personal use and safety guidelines. Review proper use of inhaler and spacers. Provide informative websites for self-education.  Energy Conservation: - Provide group verbal and written instruction for methods to conserve energy, plan and organize activities. Instruct on pacing techniques, use of adaptive equipment and posture/positioning to relieve shortness of breath.   Pulmonary Rehab from 03/10/2017 in [facility] Cardiac and Pulmonary Rehab  Date  03/03/17  Educator  [facility]  Instruction Review Code  1- Verbalizes Understanding      Triggers and Exacerbations: - Group verbal and written  instruction to review types of environmental triggers and ways to prevent exacerbations. Discuss weather changes, air quality and the benefits of nasal washing. Review warning signs and symptoms to help prevent infections. Discuss techniques for effective airway clearance, coughing, and vibrations.   AED/CPR: - Group verbal and written instruction with the use of models to demonstrate the basic use of the AED with the basic ABC's of resuscitation.   Pulmonary Rehab from 03/10/2017 in [facility] Cardiac and Pulmonary Rehab  Date  02/15/17  Educator  SB  Instruction Review Code  1- Actuary and Physiology of the Lungs: - Group verbal and written instruction with the use of models to provide basic lung anatomy and physiology related to function, structure and complications of lung disease.   Anatomy & Physiology of the Heart: - Group verbal and written instruction and models provide basic cardiac anatomy and physiology, with the coronary electrical and arterial systems. Review of Valvular disease and Heart Failure   Pulmonary Rehab from 03/10/2017 in [facility] Cardiac and Pulmonary Rehab  Date  01/25/17  Educator  [facility]  Instruction Review Code  1- Verbalizes Understanding      Cardiac Medications: - Group verbal and written instruction to review commonly prescribed medications for heart disease. Reviews the medication, class of the drug, and side effects.   Pulmonary Rehab from 03/10/2017 in [facility] Cardiac and Pulmonary Rehab  Date  01/18/17  Educator  [place]  Instruction Review Code  1- Verbalizes Understanding      Know Your Numbers and Risk Factors: -Group verbal and written instruction about important numbers in your health.  Discussion of what are risk factors and how they play a role in the disease process.  Review of Cholesterol, Blood Pressure, Diabetes, and BMI and the role they play in your overall health.   Sleep Hygiene: -Provides group verbal and written  instruction about how sleep can affect your health.  Define sleep hygiene, discuss sleep cycles and impact of sleep habits. Review good sleep hygiene tips.    Other: -Provides group and verbal instruction on various topics (see comments)    Knowledge Questionnaire Score: Knowledge Questionnaire Score - 02/15/17 1514      Knowledge Questionnaire Score   Pre Score  17/18 Pulmonary         Core Components/Risk Factors/Patient Goals at Admission:   Core Components/Risk Factors/Patient Goals Review:  Goals and Risk Factor Review    Row Name 01/22/17 0805 03/10/17 1034           Core Components/Risk Factors/Patient Goals Review   Personal Goals Review  Weight Management/Obesity;Heart Failure;Hypertension;Lipids;Improve shortness of breath with ADL's  Weight Management/Obesity;Heart Failure;Hypertension;Lipids;Improve shortness of breath with ADL's      Review  [name] continues to do well in rehab.  He is down to 219 Lbs today.  His appetite has not been good and he feels that he is not eating enough.  We will have him talk with nutrition again.  His breathing is getting better and he is able to do more  around the house.  He has continues to do better with his heart failure, no swelling, just the shortness of breath.  He continues to watch his sodium intake and weighing daily.  He continues to do well with his blood pressures and check them at home.   [name] has lost some weight and his blood pressure has been in healthy ranges. He feels alot better now that he is off of oxygen. He states he wants to maintain his current weight and if he loses a few pounds that would be  His shortness of breath has imprved at home. [place] [name] wants to graduate the program early due to driving a long distance. He wants to continue until the end of the month.      Expected Outcomes  Short: Continue to work on weight loss and improving his SOB.  Long: Continue to  work on risk factors.   Short: graduate [place]. Long: Maintain a home exercise regimen.         Core Components/Risk Factors/Patient Goals at Discharge (Final Review):  Goals and Risk Factor Review - 03/10/17 1034      Core Components/Risk Factors/Patient Goals Review   Personal Goals Review  Weight Management/Obesity;Heart Failure;Hypertension;Lipids;Improve shortness of breath with ADL's    Review  [name] has lost some weight and his blood pressure has been in healthy ranges. He feels alot better now that he is off of oxygen. He states he wants to maintain his current weight and if he loses a few pounds that would be  His shortness of breath has imprved at home. [place] [name] wants to graduate the program early due to driving a long distance. He wants to continue until the end of the month.    Expected Outcomes  Short: graduate [place]. Long: Maintain a home exercise regimen.       ITP Comments: ITP Comments    Row Name 01/18/17 [id number] 01/25/17 0934 01/26/17 1428 02/01/17 0855 02/03/17 0558   ITP Comments  [name] had several PVC runs, which is consistant with his rhythm history, but today there seemed to be more than usual. He stated that he felt fine and took all his meds. RN will send today's rhythm strips to his cardiologist.   [name] mentioned that he was not feeling well today.  He couldn't really pinpoint anything in particular, just not feeling well.  He sat through class without at problem.  [name] was able to exercise today without a problem, but he just stayed on the NuStep today for the whole time.   [facility] in [facility] discharge planner called that [place] was NOT going to have home health but that he was being sent home on home supplemental oxgyen for recent history of pneumonia. . I suggested that she ask the MD to have [facility] complete Cardiac Rehab since it is basically the same staff in the same gym.   [name] was cleared to return  to rehab after his hospitalization.  Documentation found in discharge summary from [facility] visit from ED 01/25/17 in Care Everywhere.  He is now on oxygen therapy for rest and exercise.  Oxygen saturations stayed above 90% during exercise today.   30 Day review. Continue with ITP unless directed changes per Medical Director review.    [place] Name 02/15/17 0807 02/22/17 1538 03/01/17 1112 03/10/17 1017 03/15/17 0824   ITP Comments  Discharge ITP sent and signed by Dr. Sabra Heck.  Discharge Summary routed to PCP and cardiologist.  Pt started in Pulmonary Rehab today.  Initial ITP created and sent to sign by Dr. Sabra Heck, Medical Director.  Documentation for diagnosis can be found in [place] encounter from 02/09/17 from [facility].  Patient is going to use 1 liter of oxygen for exercise.  [name] is off his oxygen per doctors orders.  30 day review completed. ITP sent to Dr. Emily Filbert Director of [place]. Continue with ITP unless changes are made by physician.      Comments: 30 day review

## 2017-03-15 NOTE — Progress Notes (Signed)
Daily Session Note  Patient Details  Name: Jacob Avila MRN: [id number] Date of Birth: [birth year] Referring Provider:     Cardiac Rehab from 02/15/2017 in [facility] Cardiac and Pulmonary Rehab  Referring Provider  Deon Pilling      Encounter Date: 03/15/2017  Check In: Session Check In - 03/15/17 1010      Check-In   Location  [facility]-Cardiac & Pulmonary Rehab    Staff Present  Nada Maclachlan, BA, ACSM CEP, Exercise Physiologist;Kelly Amedeo Plenty, BS, ACSM CEP, Exercise Physiologist;Lunette Tapp Flavia Shipper    Supervising physician immediately available to respond to emergencies  [facility] immediately available ER MD    Physician(s)  Dr. Corky Downs and Jimmye Norman    Medication changes reported      No    Fall or balance concerns reported     No    Tobacco Cessation  No Change    Warm-up and Cool-down  Performed as group-led instruction    Resistance Training Performed  Yes    VAD Patient?  No      Pain Assessment   Currently in Pain?  No/denies          Social History   Tobacco Use  Smoking Status Former Smoker  . Types: Cigarettes  . Last attempt to quit: 01/05/1978  . Years since quitting: 39.2  Smokeless Tobacco Never Used    Goals Met:  Independence with exercise equipment Exercise tolerated well No report of cardiac concerns or symptoms Strength training completed today  Goals Unmet:  Not Applicable  Comments: Pt able to follow exercise prescription today without complaint.  Will continue to monitor for progression.   Dr. Emily Filbert is Medical Director for [place] and [facility] Pulmonary Rehabilitation.

## 2017-03-17 ENCOUNTER — Encounter: Payer: Medicare Other | Admitting: *Deleted

## 2017-03-17 DIAGNOSIS — J849 Interstitial pulmonary disease, unspecified: Secondary | ICD-10-CM

## 2017-03-17 DIAGNOSIS — Z955 Presence of coronary angioplasty implant and graft: Secondary | ICD-10-CM | POA: Diagnosis not present

## 2017-03-17 NOTE — Progress Notes (Signed)
Daily Session Note  Patient Details  Name: Jacob Avila MRN: [id number] Date of Birth: February 02, 1927 Referring Provider:     Cardiac Rehab from 02/15/2017 in [facility] Cardiac and Pulmonary Rehab  Referring Provider  Deon Pilling      Encounter Date: 03/17/2017  Check In: Session Check In - 03/17/17 1003      Check-In   Location  [facility]-Cardiac & Pulmonary Rehab    Staff Present  Alberteen Sam, MA, RCEP, CCRP, Exercise Physiologist;Amanda Oletta Darter, BA, ACSM CEP, Exercise Physiologist;Joseph Flavia Shipper    Supervising physician immediately available to respond to emergencies  [facility] immediately available ER MD    Physician(s)  Drs. Lord and [employer]    Medication changes reported      No    Fall or balance concerns reported     No    Warm-up and Cool-down  Performed as group-led Higher education careers adviser Performed  Yes    VAD Patient?  No      Pain Assessment   Currently in Pain?  No/denies          Social History   Tobacco Use  Smoking Status Former Smoker  . Types: Cigarettes  . Last attempt to quit: 01/05/1978  . Years since quitting: 39.2  Smokeless Tobacco Never Used    Goals Met:  Proper associated with RPD/PD & O2 Sat Independence with exercise equipment Using PLB without cueing & demonstrates good technique Exercise tolerated well No report of cardiac concerns or symptoms Strength training completed today  Goals Unmet:  Not Applicable  Comments: Pt able to follow exercise prescription today without complaint.  Will continue to monitor for progression.    Dr. Emily Filbert is Medical Director for [place] and [facility] Pulmonary Rehabilitation.

## 2017-03-22 DIAGNOSIS — J849 Interstitial pulmonary disease, unspecified: Secondary | ICD-10-CM

## 2017-03-22 DIAGNOSIS — Z955 Presence of coronary angioplasty implant and graft: Secondary | ICD-10-CM | POA: Diagnosis not present

## 2017-03-22 NOTE — Progress Notes (Signed)
Daily Session Note  Patient Details  Name: HRIDAY STAI MRN: [id number] Date of Birth: 04-05-1927 Referring Provider:     Cardiac Rehab from 02/15/2017 in [facility]  Referring Provider  Deon Pilling      Encounter Date: 03/22/2017  Check In: Session Check In - 03/22/17 0944      Check-In   Location  [facility] Re[facility]    Staff Present  Earlean Shawl, BS, ACSM CEP, Exercise Physiologist;Amanda Oletta Darter, BA, ACSM CEP, Exercise Physiologist;Joseph Flavia Shipper    Supervising physician immediately available to respond to emergencies  [facility] immediately available ER MD    Physician(s)  Dr. Burlene Arnt and Corky Downs    Medication changes reported      No    Fall or balance concerns reported     No    Tobacco Cessation  No Change    Warm-up and Cool-down  Performed as group-led instruction    Resistance Training Performed  Yes    VAD Patient?  No      Pain Assessment   Currently in Pain?  No/denies          Social History   Tobacco Use  Smoking Status Former Smoker  . Types: Cigarettes  . Last attempt to quit: 01/05/1978  . Years since quitting: 39.2  Smokeless Tobacco Never Used    Goals Met:  Independence with exercise equipment Exercise tolerated well No report of cardiac concerns or symptoms Strength training completed today  Goals Unmet:  Not Applicable  Comments: Pt able to follow exercise prescription today without complaint.  Will continue to monitor for progression.   Dr. Emily Filbert is Medical Director for [place] and [facility].

## 2017-03-24 VITALS — Ht 72.0 in | Wt 221.0 lb

## 2017-03-24 DIAGNOSIS — Z955 Presence of coronary angioplasty implant and graft: Secondary | ICD-10-CM | POA: Diagnosis not present

## 2017-03-24 DIAGNOSIS — J849 Interstitial pulmonary disease, unspecified: Secondary | ICD-10-CM

## 2017-03-24 NOTE — Progress Notes (Signed)
Daily Session Note  Patient Details  Name: Jacob Avila MRN: [id number] Date of Birth: 07-06-1927 Referring Provider:     Cardiac Rehab from 02/15/2017 in [facility]  Referring Provider  Deon Pilling      Encounter Date: 03/24/2017  Check In: Session Check In - 03/24/17 0950      Check-In   Location  [facility]    Staff Present  Justin Mend RCP,RRT,BSRT;Amanda Oletta Darter, BA, ACSM CEP, Exercise Physiologist;Jessica Luan Pulling, MA, RCEP, CCRP, Exercise Physiologist    Supervising physician immediately available to respond to emergencies  [facility] immediately available ER MD    Physician(s)  Dr. Joni Fears and Jimmye Norman    Medication changes reported      No    Fall or balance concerns reported     No    Tobacco Cessation  No Change    Warm-up and Cool-down  Performed as group-led instruction    Resistance Training Performed  Yes    VAD Patient?  No      Pain Assessment   Currently in Pain?  No/denies          Social History   Tobacco Use  Smoking Status Former Smoker  . Types: Cigarettes  . Last attempt to quit: 01/05/1978  . Years since quitting: 39.2  Smokeless Tobacco Never Used    Goals Met:  Independence with exercise equipment Exercise tolerated well No report of cardiac concerns or symptoms Strength training completed today  Goals Unmet:  Not Applicable  Comments:  6 Minute Walk    Row Name 11/30/16 1452 02/03/17 0905 02/05/17 0923     6 Minute Walk   Phase  Initial  Discharge  Discharge   Distance  1265 feet  1160 feet now wearing O2 continuously  1530 feet   Distance % Change  -  -8.3 %  21 %   Distance Feet Change  -  -105 ft  265 ft   Walk Time  6 minutes  6 minutes  6 minutes   # of Rest Breaks  0  0  0   MPH  2.4  2.2  2.89   METS  2.74  2.42  3.43   RPE  _0 Perceived Dyspnea   1  1  -   VO2 Peak  9.6  8.46  12   Symptoms  Yes (comment)  Yes (comment)  -   Comments  SOB  SOB  -   Resting  HR  109 bpm  94 bpm  -   Resting BP  116/64  106/60  -   Resting Oxygen Saturation   99 %  95 %  -   Exercise Oxygen Saturation  during 6 min walk  97 %  92 %  -   Max Ex. HR  152 bpm  150 bpm  172 bpm   Max Ex. BP  160/74  146/70  152/84   2 Minute Post BP  124/64  -  -     Interval HR   1 Minute HR  -  126  -   2 Minute HR  -  137  -   3 Minute HR  -  130  -   4 Minute HR  -  148  -   5 Minute HR  -  143  -   6 Minute HR  -  146  -   Interval Heart Rate?  -  Yes  -     Interval Oxygen   Interval Oxygen?  -  Yes  -   Baseline Oxygen Saturation %  -  95 %  -   1 Minute Oxygen Saturation %  -  96 %  -   1 Minute Liters of Oxygen  -  2 L  -   2 Minute Oxygen Saturation %  -  96 %  -   2 Minute Liters of Oxygen  -  2 L  -   3 Minute Oxygen Saturation %  -  93 %  -   3 Minute Liters of Oxygen  -  2 L  -   4 Minute Oxygen Saturation %  -  97 %  -   4 Minute Liters of Oxygen  -  2 L  -   5 Minute Oxygen Saturation %  -  92 %  -   5 Minute Liters of Oxygen  -  2 L  -   6 Minute Oxygen Saturation %  -  94 %  -   6 Minute Liters of Oxygen  -  2 L  -   Row Name 02/18/17 1202 03/24/17 1048       6 Minute Walk   Phase  Initial using cardiac post 6MWT  Discharge    Distance  1530 feet  1566 feet    Walk Time  6 minutes  6 minutes    # of Rest Breaks  0  0    MPH  2.89  2.96    METS  3.43  2.83    RPE  15  15    Perceived Dyspnea   1  1    VO2 Peak  12  9.93    Symptoms  Yes (comment)  No    Comments  SOB  -    Resting HR  94 bpm  101 bpm    Resting BP  -  112/60    Resting Oxygen Saturation   95 %  96 %    Exercise Oxygen Saturation  during 6 min walk  92 %  92 %    Max Ex. HR  172 bpm  142 bpm    Max Ex. BP  152/84  138/68     Pt able to follow exercise prescription today without complaint.  Will continue to monitor for progression.   Dr. Emily Filbert is Medical Director for [place] and [facility].

## 2017-03-26 ENCOUNTER — Encounter: Payer: Medicare Other | Admitting: *Deleted

## 2017-03-26 DIAGNOSIS — Z955 Presence of coronary angioplasty implant and graft: Secondary | ICD-10-CM | POA: Diagnosis not present

## 2017-03-26 DIAGNOSIS — J849 Interstitial pulmonary disease, unspecified: Secondary | ICD-10-CM

## 2017-03-26 NOTE — Progress Notes (Signed)
Discharge Progress Report  Patient Details  Name: Jacob Avila MRN: [id number] Date of Birth: Apr 12, 1927 Referring Provider:     Cardiac Rehab from 02/15/2017 in [facility] Cardiac and Pulmonary Rehab  Referring Provider  Deon Pilling       Number of Visits: 13/36  Reason for Discharge:  Patient reached a stable level of exercise. Patient independent in their exercise. Patient has met program and personal goals. Early Exit:  [name] is off of his oxygen and is ready to be discharged  Smoking History:  Social History   Tobacco Use  Smoking Status Former Smoker  . Types: Cigarettes  . Last attempt to quit: 01/05/1978  . Years since quitting: 39.2  Smokeless Tobacco Never Used    Diagnosis:  ILD (interstitial lung disease) ([place])  ADL [facility]: Pulmonary Assessment Scores    Row Name 02/15/17 1514 02/22/17 1147 03/26/17 1022     ADL [facility]   ADL Phase  Entry  Entry  Exit   SOB Score total  80  -  36   Rest  2  -  1   Walk  3  -  1   Stairs  5  -  1   Bath  2  -  2   Dress  3  -  1   Shop  4  -  2     CAT Score   CAT Score  28  -  10     mMRC Score   mMRC Score  -  1  1      Initial Exercise Prescription: Initial Exercise Prescription - 02/18/17 1200      Date of Initial Exercise RX and Referring Provider   Date  02/18/17 Starting Pulmonary on 2/18    Referring Provider  Deon Pilling      Oxygen   Oxygen  Continuous    Liters  2      Recumbant Bike   Level  4    RPM  30    Watts  20    Minutes  15    METs  2.79      NuStep   Level  6    SPM  80    Minutes  15    METs  3.5      Track   Laps  30    Minutes  15    METs  2.38      Prescription Details   Frequency (times per week)  3    Duration  Progress to 45 minutes of aerobic exercise without signs/symptoms of physical distress      Intensity   THRR 40-80% of Max Heartrate  110-125    Ratings of Perceived Exertion  11-13    Perceived Dyspnea  0-4      Progression   Progression  Continue to  progress workloads to maintain intensity without signs/symptoms of physical distress.      Resistance Training   Training Prescription  Yes    Weight  4 lbs    Reps  10-15       Discharge Exercise Prescription (Final Exercise Prescription Changes): Exercise Prescription Changes - 03/17/17 1200      Response to Exercise   Blood Pressure (Admit)  128/64    Heart Rate (Admit)  102 bpm    Heart Rate (Exercise)  132 bpm    Heart Rate (Exit)  86 bpm    Rating of Perceived Exertion (Exercise)  11  Perceived Dyspnea (Exercise)  1    Symptoms  none    Duration  Continue with 45 min of aerobic exercise without signs/symptoms of physical distress.    Intensity  THRR unchanged      Progression   Progression  Continue to progress workloads to maintain intensity without signs/symptoms of physical distress.    Average METs  2.95      Resistance Training   Training Prescription  Yes    Weight  4 lb    Reps  10-15      Interval Training   Interval Training  No      Recumbant Bike   Level  6    RPM  42    Watts  20    Minutes  15    METs  2.6      Track   Laps  57    Minutes  15    METs  3.3       Functional Capacity: 6 Minute Walk    Row Name 11/30/16 1452 02/03/17 0905 02/05/17 0923     6 Minute Walk   Phase  Initial  Discharge  Discharge   Distance  1265 feet  1160 feet now wearing O2 continuously  1530 feet   Distance % Change  -  -8.3 %  21 %   Distance Feet Change  -  -105 ft  265 ft   Walk Time  6 minutes  6 minutes  6 minutes   # of Rest Breaks  0  0  0   MPH  2.4  2.2  2.89   METS  2.74  2.42  3.43   RPE  13  15  15    Perceived Dyspnea   1  1  -   VO2 Peak  9.6  8.46  12   Symptoms  Yes (comment)  Yes (comment)  -   Comments  SOB  SOB  -   Resting HR  109 bpm  94 bpm  -   Resting BP  116/64  106/60  -   Resting Oxygen Saturation   99 %  95 %  -   Exercise Oxygen Saturation  during 6 min walk  97 %  92 %  -   Max Ex. HR  152 bpm  150 bpm  172 bpm   Max  Ex. BP  160/74  146/70  152/84   2 Minute Post BP  124/64  -  -     Interval HR   1 Minute HR  -  126  -   2 Minute HR  -  137  -   3 Minute HR  -  130  -   4 Minute HR  -  148  -   5 Minute HR  -  143  -   6 Minute HR  -  146  -   Interval Heart Rate?  -  Yes  -     Interval Oxygen   Interval Oxygen?  -  Yes  -   Baseline Oxygen Saturation %  -  95 %  -   1 Minute Oxygen Saturation %  -  96 %  -   1 Minute Liters of Oxygen  -  2 L  -   2 Minute Oxygen Saturation %  -  96 %  -   2 Minute Liters of Oxygen  -  2 L  -   3 Minute Oxygen  Saturation %  -  93 %  -   3 Minute Liters of Oxygen  -  2 L  -   4 Minute Oxygen Saturation %  -  97 %  -   4 Minute Liters of Oxygen  -  2 L  -   5 Minute Oxygen Saturation %  -  92 %  -   5 Minute Liters of Oxygen  -  2 L  -   6 Minute Oxygen Saturation %  -  94 %  -   6 Minute Liters of Oxygen  -  2 L  -   Row Name 02/18/17 1202 03/24/17 1048       6 Minute Walk   Phase  Initial using cardiac post 6MWT  Discharge    Distance  1530 feet  1566 feet    Walk Time  6 minutes  6 minutes    # of Rest Breaks  0  0    MPH  2.89  2.96    METS  3.43  2.83    RPE  15  15    Perceived Dyspnea   1  1    VO2 Peak  12  9.93    Symptoms  Yes (comment)  No    Comments  SOB  -    Resting HR  94 bpm  101 bpm    Resting BP  -  112/60    Resting Oxygen Saturation   95 %  96 %    Exercise Oxygen Saturation  during 6 min walk  92 %  92 %    Max Ex. HR  172 bpm  142 bpm    Max Ex. BP  152/84  138/68       Psychological, QOL, Others - Outcomes: PHQ 2/9: Depression screen [facility] 2/9 03/26/2017 02/15/2017 02/15/2017 01/25/2017 12/07/2016  Decreased Interest 0 2 - 2 2  Down, Depressed, Hopeless 0 2 - 1 1  PHQ - 2 Score 0 4 - 3 3  Altered sleeping 0 1 1 1  0  Tired, decreased energy 0 3 - 2 2  Change in appetite 0 0 - 3 3  Feeling bad or failure about yourself  0 0 - 1 0  Trouble concentrating 0 0 - 0 1  Moving slowly or fidgety/restless 0 0 - 0 1  Suicidal  thoughts 0 0 - 1 0  PHQ-9 Score 0 8 - 11 10  Difficult doing work/chores Not difficult at all - - Somewhat difficult Somewhat difficult    Quality of Life: Quality of Life - 02/15/17 1511      Quality of Life Scores   Health/Function Pre  27.17 %    Health/Function Post  24.4 %    Health/Function % Change  -10.2 %    Socioeconomic Pre  30 %    Socioeconomic Post  27.75 %    Socioeconomic % Change   -7.5 %    Psych/Spiritual Pre  30 %    Psych/Spiritual Post  30 %    Psych/Spiritual % Change  0 %    Family Pre  30 %    Family Post  30 %    Family % Change  0 %    GLOBAL Pre  28.78 %    GLOBAL Post  27.09 %    GLOBAL % Change  -5.87 %       Personal Goals: Goals established at orientation with interventions provided to work toward  goal. Personal Goals and Risk Factors at Admission - 11/30/16 1406      Core Components/Risk Factors/Patient Goals on Admission    Weight Management  Yes;Weight Loss    Intervention  Weight Management: Develop a combined nutrition and exercise program designed to reach desired caloric intake, while maintaining appropriate intake of nutrient and fiber, sodium and fats, and appropriate energy expenditure required for the weight goal.;Weight Management: Provide education and appropriate resources to help participant work on and attain dietary goals. patient states he has lost weight recently without trying due to no appetite and not eating much.     Admit Weight  221 lb (100.2 kg)    Goal Weight: Short Term  215 lb (97.5 kg)    Goal Weight: Long Term  211 lb (95.7 kg)    Expected Outcomes  Long Term: Adherence to nutrition and physical activity/exercise program aimed toward attainment of established weight goal;Understanding recommendations for meals to include 15-35% energy as protein, 25-35% energy from fat, 35-60% energy from carbohydrates, less than 294m of dietary cholesterol, 20-35 gm of total fiber daily;Understanding of distribution of calorie  intake throughout the day with the consumption of 4-5 meals/snacks;Short Term: Continue to assess and modify interventions until short term weight is achieved;Weight Loss: Understanding of general recommendations for a balanced deficit meal plan, which promotes 1-2 lb weight loss per week and includes a negative energy balance of [phone] kcal/d    Improve shortness of breath with ADL's  Yes    Intervention  Provide education, individualized exercise plan and daily activity instruction to help decrease symptoms of SOB with activities of daily living.    Expected Outcomes  Short Term: Achieves a reduction of symptoms when performing activities of daily living.    Develop more efficient breathing techniques such as purse lipped breathing and diaphragmatic breathing; and practicing self-pacing with activity  Yes    Intervention  Provide education, demonstration and support about specific breathing techniuqes utilized for more efficient breathing. Include techniques such as pursed lipped breathing, diaphragmatic breathing and self-pacing activity.    Expected Outcomes  Short Term: Participant will be able to demonstrate and use breathing techniques as needed throughout daily activities.    Heart Failure  Yes    Intervention  Provide a combined exercise and nutrition program that is supplemented with education, support and counseling about heart failure. Directed toward relieving symptoms such as shortness of breath, decreased exercise tolerance, and extremity edema.    Expected Outcomes  Improve functional capacity of life;Short term: Attendance in program 2-3 days a week with increased exercise capacity. Reported lower sodium intake. Reported increased fruit and vegetable intake. Reports medication compliance.;Short term: Daily weights obtained and reported for increase. Utilizing diuretic protocols set by physician.;Long term: Adoption of self-care skills and reduction of barriers for early signs and symptoms  recognition and intervention leading to self-care maintenance.    Hypertension  Yes    Intervention  Provide education on lifestyle modifcations including regular physical activity/exercise, weight management, moderate sodium restriction and increased consumption of fresh fruit, vegetables, and low fat dairy, alcohol moderation, and smoking cessation.;Monitor prescription use compliance.    Expected Outcomes  Short Term: Continued assessment and intervention until BP is < 140/916mHG in hypertensive participants. < 130/8047mG in hypertensive participants with diabetes, heart failure or chronic kidney disease.;Long Term: Maintenance of blood pressure at goal levels.    Lipids  Yes    Intervention  Provide education and support for participant on nutrition &  aerobic/resistive exercise along with prescribed medications to achieve LDL <74m, HDL >429m    Expected Outcomes  Short Term: Participant states understanding of desired cholesterol values and is compliant with medications prescribed. Participant is following exercise prescription and nutrition guidelines.;Long Term: Cholesterol controlled with medications as prescribed, with individualized exercise RX and with personalized nutrition plan. Value goals: LDL < 7097mHDL > 40 mg.    Stress  Yes    Intervention  Offer individual and/or small group education and counseling on adjustment to heart disease, stress management and health-related lifestyle change. Teach and support self-help strategies.;Refer participants experiencing significant psychosocial distress to appropriate mental health specialists for further evaluation and treatment. When possible, include family members and significant others in education/counseling sessions.    Expected Outcomes  Short Term: Participant demonstrates changes in health-related behavior, relaxation and other stress management skills, ability to obtain effective social support, and compliance with psychotropic  medications if prescribed.;Long Term: Emotional wellbeing is indicated by absence of clinically significant psychosocial distress or social isolation.        Personal Goals Discharge: Goals and Risk Factor Review    Row Name 12/16/16 0800 01/22/17 0805 03/10/17 1034         Core Components/Risk Factors/Patient Goals Review   Personal Goals Review  Weight Management/Obesity;Heart Failure;Hypertension;Lipids;Improve shortness of breath with ADL's  Weight Management/Obesity;Heart Failure;Hypertension;Lipids;Improve shortness of breath with ADL's  Weight Management/Obesity;Heart Failure;Hypertension;Lipids;Improve shortness of breath with ADL's     Review  RaeDwyane Luo off to a good start.  His weight was up 1/2 lbs today as he was eating during the snow days.  His breathing is already starting to get better.  He has not had symptoms of his heart failure.  He weighs daily and watches his sodium intake.  His  blood pressures have been good and he checks them at home on occasion.  He has not had any problems with his medications.   RaeDwyane Luontinues to do well in rehab.  He is down to 219 Lbs today.  His appetite has not been good and he feels that he is not eating enough.  We will have him talk with nutrition again.  His breathing is getting better and he is able to do more around the house.  He has continues to do better with his heart failure, no swelling, just the shortness of breath.  He continues to watch his sodium intake and weighing daily.  He continues to do well with his blood pressures and check them at home.   RaeDwyane Luos lost some weight and his blood pressure has been in healthy ranges. He feels alot better now that he is off of oxygen. He states he wants to maintain his current weight and if he loses a few pounds that would be  His shortness of breath has imprved at home. [place] RaeDwyane Luonts to graduate the program early due to driving a long distance. He  wants to continue until the end of the month.     Expected Outcomes  Short: Continue to work on weight loss and check blood pressures a little more frequently.  Long: Continue to work risk factor modifications.   Short: Continue to work on weight loss and improving his SOB.  Long: Continue to work on risk factors.   Short: graduate [place]: Maintain a home exercise regimen.        Exercise Goals and Review: Exercise Goals  [place] Name 11/30/16 1503             Exercise Goals   Increase Physical Activity  Yes       Intervention  Provide advice, education, support and counseling about physical activity/exercise needs.;Develop an individualized exercise prescription for aerobic and resistive training based on initial evaluation findings, risk stratification, comorbidities and participant's personal goals.       Expected Outcomes  Achievement of increased cardiorespiratory fitness and enhanced flexibility, muscular endurance and strength shown through measurements of functional capacity and personal statement of participant.       Increase Strength and Stamina  Yes       Intervention  Provide advice, education, support and counseling about physical activity/exercise needs.;Develop an individualized exercise prescription for aerobic and resistive training based on initial evaluation findings, risk stratification, comorbidities and participant's personal goals.       Expected Outcomes  Achievement of increased cardiorespiratory fitness and enhanced flexibility, muscular endurance and strength shown through measurements of functional capacity and personal statement of participant.       Able to understand and use rate of perceived exertion (RPE) scale  Yes       Intervention  Provide education and explanation on how to use RPE scale       Expected Outcomes  Short Term: Able to use RPE daily in rehab to express subjective intensity level;Long Term:  Able to use RPE to guide intensity level when  exercising independently       Knowledge and understanding of Target Heart Rate Range (THRR)  Yes       Intervention  Provide education and explanation of THRR including how the numbers were predicted and where they are located for reference       Expected Outcomes  Short Term: Able to state/look up THRR;Long Term: Able to use THRR to govern intensity when exercising independently;Short Term: Able to use daily as guideline for intensity in rehab       Able to check pulse independently  Yes       Intervention  Provide education and demonstration on how to check pulse in carotid and radial arteries.;Review the importance of being able to check your own pulse for safety during independent exercise       Expected Outcomes  Short Term: Able to explain why pulse checking is important during independent exercise;Long Term: Able to check pulse independently and accurately       Understanding of Exercise Prescription  Yes       Intervention  Provide education, explanation, and written materials on patient's individual exercise prescription       Expected Outcomes  Short Term: Able to explain program exercise prescription;Long Term: Able to explain home exercise prescription to exercise independently          Nutrition & Weight - Outcomes: Pre Biometrics - 11/30/16 1504      Pre Biometrics   Height  6' 0.5" (1.842 m)    Weight  221 lb (100.2 kg)    Waist Circumference  39 inches    Hip Circumference  43 inches    Waist to Hip Ratio  0.91 %    BMI (Calculated)  29.54    Single Leg Stand  1.65 seconds      Post Biometrics - 03/24/17 1047       Post  Biometrics   Height  6' (1.829 m)    Weight  221 lb (100.2 kg)    Waist Circumference  40  inches    Hip Circumference  45 inches    Waist to Hip Ratio  0.89 %    BMI (Calculated)  29.97       Nutrition: Nutrition Therapy & Goals - 02/03/17 0922      Nutrition Therapy   Diet  TLC    Drug/Food Interactions  Statins/Certain Fruits    Protein  (specify units)  9oz    Fiber  36 grams    Saturated Fats  16 max. grams    Fruits and Vegetables  6 servings/day    Sodium  2000 grams      Personal Nutrition Goals   Nutrition Goal  Choose foods with strong flavors "twang" and aromas to help stimulate appetite    Personal Goal #2  Continue to drink Ensure supplement drinks on a regular basis until appetite improves    Personal Goal #3  Eat a source of protein at each meal and snack      Intervention Plan   Intervention  Prescribe, educate and counsel regarding individualized specific dietary modifications aiming towards targeted core components such as weight, hypertension, lipid management, diabetes, heart failure and other comorbidities.    Expected Outcomes  Short Term Goal: Understand basic principles of dietary content, such as calories, fat, sodium, cholesterol and nutrients.;Short Term Goal: A plan has been developed with personal nutrition goals set during dietitian appointment.;Long Term Goal: Adherence to prescribed nutrition plan.       Nutrition Discharge: Nutrition Assessments - 03/26/17 1024      MEDFICTS Scores   Pre Score  9       Education Questionnaire Score: Knowledge Questionnaire Score - 03/26/17 1024      Knowledge Questionnaire Score   Pre Score  17/18    Post Score  16/18 reviewed with patient       Goals reviewed with patient; copy given to patient.

## 2017-03-26 NOTE — Progress Notes (Signed)
Daily Session Note  Patient Details  Name: Jacob Avila MRN: [id number] Date of Birth: [birth year] Referring Provider:     Cardiac Rehab from 02/15/2017 in [facility] Cardiac and Pulmonary Rehab  Referring Provider  Deon Pilling      Encounter Date: 03/26/2017  Check In: Session Check In - 03/26/17 1014      Check-In   Location  [facility]-Cardiac & Pulmonary Rehab    Staff Present  Renita Papa, RN Vickki Hearing, BA, ACSM CEP, Exercise Physiologist;Joseph Flavia Shipper    Supervising physician immediately available to respond to emergencies  [facility] immediately available ER MD    Physician(s)  Drs. Joni Fears and [place]    Medication changes reported      No    Fall or balance concerns reported     No    Warm-up and Cool-down  Performed as group-led Higher education careers adviser Performed  Yes    VAD Patient?  No      Pain Assessment   Currently in Pain?  No/denies          Social History   Tobacco Use  Smoking Status Former Smoker  . Types: Cigarettes  . Last attempt to quit: 01/05/1978  . Years since quitting: 39.2  Smokeless Tobacco Never Used    Goals Met:  Proper associated with RPD/PD & O2 Sat Independence with exercise equipment Using PLB without cueing & demonstrates good technique Exercise tolerated well Personal goals reviewed No report of cardiac concerns or symptoms Strength training completed today  Goals Unmet:  Not Applicable  Comments:  Jacob Avila graduated today from  rehab with 36 sessions completed.  Details of the patient's exercise prescription and what He needs to do in order to continue the prescription and progress were discussed with patient.  Patient was given a copy of prescription and goals.  Patient verbalized understanding.  Jacob Avila plans to continue to exercise by walking at home.    Dr. Emily Filbert is Medical Director for [place] and [facility] Rehabilitation.

## 2017-03-26 NOTE — Patient Instructions (Signed)
Discharge Patient Instructions  Patient Details  Name: Jacob Avila MRN: [id number] Date of Birth: [birth year] Referring Provider:  Sandria Bales, MD   Number of Visits: 13/36  Reason for Discharge:  Patient reached a stable level of exercise. Patient independent in their exercise. Patient has met program and personal goals. Early Exit:  Jacob Avila is no longer on oxygen and is ready to be discharged  Smoking History:  Social History   Tobacco Use  Smoking Status Former Smoker  . Types: Cigarettes  . Last attempt to quit: 01/05/1978  . Years since quitting: 39.2  Smokeless Tobacco Never Used    Diagnosis:  ILD (interstitial lung disease) ([place])  Initial Exercise Prescription: Initial Exercise Prescription - 02/18/17 1200      Date of Initial Exercise RX and Referring Provider   Date  02/18/17 Starting Pulmonary on 2/18    Referring Provider  Deon Pilling      Oxygen   Oxygen  Continuous    Liters  2      Recumbant Bike   Level  4    RPM  30    Watts  20    Minutes  15    METs  2.79      NuStep   Level  6    SPM  80    Minutes  15    METs  3.5      Track   Laps  30    Minutes  15    METs  2.38      Prescription Details   Frequency (times per week)  3    Duration  Progress to 45 minutes of aerobic exercise without signs/symptoms of physical distress      Intensity   THRR 40-80% of Max Heartrate  110-125    Ratings of Perceived Exertion  11-13    Perceived Dyspnea  0-4      Progression   Progression  Continue to progress workloads to maintain intensity without signs/symptoms of physical distress.      Resistance Training   Training Prescription  Yes    Weight  4 lbs    Reps  10-15       Discharge Exercise Prescription (Final Exercise Prescription Changes): Exercise Prescription Changes - 03/17/17 1200      Response to Exercise   Blood Pressure (Admit)  128/64    Heart Rate (Admit)  102 bpm    Heart Rate (Exercise)  132 bpm    Heart Rate  (Exit)  86 bpm    Rating of Perceived Exertion (Exercise)  11    Perceived Dyspnea (Exercise)  1    Symptoms  none    Duration  Continue with 45 min of aerobic exercise without signs/symptoms of physical distress.    Intensity  THRR unchanged      Progression   Progression  Continue to progress workloads to maintain intensity without signs/symptoms of physical distress.    Average METs  2.95      Resistance Training   Training Prescription  Yes    Weight  4 lb    Reps  10-15      Interval Training   Interval Training  No      Recumbant Bike   Level  6    RPM  42    Watts  20    Minutes  15    METs  2.6      Track   Laps  57  Minutes  15    METs  3.3       Functional Capacity: 6 Minute Walk    Row Name 11/30/16 1452 02/03/17 0905 02/05/17 0923     6 Minute Walk   Phase  Initial  Discharge  Discharge   Distance  1265 feet  1160 feet now wearing O2 continuously  1530 feet   Distance % Change  -  -8.3 %  21 %   Distance Feet Change  -  -105 ft  265 ft   Walk Time  6 minutes  6 minutes  6 minutes   # of Rest Breaks  0  0  0   MPH  2.4  2.2  2.89   METS  2.74  2.42  3.43   RPE  13  15  15    Perceived Dyspnea   1  1  -   VO2 Peak  9.6  8.46  12   Symptoms  Yes (comment)  Yes (comment)  -   Comments  SOB  SOB  -   Resting HR  109 bpm  94 bpm  -   Resting BP  116/64  106/60  -   Resting Oxygen Saturation   99 %  95 %  -   Exercise Oxygen Saturation  during 6 min walk  97 %  92 %  -   Max Ex. HR  152 bpm  150 bpm  172 bpm   Max Ex. BP  160/74  146/70  152/84   2 Minute Post BP  124/64  -  -     Interval HR   1 Minute HR  -  126  -   2 Minute HR  -  137  -   3 Minute HR  -  130  -   4 Minute HR  -  148  -   5 Minute HR  -  143  -   6 Minute HR  -  146  -   Interval Heart Rate?  -  Yes  -     Interval Oxygen   Interval Oxygen?  -  Yes  -   Baseline Oxygen Saturation %  -  95 %  -   1 Minute Oxygen Saturation %  -  96 %  -   1 Minute Liters of Oxygen  -  2  L  -   2 Minute Oxygen Saturation %  -  96 %  -   2 Minute Liters of Oxygen  -  2 L  -   3 Minute Oxygen Saturation %  -  93 %  -   3 Minute Liters of Oxygen  -  2 L  -   4 Minute Oxygen Saturation %  -  97 %  -   4 Minute Liters of Oxygen  -  2 L  -   5 Minute Oxygen Saturation %  -  92 %  -   5 Minute Liters of Oxygen  -  2 L  -   6 Minute Oxygen Saturation %  -  94 %  -   6 Minute Liters of Oxygen  -  2 L  -   Row Name 02/18/17 1202 03/24/17 1048       6 Minute Walk   Phase  Initial using cardiac post 6MWT  Discharge    Distance  1530 feet  1566 feet    Walk Time  6 minutes  6 minutes    # of Rest Breaks  0  0    MPH  2.89  2.96    METS  3.43  2.83    RPE  15  15    Perceived Dyspnea   1  1    VO2 Peak  12  9.93    Symptoms  Yes (comment)  No    Comments  SOB  -    Resting HR  94 bpm  101 bpm    Resting BP  -  112/60    Resting Oxygen Saturation   95 %  96 %    Exercise Oxygen Saturation  during 6 min walk  92 %  92 %    Max Ex. HR  172 bpm  142 bpm    Max Ex. BP  152/84  138/68       Quality of Life: Quality of Life - 02/15/17 1511      Quality of Life Scores   Health/Function Pre  27.17 %    Health/Function Post  24.4 %    Health/Function % Change  -10.2 %    Socioeconomic Pre  30 %    Socioeconomic Post  27.75 %    Socioeconomic % Change   -7.5 %    Psych/Spiritual Pre  30 %    Psych/Spiritual Post  30 %    Psych/Spiritual % Change  0 %    Family Pre  30 %    Family Post  30 %    Family % Change  0 %    GLOBAL Pre  28.78 %    GLOBAL Post  27.09 %    GLOBAL % Change  -5.87 %       Personal Goals: Goals established at orientation with interventions provided to work toward goal. Personal Goals and Risk Factors at Admission - 11/30/16 1406      Core Components/Risk Factors/Patient Goals on Admission    Weight Management  Yes;Weight Loss    Intervention  Weight Management: Develop a combined nutrition and exercise program designed to reach desired  caloric intake, while maintaining appropriate intake of nutrient and fiber, sodium and fats, and appropriate energy expenditure required for the weight goal.;Weight Management: Provide education and appropriate resources to help participant work on and attain dietary goals. patient states he has lost weight recently without trying due to no appetite and not eating much.     Admit Weight  221 lb (100.2 kg)    Goal Weight: Short Term  215 lb (97.5 kg)    Goal Weight: Long Term  211 lb (95.7 kg)    Expected Outcomes  Long Term: Adherence to nutrition and physical activity/exercise program aimed toward attainment of established weight goal;Understanding recommendations for meals to include 15-35% energy as protein, 25-35% energy from fat, 35-60% energy from carbohydrates, less than 268m of dietary cholesterol, 20-35 gm of total fiber daily;Understanding of distribution of calorie intake throughout the day with the consumption of 4-5 meals/snacks;Short Term: Continue to assess and modify interventions until short term weight is achieved;Weight Loss: Understanding of general recommendations for a balanced deficit meal plan, which promotes 1-2 lb weight loss per week and includes a negative energy balance of [id number] kcal/d    Improve shortness of breath with ADL's  Yes    Intervention  Provide education, individualized exercise plan and daily activity instruction to help decrease symptoms of SOB with activities of daily living.    Expected Outcomes  Short Term:  Achieves a reduction of symptoms when performing activities of daily living.    Develop more efficient breathing techniques such as purse lipped breathing and diaphragmatic breathing; and practicing self-pacing with activity  Yes    Intervention  Provide education, demonstration and support about specific breathing techniuqes utilized for more efficient breathing. Include techniques such as pursed lipped breathing, diaphragmatic breathing and self-pacing  activity.    Expected Outcomes  Short Term: Participant will be able to demonstrate and use breathing techniques as needed throughout daily activities.    Heart Failure  Yes    Intervention  Provide a combined exercise and nutrition program that is supplemented with education, support and counseling about heart failure. Directed toward relieving symptoms such as shortness of breath, decreased exercise tolerance, and extremity edema.    Expected Outcomes  Improve functional capacity of life;Short term: Attendance in program 2-3 days a week with increased exercise capacity. Reported lower sodium intake. Reported increased fruit and vegetable intake. Reports medication compliance.;Short term: Daily weights obtained and reported for increase. Utilizing diuretic protocols set by physician.;Long term: Adoption of self-care skills and reduction of barriers for early signs and symptoms recognition and intervention leading to self-care maintenance.    Hypertension  Yes    Intervention  Provide education on lifestyle modifcations including regular physical activity/exercise, weight management, moderate sodium restriction and increased consumption of fresh fruit, vegetables, and low fat dairy, alcohol moderation, and smoking cessation.;Monitor prescription use compliance.    Expected Outcomes  Short Term: Continued assessment and intervention until BP is < 140/44m HG in hypertensive participants. < 130/853mHG in hypertensive participants with diabetes, heart failure or chronic kidney disease.;Long Term: Maintenance of blood pressure at goal levels.    Lipids  Yes    Intervention  Provide education and support for participant on nutrition & aerobic/resistive exercise along with prescribed medications to achieve LDL <7071mHDL >65m55m  Expected Outcomes  Short Term: Participant states understanding of desired cholesterol values and is compliant with medications prescribed. Participant is following exercise  prescription and nutrition guidelines.;Long Term: Cholesterol controlled with medications as prescribed, with individualized exercise RX and with personalized nutrition plan. Value goals: LDL < 70mg50mL > 40 mg.    Stress  Yes    Intervention  Offer individual and/or small group education and counseling on adjustment to heart disease, stress management and health-related lifestyle change. Teach and support self-help strategies.;Refer participants experiencing significant psychosocial distress to appropriate mental health specialists for further evaluation and treatment. When possible, include family members and significant others in education/counseling sessions.    Expected Outcomes  Short Term: Participant demonstrates changes in health-related behavior, relaxation and other stress management skills, ability to obtain effective social support, and compliance with psychotropic medications if prescribed.;Long Term: Emotional wellbeing is indicated by absence of clinically significant psychosocial distress or social isolation.        Personal Goals Discharge: Goals and Risk Factor Review - 03/10/17 1034      Core Components/Risk Factors/Patient Goals Review   Personal Goals Review  Weight Management/Obesity;Heart Failure;Hypertension;Lipids;Improve shortness of breath with ADL's    Review  Rae hDwyane Luolost some weight and his blood pressure has been in healthy ranges. He feels alot better now that he is off of oxygen. He states he wants to maintain his current weight and if he loses a few pounds that would be  His shortness of breath has imprved at home. [place] Jacob Avila wants to graduate the program early due to driving a long distance. He wants to continue until the end of the month.    Expected Outcomes  Short: graduate [facility]. Long: Maintain a home exercise regimen.       Exercise Goals and Review: Exercise Goals    Row Name 11/30/16 1503              Exercise Goals   Increase Physical Activity  Yes       Intervention  Provide advice, education, support and counseling about physical activity/exercise needs.;Develop an individualized exercise prescription for aerobic and resistive training based on initial evaluation findings, risk stratification, comorbidities and participant's personal goals.       Expected Outcomes  Achievement of increased cardiorespiratory fitness and enhanced flexibility, muscular endurance and strength shown through measurements of functional capacity and personal statement of participant.       Increase Strength and Stamina  Yes       Intervention  Provide advice, education, support and counseling about physical activity/exercise needs.;Develop an individualized exercise prescription for aerobic and resistive training based on initial evaluation findings, risk stratification, comorbidities and participant's personal goals.       Expected Outcomes  Achievement of increased cardiorespiratory fitness and enhanced flexibility, muscular endurance and strength shown through measurements of functional capacity and personal statement of participant.       Able to understand and use rate of perceived exertion (RPE) scale  Yes       Intervention  Provide education and explanation on how to use RPE scale       Expected Outcomes  Short Term: Able to use RPE daily in rehab to express subjective intensity level;Long Term:  Able to use RPE to guide intensity level when exercising independently       Knowledge and understanding of Target Heart Rate Range (THRR)  Yes       Intervention  Provide education and explanation of THRR including how the numbers were predicted and where they are located for reference       Expected Outcomes  Short Term: Able to state/look up THRR;Long Term: Able to use THRR to govern intensity when exercising independently;Short Term: Able to use daily as guideline for intensity in rehab       Able to check  pulse independently  Yes       Intervention  Provide education and demonstration on how to check pulse in carotid and radial arteries.;Review the importance of being able to check your own pulse for safety during independent exercise       Expected Outcomes  Short Term: Able to explain why pulse checking is important during independent exercise;Long Term: Able to check pulse independently and accurately       Understanding of Exercise Prescription  Yes       Intervention  Provide education, explanation, and written materials on patient's individual exercise prescription       Expected Outcomes  Short Term: Able to explain program exercise prescription;Long Term: Able to explain home exercise prescription to exercise independently          Nutrition & Weight - Outcomes: Pre Biometrics - 11/30/16 1504      Pre Biometrics   Height  6' 0.5" (1.842 m)    Weight  221 lb (100.2 kg)    Waist Circumference  39 inches    Hip Circumference  43 inches    Waist to Hip Ratio  0.91 %  BMI (Calculated)  29.54    Single Leg Stand  1.65 seconds      Post Biometrics - 03/24/17 1047       Post  Biometrics   Height  6' (1.829 m)    Weight  221 lb (100.2 kg)    Waist Circumference  40 inches    Hip Circumference  45 inches    Waist to Hip Ratio  0.89 %    BMI (Calculated)  29.97       Nutrition: Nutrition Therapy & Goals - 02/03/17 0922      Nutrition Therapy   Diet  TLC    Drug/Food Interactions  Statins/Certain Fruits    Protein (specify units)  9oz    Fiber  36 grams    Saturated Fats  16 max. grams    Fruits and Vegetables  6 servings/day    Sodium  2000 grams      Personal Nutrition Goals   Nutrition Goal  Choose foods with strong flavors "twang" and aromas to help stimulate appetite    Personal Goal #2  Continue to drink Ensure supplement drinks on a regular basis until appetite improves    Personal Goal #3  Eat a source of protein at each meal and snack      Intervention Plan    Intervention  Prescribe, educate and counsel regarding individualized specific dietary modifications aiming towards targeted core components such as weight, hypertension, lipid management, diabetes, heart failure and other comorbidities.    Expected Outcomes  Short Term Goal: Understand basic principles of dietary content, such as calories, fat, sodium, cholesterol and nutrients.;Short Term Goal: A plan has been developed with personal nutrition goals set during dietitian appointment.;Long Term Goal: Adherence to prescribed nutrition plan.       Nutrition Discharge: Nutrition Assessments - 03/26/17 1024      MEDFICTS Scores   Pre Score  9       Education Questionnaire Score: Knowledge Questionnaire Score - 03/26/17 1024      Knowledge Questionnaire Score   Pre Score  17/18    Post Score  16/18 reviewed with patient       Goals reviewed with patient; copy given to patient.

## 2017-03-26 NOTE — Progress Notes (Signed)
Pulmonary Individual Treatment Plan  Patient Details  Name: VALENTINE BARNEY MRN: [id number] Date of Birth: [birth year] Referring Provider:     Cardiac Rehab from 02/15/2017 in [facility] Cardiac [facility] Pulmonary Rehab  Referring Provider  Deon Pilling      Initial Encounter Date:    Cardiac Rehab from 02/15/2017 in [facility] Cardiac and Pulmonary Rehab  Date  02/18/17 [Starting Pulmonary on 2/18]  Referring Provider  Deon Pilling      Visit Diagnosis: ILD (interstitial lung disease) ([place])  Patient's Home Medications on Admission:  Current Outpatient Medications:  .  aspirin EC 81 MG tablet, Take 81 mg by mouth., Disp: , Rfl:  .  atorvastatin (LIPITOR) 80 MG tablet, Take 80 mg by mouth., Disp: , Rfl:  .  clopidogrel (PLAVIX) 75 MG tablet, Take 75 mg by mouth., Disp: , Rfl:  .  furosemide (LASIX) 20 MG tablet, Take 40 mg by mouth., Disp: , Rfl:  .  lisinopril (PRINIVIL,ZESTRIL) 2.5 MG tablet, Take 2.5 mg by mouth., Disp: , Rfl:  .  metoprolol succinate (TOPROL-XL) 50 MG 24 hr tablet, Take 50 mg by mouth., Disp: , Rfl:  .  omeprazole (PRILOSEC) 20 MG capsule, Take 20 mg by mouth., Disp: , Rfl:  .  simvastatin (ZOCOR) 40 MG tablet, Take 40 mg by mouth., Disp: , Rfl:   Past Medical History: Past Medical History:  Diagnosis Date  . 1st degree AV block 05/13/2012  . Arteriosclerosis of coronary artery 11/12/2014  . Atrial fibrillation ([place]) 11/30/2016  . BP (high blood pressure) 05/12/2012  . Bradycardia 05/12/2012  . Dizziness 05/12/2012  . HLD (hyperlipidemia)     Tobacco Use: Social History   Tobacco Use  Smoking Status Former Smoker  . Types: Cigarettes  . Last attempt to quit: 01/05/1978  . Years since quitting: 39.2  Smokeless Tobacco Never Used    Labs: Recent Review Flowsheet Data    There is no flowsheet data to display.       Pulmonary Assessment Scores: Pulmonary Assessment Scores    Row Name 02/15/17 1514 02/22/17 1147 03/26/17 1022     ADL UCSD   ADL Phase  Entry   Entry  Exit   SOB Score total  80  -  36   Rest  2  -  1   Walk  3  -  1   Stairs  5  -  1   Bath  2  -  2   Dress  3  -  1   Shop  4  -  2     CAT Score   CAT Score  28  -  10     mMRC Score   mMRC Score  -  1  1      Pulmonary Function Assessment:   Exercise Target Goals:    Exercise Program Goal: Individual exercise prescription set using results from initial 6 min walk test and THRR while considering  patient's activity barriers and safety.    Exercise Prescription Goal: Initial exercise prescription builds to 30-45 minutes a day of aerobic activity, 2-3 days per week.  Home exercise guidelines will be given to patient during program as part of exercise prescription that the participant will acknowledge.  Activity Barriers & Risk Stratification: Activity Barriers & Cardiac Risk Stratification - 11/30/16 1413      Activity Barriers & Cardiac Risk Stratification   Activity Barriers  Shortness of Breath;Deconditioning;Muscular Weakness;Balance Concerns    Cardiac Risk Stratification  Moderate  6 Minute Walk: 6 Minute Walk    Row Name 11/30/16 1452 02/03/17 0905 02/05/17 0923     6 Minute Walk   Phase  Initial  Discharge  Discharge   Distance  1265 feet  1160 feet now wearing O2 continuously  1530 feet   Distance % Change  -  -8.3 %  21 %   Distance Feet Change  -  -105 ft  265 ft   Walk Time  6 minutes  6 minutes  6 minutes   # of Rest Breaks  0  0  0   MPH  2.4  2.2  2.89   METS  2.74  2.42  3.43   RPE  _0 Perceived Dyspnea   1  1  -   VO2 Peak  9.6  8.46  12   Symptoms  Yes (comment)  Yes (comment)  -   Comments  SOB  SOB  -   Resting HR  109 bpm  94 bpm  -   Resting BP  116/64  106/60  -   Resting Oxygen Saturation   99 %  95 %  -   Exercise Oxygen Saturation  during 6 min walk  97 %  92 %  -   Max Ex. HR  152 bpm  150 bpm  172 bpm   Max Ex. BP  160/74  146/70  152/84   2 Minute Post BP  124/64  -  -     Interval HR   1 Minute HR  -   126  -   2 Minute HR  -  137  -   3 Minute HR  -  130  -   4 Minute HR  -  148  -   5 Minute HR  -  143  -   6 Minute HR  -  146  -   Interval Heart Rate?  -  Yes  -     Interval Oxygen   Interval Oxygen?  -  Yes  -   Baseline Oxygen Saturation %  -  95 %  -   1 Minute Oxygen Saturation %  -  96 %  -   1 Minute Liters of Oxygen  -  2 L  -   2 Minute Oxygen Saturation %  -  96 %  -   2 Minute Liters of Oxygen  -  2 L  -   3 Minute Oxygen Saturation %  -  93 %  -   3 Minute Liters of Oxygen  -  2 L  -   4 Minute Oxygen Saturation %  -  97 %  -   4 Minute Liters of Oxygen  -  2 L  -   5 Minute Oxygen Saturation %  -  92 %  -   5 Minute Liters of Oxygen  -  2 L  -   6 Minute Oxygen Saturation %  -  94 %  -   6 Minute Liters of Oxygen  -  2 L  -   Row Name 02/18/17 1202 03/24/17 1048       6 Minute Walk   Phase  Initial using cardiac post 6MWT  Discharge    Distance  1530 feet  1566 feet    Walk Time  6 minutes  6 minutes    # of Rest Breaks  0  0  MPH  2.89  2.96    METS  3.43  2.83    RPE  15  15    Perceived Dyspnea   1  1    VO2 Peak  12  9.93    Symptoms  Yes (comment)  No    Comments  SOB  -    Resting HR  94 bpm  101 bpm    Resting BP  -  112/60    Resting Oxygen Saturation   95 %  96 %    Exercise Oxygen Saturation  during 6 min walk  92 %  92 %    Max Ex. HR  172 bpm  142 bpm    Max Ex. BP  152/84  138/68      Oxygen Initial Assessment: Oxygen Initial Assessment - 02/01/17 0858      Home Oxygen   Home Oxygen Device  Home Concentrator;E-Tanks    Sleep Oxygen Prescription  Continuous    Liters per minute  2    Home Exercise Oxygen Prescription  Continuous    Liters per minute  2    Home at Rest Exercise Oxygen Prescription  Continuous    Liters per minute  2    Compliance with Home Oxygen Use  Yes      Program Oxygen Prescription   Program Oxygen Prescription  Continuous;E-Tanks    Liters per minute  2      Intervention   Short Term Goals  To learn  and exhibit compliance with exercise, home and travel O2 prescription;To learn and understand importance of maintaining oxygen saturations>88%;To learn and understand importance of monitoring SPO2 with pulse oximeter and demonstrate accurate use of the pulse oximeter.;To learn and demonstrate proper pursed lip breathing techniques or other breathing techniques.    Long  Term Goals  Maintenance of O2 saturations>88%;Exhibits compliance with exercise, home and travel O2 prescription;Verbalizes importance of monitoring SPO2 with pulse oximeter and return demonstration;Exhibits proper breathing techniques, such as pursed lip breathing or other method taught during program session       Oxygen Re-Evaluation: Oxygen Re-Evaluation    Row Name 02/01/17 0859 02/22/17 1014 03/02/17 1702 03/10/17 1027       Program Oxygen Prescription   Program Oxygen Prescription  Continuous;E-Tanks  Continuous;E-Tanks  Continuous;E-Tanks  None    Liters per minute  _0 -      Home Oxygen   Home Oxygen Device  Home Concentrator;E-Tanks  Home Concentrator;E-Tanks  Home Concentrator;E-Tanks  None    Sleep Oxygen Prescription  Continuous  Continuous  Continuous  None    Liters per minute  2  -  2  -    Home Exercise Oxygen Prescription  Continuous  Continuous  Continuous  None    Liters per minute  _1 -    Home at Rest Exercise Oxygen Prescription  Continuous  Continuous  Continuous  None    Liters per minute  _2 -    Compliance with Home Oxygen Use  Yes  Yes  Yes  -      Goals/Expected Outcomes   Short Term Goals  To learn and exhibit compliance with exercise, home and travel O2 prescription;To learn and understand importance of maintaining oxygen saturations>88%;To learn and understand importance of monitoring SPO2 with pulse oximeter and demonstrate accurate use of the pulse oximeter.;To learn and demonstrate proper pursed lip breathing techniques or other breathing techniques.  To learn and  exhibit compliance with exercise, home and travel O2 prescription;To learn and understand importance of maintaining oxygen saturations>88%;To learn and understand importance of monitoring SPO2 with pulse oximeter and demonstrate accurate use of the pulse oximeter.;To learn and demonstrate proper pursed lip breathing techniques or other breathing techniques.  To learn and exhibit compliance with exercise, home and travel O2 prescription;To learn and understand importance of maintaining oxygen saturations>88%;To learn and understand importance of monitoring SPO2 with pulse oximeter and demonstrate accurate use of the pulse oximeter.;To learn and demonstrate proper pursed lip breathing techniques or other breathing techniques.  To learn and understand importance of maintaining oxygen saturations>88%;To learn and demonstrate proper use of respiratory medications;To learn and demonstrate proper pursed lip breathing techniques or other breathing techniques.;To learn and understand importance of monitoring SPO2 with pulse oximeter and demonstrate accurate use of the pulse oximeter.    Long  Term Goals  Maintenance of O2 saturations>88%;Exhibits compliance with exercise, home and travel O2 prescription;Verbalizes importance of monitoring SPO2 with pulse oximeter and return demonstration;Exhibits proper breathing techniques, such as pursed lip breathing or other method taught during program session  Maintenance of O2 saturations>88%;Exhibits compliance with exercise, home and travel O2 prescription;Verbalizes importance of monitoring SPO2 with pulse oximeter and return demonstration;Exhibits proper breathing techniques, such as pursed lip breathing or other method taught during program session  Maintenance of O2 saturations>88%;Exhibits compliance with exercise, home and travel O2 prescription;Verbalizes importance of monitoring SPO2 with pulse oximeter and return demonstration;Exhibits proper breathing techniques,  such as pursed lip breathing or other method taught during program session  Demonstrates proper use of MDI's;Compliance with respiratory medication;Exhibits proper breathing techniques, such as pursed lip breathing or other method taught during program session;Maintenance of O2 saturations>88%;Verbalizes importance of monitoring SPO2 with pulse oximeter and return demonstration    Comments  Reviewed PLB technique with pt.  Talked about how it work and it's important to maintaining his exercise saturations.    Reviewed PLB technique with pt.  Talked about how it work and it's important to maintaining his exercise saturations.    [name] has been using his own pulse oximeter which he brought to class for instructions for use. His oxygen levels have been in the mid to high 90's on 2 liters of oxygen. Next session he will start using 1 liter of oxygen for each of his exercises. He sees his pulmonologist on Monday and will tell the doctor of the results.  Rae's doctor stated that he did not need oxygen after doing rehab. His oxygen was Tapered off liter by liter. His oxygen has been in the high 90's on his exercise equipment with no oxygen.    Goals/Expected Outcomes  Short: Become more profiecient at using PLB.   Long: Become independent at using PLB.  Short: Become more profiecient at using PLB.   Long: Become independent at using PLB.  Short: use 1 liter less of oxygen for exercise. Long: wean off oxygen for exercise.  Short: continue to monitor oxygen at home. Long: Monitor oxygen at home independently       Oxygen Discharge (Final Oxygen Re-Evaluation): Oxygen Re-Evaluation - 03/10/17 1027      Program Oxygen Prescription   Program Oxygen Prescription  None      Home Oxygen   Home Oxygen Device  None    Sleep Oxygen Prescription  None    Home Exercise Oxygen Prescription  None    Home at Rest Exercise Oxygen Prescription  None  Goals/Expected Outcomes   Short Term Goals  To learn and understand  importance of maintaining oxygen saturations>88%;To learn and demonstrate proper use of respiratory medications;To learn and demonstrate proper pursed lip breathing techniques or other breathing techniques.;To learn and understand importance of monitoring SPO2 with pulse oximeter and demonstrate accurate use of the pulse oximeter.    Long  Term Goals  Demonstrates proper use of MDI's;Compliance with respiratory medication;Exhibits proper breathing techniques, such as pursed lip breathing or other method taught during program session;Maintenance of O2 saturations>88%;Verbalizes importance of monitoring SPO2 with pulse oximeter and return demonstration    Comments  Rae's doctor stated that he did not need oxygen after doing rehab. His oxygen was Tapered off liter by liter. His oxygen has been in the high 90's on his exercise equipment with no oxygen.    Goals/Expected Outcomes  Short: continue to monitor oxygen at home. Long: Monitor oxygen at home independently       Initial Exercise Prescription: Initial Exercise Prescription - 02/18/17 1200      Date of Initial Exercise RX and Referring Provider   Date  02/18/17 Starting Pulmonary on 2/18    Referring Provider  Deon Pilling      Oxygen   Oxygen  Continuous    Liters  2      Recumbant Bike   Level  4    RPM  30    Watts  20    Minutes  15    METs  2.79      NuStep   Level  6    SPM  80    Minutes  15    METs  3.5      Track   Laps  30    Minutes  15    METs  2.38      Prescription Details   Frequency (times per week)  3    Duration  Progress to 45 minutes of aerobic exercise without signs/symptoms of physical distress      Intensity   THRR 40-80% of Max Heartrate  110-125    Ratings of Perceived Exertion  11-13    Perceived Dyspnea  0-4      Progression   Progression  Continue to progress workloads to maintain intensity without signs/symptoms of physical distress.      Resistance Training   Training Prescription   Yes    Weight  4 lbs    Reps  10-15       Perform Capillary Blood Glucose checks as needed.  Exercise Prescription Changes: Exercise Prescription Changes    Row Name 11/30/16 1400 12/15/16 1300 12/16/16 0800 12/30/16 1500 01/13/17 1500     Response to Exercise   Blood Pressure (Admit)  116/64  128/64  -  126/54  124/82   Blood Pressure (Exercise)  124/64  128/64  -  128/72  116/74   Blood Pressure (Exit)  112/64  124/70  -  126/74  128/70   Heart Rate (Admit)  109 bpm  104 bpm  -  98 bpm  111 bpm   Heart Rate (Exercise)  152 bpm  160 bpm  -  147 bpm high heart rate during resistance training  100 bpm   Heart Rate (Exit)  106 bpm  109 bpm  -  105 bpm  118 bpm   Oxygen Saturation (Admit)  99 %  -  -  -  -   Oxygen Saturation (Exercise)  97 %  -  -  -  -  Rating of Perceived Exertion (Exercise)  13  15  -  11  11   Perceived Dyspnea (Exercise)  1  -  -  -  -   Symptoms  SOB  none  -  none  none   Comments  walk test resutls  -  -  -  -   Duration  -  Continue with 45 min of aerobic exercise without signs/symptoms of physical distress.  -  Continue with 45 min of aerobic exercise without signs/symptoms of physical distress.  Continue with 45 min of aerobic exercise without signs/symptoms of physical distress.   Intensity  -  THRR unchanged  -  THRR unchanged  THRR unchanged     Progression   Progression  -  Continue to progress workloads to maintain intensity without signs/symptoms of physical distress.  -  Continue to progress workloads to maintain intensity without signs/symptoms of physical distress.  Continue to progress workloads to maintain intensity without signs/symptoms of physical distress.   Average METs  -  2.62  -  3.01  3.27     Resistance Training   Training Prescription  -  Yes  -  Yes  Yes   Weight  -  3 lbs  -  4 lbs  4 lbs   Reps  -  10-15  -  10-15  10-15     Interval Training   Interval Training  -  No  -  No  No     Recumbant Bike   Level  -  3  -  4  4    Watts  -  18  -  25  18   Minutes  -  15  -  15  15   METs  -  2.79  -  2.79  2.79     NuStep   Level  -  4  -  5  6   Minutes  -  15  -  15  15   METs  -  2.1  -  3.4  3.9     Track   Laps  -  46  -  40  46   Minutes  -  15  -  15  15   METs  -  3.14  -  2.84  3.14     Home Exercise Plan   Plans to continue exercise at  -  -  Home (comment) walking  Home (comment) walking  Home (comment) walking   Frequency  -  -  Add 2 additional days to program exercise sessions.  Add 2 additional days to program exercise sessions.  Add 2 additional days to program exercise sessions.   Initial Home Exercises Provided  -  -  12/16/16  12/16/16  12/16/16   Row Name 01/25/17 1500 02/10/17 1400 03/03/17 1500 03/17/17 1200       Response to Exercise   Blood Pressure (Admit)  114/60  112/72  116/68  128/64    Blood Pressure (Exercise)  112/60  122/62  -  -    Blood Pressure (Exit)  118/60  132/64  106/56  -    Heart Rate (Admit)  124 bpm  125 bpm  102 bpm  102 bpm    Heart Rate (Exercise)  134 bpm  150 bpm  144 bpm  132 bpm    Heart Rate (Exit)  112 bpm  102 bpm  106 bpm  86 bpm  Rating of Perceived Exertion (Exercise)  _0 Perceived Dyspnea (Exercise)  -  -  1  1    Symptoms  none  none  none  none    Duration  Continue with 45 min of aerobic exercise without signs/symptoms of physical distress.  Continue with 45 min of aerobic exercise without signs/symptoms of physical distress.  Continue with 45 min of aerobic exercise without signs/symptoms of physical distress.  Continue with 45 min of aerobic exercise without signs/symptoms of physical distress.    Intensity  THRR unchanged  THRR unchanged  THRR unchanged  THRR unchanged      Progression   Progression  Continue to progress workloads to maintain intensity without signs/symptoms of physical distress.  Continue to progress workloads to maintain intensity without signs/symptoms of physical distress.  Continue to progress workloads  to maintain intensity without signs/symptoms of physical distress.  Continue to progress workloads to maintain intensity without signs/symptoms of physical distress.    Average METs  2.63  2.96  3.4  2.95      Resistance Training   Training Prescription  Yes  Yes  Yes  Yes    Weight  4 lbs  4 lbs  4 lb  4 lb    Reps  10-15  10-15  10-15  10-15      Interval Training   Interval Training  No  No  No  No      Oxygen   Oxygen  -  -  Continuous  -    Liters  -  -  1  -      Recumbant Bike   Level  4  4  -  6    RPM  -  -  -  42    Watts  19  22  -  20    Minutes  15  15  -  15    METs  2.79  2.79  -  2.6      NuStep   Level  _1 -    SPM  -  -  80  -    Minutes  _2 -    METs  2.9  3.7  3.8  -      Track   Laps  30  30  44  57    Minutes  _3 METs  2.38  2.38  3.07  3.3      Home Exercise Plan   Plans to continue exercise at  Home (comment) walking  Home (comment) walking  Home (comment) walking  -    Frequency  Add 2 additional days to program exercise sessions.  Add 2 additional days to program exercise sessions.  Add 2 additional days to program exercise sessions.  -    Initial Home Exercises Provided  12/16/16  12/16/16  12/16/16  -       Exercise Comments: Exercise Comments    Row Name 12/07/16 0804 01/25/17 0934 02/15/17 0808 02/22/17 1013 03/26/17 1015   Exercise Comments  First full day of exercise!  Patient was oriented to gym and equipment including functions, settings, policies, and procedures.  Patient's individual exercise prescription and treatment plan were reviewed.  All starting workloads were established based on the results of the 6 minute walk test done at initial orientation visit.  The  plan for exercise progression was also introduced and progression will be customized based on patient's performance and goals.  [name] mentioned that he was not feeling well today.  He couldn't really pinpoint anything in particular, just not feeling  well.  He sat through class without at problem.  [name] was able to exercise today without a problem, but he just stayed on the NuStep today for the whole time.   Mahmoud graduated today from cardiac rehab with 36 sessions completed.  Details of the patient's exercise prescription and what He needs to do in order to continue the prescription and progress were discussed with patient.  Patient was given a copy of prescription and goals.  Patient verbalized understanding.  Javarus plans to continue to exercise by coming into Pulmonary Rehab.  First full day of exercise!  Patient was oriented to gym and equipment including functions, settings, policies, and procedures.  Patient's individual exercise prescription and treatment plan were reviewed.  All starting workloads were established based on the results of the 6 minute walk test done at initial orientation visit.  The plan for exercise progression was also introduced and progression will be customized based on patient's performance and goals.  Kache graduated today from  rehab with 36 sessions completed.  Details of the patient's exercise prescription and what He needs to do in order to continue the prescription and progress were discussed with patient.  Patient was given a copy of prescription and goals.  Patient verbalized understanding.  Kamron plans to continue to exercise by walking at home.      Exercise Goals and Review: Exercise Goals    Row Name 11/30/16 1503             Exercise Goals   Increase Physical Activity  Yes       Intervention  Provide advice, education, support and counseling about physical activity/exercise needs.;Develop an individualized exercise prescription for aerobic and resistive training based on initial evaluation findings, risk stratification, comorbidities and participant's personal goals.       Expected Outcomes  Achievement of increased cardiorespiratory fitness and enhanced flexibility, muscular endurance and  strength shown through measurements of functional capacity and personal statement of participant.       Increase Strength and Stamina  Yes       Intervention  Provide advice, education, support and counseling about physical activity/exercise needs.;Develop an individualized exercise prescription for aerobic and resistive training based on initial evaluation findings, risk stratification, comorbidities and participant's personal goals.       Expected Outcomes  Achievement of increased cardiorespiratory fitness and enhanced flexibility, muscular endurance and strength shown through measurements of functional capacity and personal statement of participant.       Able to understand and use rate of perceived exertion (RPE) scale  Yes       Intervention  Provide education and explanation on how to use RPE scale       Expected Outcomes  Short Term: Able to use RPE daily in rehab to express subjective intensity level;Long Term:  Able to use RPE to guide intensity level when exercising independently       Knowledge and understanding of Target Heart Rate Range (THRR)  Yes       Intervention  Provide education and explanation of THRR including how the numbers were predicted and where they are located for reference       Expected Outcomes  Short Term: Able to state/look up THRR;Long Term: Able to use THRR to govern  intensity when exercising independently;Short Term: Able to use daily as guideline for intensity in rehab       Able to check pulse independently  Yes       Intervention  Provide education and demonstration on how to check pulse in carotid and radial arteries.;Review the importance of being able to check your own pulse for safety during independent exercise       Expected Outcomes  Short Term: Able to explain why pulse checking is important during independent exercise;Long Term: Able to check pulse independently and accurately       Understanding of Exercise Prescription  Yes       Intervention  Provide  education, explanation, and written materials on patient's individual exercise prescription       Expected Outcomes  Short Term: Able to explain program exercise prescription;Long Term: Able to explain home exercise prescription to exercise independently          Exercise Goals Re-Evaluation : Exercise Goals Re-Evaluation    Row Name 12/07/16 0805 12/15/16 1309 12/16/16 0816 12/30/16 1512 01/13/17 1536     Exercise Goal Re-Evaluation   Exercise Goals Review  Knowledge and understanding of Target Heart Rate Range (THRR);Able to understand and use rate of perceived exertion (RPE) scale;Understanding of Exercise Prescription  Increase Physical Activity;Increase Strength and Stamina;Understanding of Exercise Prescription  -  Increase Physical Activity;Increase Strength and Stamina;Understanding of Exercise Prescription  Increase Physical Activity;Increase Strength and Stamina;Understanding of Exercise Prescription   Comments  Reviewed RPE scale, THR and program prescription with pt today.  Pt voiced understanding and was given a copy of goals to take home.   [name] is off to a good start in rehab.  He is already up to 18 watts on the recumbent bike.  We will continue to monitor his progress.   Reviewed home exercise with pt today.  Pt plans to walk at home for exercise.   He will be walking on his off days from exercise.  Reviewed THR, pulse, RPE, sign and symptoms, NTG use, and when to call 911 or MD.  Also discussed weather considerations and indoor options.  Pt voiced understanding.  [name] has been doing well in rehab.  He has not been walking at much as we talked about but he goes on occasion.  He is up to 25 watts on the recumbent bike and level 5 on the NuStep.  We will continue to monitor his progression.   [name] continues to do well in rehab.  He was out today for a [place] appointment.  He continues to not walk at home.  He is up to level 6 now on the NuStep.  We will continue to encourage him to walk at home  and to monitor his progression.    Expected Outcomes  Short: Use RPE daily to regulate intensity.  Long: Follow program prescription in THR.  Short: Continue to attend regularly and review home exercise.  Long: Continue to follow program prescription.   Short: Add in walking daily as part of routine to build strength and stamina. Long: Make exercise part of routine.   Short: Continue to try to exercise more at home on off days.  Long: Continue to increase physical activity levels.   Short: Continue to ty increase watts on recumbent bike.  Long: Continue to walk more at home.    [place] Name 01/22/17 0800 01/25/17 1542 02/10/17 1443 02/22/17 1013 03/01/17 1111     Exercise Goal Re-Evaluation   Exercise Goals Review  Increase Physical Activity;Increase Strength and Stamina;Understanding of Exercise Prescription  Increase Physical Activity;Increase Strength and Stamina;Understanding of Exercise Prescription  Increase Physical Activity;Increase Strength and Stamina;Understanding of Exercise Prescription  Understanding of Exercise Prescription;Knowledge and understanding of Target Heart Rate Range (THRR);Able to understand and use Dyspnea scale;Able to understand and use rate of perceived exertion (RPE) scale  Increase Physical Activity;Increase Strength and Stamina   Comments  [name] has been doing well in rehab.  He has only been doing some of his exercise at home.  He stays active but not actually taking the time to go for his walk.  He said that it would help if it had more incentive to do his walking.  He says he lacks a appetitie, maybe more exercise can help.  He is open to trying it out.   [name] has continued to do well in rehab.  Today, he did not feel as good so he stayed on the NuStep the whole time.  He has worked upto 19 watts on the recumbent bike.  We will continue to monitor his progression.   [name] will be graduating on Friday!!  He plans to continue to exercise by coming to Pulmonary Rehab.   Reviewed RPE  scale, THR and program prescription with pt today.  Pt voiced understanding and was given a copy of goals to take home.   Reviewed home exercise with pt today.  Pt plans to walk continuously for 20-30 one extra day for exercise.  Reviewed THR, pulse, RPE, sign and symptoms, NTG use, and when to call 911 or MD.  Also discussed weather considerations and indoor options.  Pt voiced understanding.   Expected Outcomes  Short: Walk more at home to help with appetite.  Long: Continue to work on IT sales professional.  Short: Continue to try to increase number of laps walking back to where he was before.  Long: Continue to exercise more to work on strength and stamina.   Short: Graduation.  Long: Enroll in pulmonary rehab to continue to exercise.   Short: Use RPE daily to regulate intensity.  Long: Follow program prescription in THR.  Short: walk 2 extra day a week for 20-30 minutes. Long: Maintain home exercise independently   [place] Name 03/03/17 1530 03/10/17 1038 03/17/17 1230         Exercise Goal Re-Evaluation   Exercise Goals Review  Increase Physical Activity;Able to understand and use rate of perceived exertion (RPE) scale;Knowledge and understanding of Target Heart Rate Range (THRR);Able to understand and use Dyspnea scale  Increase Physical Activity;Increase Strength and Stamina  Increase Physical Activity;Increase Strength and Stamina;Able to understand and use Dyspnea scale;Able to understand and use rate of perceived exertion (RPE) scale     Comments  [name] tried exercise with 1L of O2 today and stayed at 99%.  Staff wil continue to monitor  [name] is off oxygen while he exercises and is doing very well. [place] Informed him that he needs to continue exercising after he is done with the program.  [name] has been able to exercise without supplemental O2 -  sats and HR have been good      Expected Outcomes  Short - [name] will continue to use 1 L O2 and  maintain proper levels during exercise Long - [name] would like to exercise without O2  Short: continue exercising while keeping his oxygen level above 88 percent. Long: maintain exercise at lease 3-5  days a week for 30 minutes independently.  Short - [name] will continue to attend Long - [name] will increase overall MET level        Discharge Exercise Prescription (Final Exercise Prescription Changes): Exercise Prescription Changes - 03/17/17 1200      Response to Exercise   Blood Pressure (Admit)  128/64    Heart Rate (Admit)  102 bpm    Heart Rate (Exercise)  132 bpm    Heart Rate (Exit)  86 bpm    Rating of Perceived Exertion (Exercise)  11    Perceived Dyspnea (Exercise)  1    Symptoms  none    Duration  Continue with 45 min of aerobic exercise without signs/symptoms of physical distress.    Intensity  THRR unchanged      Progression   Progression  Continue to progress workloads to maintain intensity without signs/symptoms of physical distress.    Average METs  2.95      Resistance Training   Training Prescription  Yes    Weight  4 lb    Reps  10-15      Interval Training   Interval Training  No      Recumbant Bike   Level  6    RPM  42    Watts  20    Minutes  15    METs  2.6      Track   Laps  57    Minutes  15    METs  3.3       Nutrition:  Target Goals: Understanding of nutrition guidelines, daily intake of sodium <1585m, cholesterol <2014m calories 30% from fat and 7% or less from saturated fats, daily to have 5 or more servings of fruits and vegetables.  Biometrics: Pre Biometrics - 11/30/16 1504      Pre Biometrics   Height  6' 0.5" (1.842 m)    Weight  221 lb (100.2 kg)    Waist Circumference  39 inches    Hip Circumference  43 inches    Waist to Hip Ratio  0.91 %    BMI (Calculated)  29.54    Single Leg Stand  1.65 seconds      Post Biometrics - 03/24/17 1047       Post  Biometrics   Height  6' (1.829 m)    Weight  221 lb (100.2 kg)    Waist  Circumference  40 inches    Hip Circumference  45 inches    Waist to Hip Ratio  0.89 %    BMI (Calculated)  29.97       Nutrition Therapy Plan and Nutrition Goals: Nutrition Therapy & Goals - 02/03/17 0922      Nutrition Therapy   Diet  TLC    Drug/Food Interactions  Statins/Certain Fruits    Protein (specify units)  9oz    Fiber  36 grams    Saturated Fats  16 max. grams    Fruits and Vegetables  6 servings/day    Sodium  2000 grams      Personal Nutrition Goals   Nutrition Goal  Choose foods with strong flavors "twang" and aromas to help stimulate appetite    Personal Goal #2  Continue to drink Ensure supplement drinks on a regular basis until appetite improves    Personal Goal #3  Eat a source of protein at each meal and snack      Intervention Plan   Intervention  Prescribe, educate and  counsel regarding individualized specific dietary modifications aiming towards targeted core components such as weight, hypertension, lipid management, diabetes, heart failure and other comorbidities.    Expected Outcomes  Short Term Goal: Understand basic principles of dietary content, such as calories, fat, sodium, cholesterol and nutrients.;Short Term Goal: A plan has been developed with personal nutrition goals set during dietitian appointment.;Long Term Goal: Adherence to prescribed nutrition plan.       Nutrition Assessments: Nutrition Assessments - 03/26/17 1024      MEDFICTS Scores   Pre Score  9       Nutrition Goals Re-Evaluation: Nutrition Goals Re-Evaluation    Row Name 12/16/16 0804 01/22/17 0808 03/10/17 1041         Goals   Current Weight  -  219 lb (99.3 kg)  216 lb (98 kg)     Nutrition Goal  Meet with Lattie Haw about nutrtition  Meet with Lattie Haw to talk about appetitie loss and how to kick it up a notch.  Also would like to continue to lose weight.   Maintain or Lose Weight.     Comment  [name] does not want to set up a separate appointment for nutrition.  He is open to  meeting during class for some tips to his diet and hearing her suggestions.   [name] missed his appointmet with nutrition.  We will reschedule to happen during class on Jan 30.  [name] states he is happy with his weight and if he loses weight that is ok with him. He tries to eat a healthier diet.      Expected Outcome  Short: Meet with Lattie Haw.  Long: Apply some tips to his diet and maintain heart healthy diet.   Short: Meet with Lattie Haw.  Long: Apply some tips to his diet and maintain heart healthy diet.   Short: make healthier food choices after [facility]. Long: Maintain a hearth healthy diet        Nutrition Goals Discharge (Final Nutrition Goals Re-Evaluation): Nutrition Goals Re-Evaluation - 03/10/17 1041      Goals   Current Weight  216 lb (98 kg)    Nutrition Goal  Maintain or Lose Weight.    Comment  [name] states he is happy with his weight and if he loses weight that is ok with him. He tries to eat a healthier diet.     Expected Outcome  Short: make healthier food choices after [facility]. Long: Maintain a hearth healthy diet       Psychosocial: Target Goals: Acknowledge presence or absence of significant depression and/or stress, maximize coping skills, provide positive support system. Participant is able to verbalize types and ability to use techniques and skills needed for reducing stress and depression.   Initial Review & Psychosocial Screening: Initial Psych Review & Screening - 11/30/16 1410      Initial Review   Current issues with  Current Sleep Concerns      Family Dynamics   Good Support System?  Yes    Comments  Wife and daughter      Barriers   Psychosocial barriers to participate in program  The patient should benefit from training in stress management and relaxation.      Screening Interventions   Interventions  Yes;Encouraged to exercise;Provide feedback about the scores to participant;Program counselor consult;To provide support and resources with identified psychosocial  needs    Expected Outcomes  Short Term goal: Utilizing psychosocial counselor, staff and physician to assist with identification of specific Stressors or current  issues interfering with healing process. Setting desired goal for each stressor or current issue identified.;Long Term Goal: Stressors or current issues are controlled or eliminated.;Short Term goal: Identification and review with participant of any Quality of Life or Depression concerns found by scoring the questionnaire.;Long Term goal: The participant improves quality of Life and PHQ9 Scores as seen by post scores and/or verbalization of changes       Quality of Life Scores: Quality of Life - 02/15/17 1511      Quality of Life Scores   Health/Function Pre  27.17 %    Health/Function Post  24.4 %    Health/Function % Change  -10.2 %    Socioeconomic Pre  30 %    Socioeconomic Post  27.75 %    Socioeconomic % Change   -7.5 %    Psych/Spiritual Pre  30 %    Psych/Spiritual Post  30 %    Psych/Spiritual % Change  0 %    Family Pre  30 %    Family Post  30 %    Family % Change  0 %    GLOBAL Pre  28.78 %    GLOBAL Post  27.09 %    GLOBAL % Change  -5.87 %      Scores of 19 and below usually indicate a poorer quality of life in these areas.  A difference of  2-3 points is a clinically meaningful difference.  A difference of 2-3 points in the total score of the Quality of Life Index has been associated with significant improvement in overall quality of life, self-image, physical symptoms, and general health in studies assessing change in quality of life.  PHQ-9: Recent Review Flowsheet Data    Depression screen [facility] 2/9 03/26/2017 02/15/2017 02/15/2017 01/25/2017 12/07/2016   Decreased Interest 0 2 - 2 2   Down, Depressed, Hopeless 0 2 - 1 1   PHQ - 2 Score 0 4 - 3 3   Altered sleeping 0 _0 0   Tired, decreased energy 0 3 - 2 2   Change in appetite 0 0 - 3 3   Feeling bad or failure about yourself  0 0 - 1 0   Trouble  concentrating 0 0 - 0 1   Moving slowly or fidgety/restless 0 0 - 0 1   Suicidal thoughts 0 0 - 1 0   PHQ-9 Score 0 8 - 11 10   Difficult doing work/chores Not difficult at all - - Somewhat difficult Somewhat difficult     Interpretation of Total Score  Total Score Depression Severity:  1-4 = Minimal depression, 5-9 = Mild depression, 10-14 = Moderate depression, 15-19 = Moderately severe depression, 20-27 = Severe depression   Psychosocial Evaluation and Intervention: Psychosocial Evaluation - 02/15/17 0926      Discharge Psychosocial Assessment & Intervention   Comments  Counselor met with Mr. Chichester [name]) for discharge evaluation.  He reports finishing this program today and beginning with Pulmonary Rehab next Monday.  He plans to walk several times this week to maintain his progress.  [name] states he is feeling stronger; breathing better; and having more energy overall.  This reportedly impacts his mood as he reports feeling less depressed.  His PHQ-9 dropped several points from "11" to an "8" from moderate down to mild depressive symptoms.  He denies thoughts of self-harm and recognizes he has "much to live for" at the age of 23.  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  He also has a tractor and when the weather breaks to get out and begin some gardening.  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  Counselor commended [name] on all his progress and hard work.  He is committed to consistent exercise and this has been very positive for him.  Counselor will follow him in Pulmonary Rehab as well.         Psychosocial Re-Evaluation: Psychosocial Re-Evaluation    Row Name 12/16/16 0805 01/22/17 9509 01/25/17 0916 02/03/17 1002 03/10/17 1043     Psychosocial Re-Evaluation   Current issues with  Current Stress Concerns  Current Stress Concerns  Current Stress  Concerns  -  None Identified   Comments  [name] has been doing well in rehab. He says class has been hard as he is not used to working this hard. 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  He enjoys working and finds himself to be more content while driving.  His energy is getting better and he feels ready to back to work.Marland Kitchen  He continues to sleep well.   [name] has continued to do well in rehab.  He still would like to go back to work, but he is waiting for his doctor to clear him to return.  He spoke with them yesterday and is awaiting a response.  Overall, he is feeling better but he does have days where he doesn't feel 100% still.  He is postivie and has a strong support system.  He has also been sleeping better.  Biggest benefit of rehab has been getting stronger as he was feeling weak.  Also if he could eat better that would help to improve his strength too.   Counselor follow up with [name] today reporting continues to have appetite problems which are impacting his energy level and mood to some degree.  He states this has been an ongoing concern since his recent cardiac issue wherein he has to "force food."  He is losing a little weight but nothing significant.  [name] has an appointment to meet with the Dietician later this month re: this.  counselor reviewed Rae's PHQ-9 depression inventory which increased "1" point from a "10" initially to an "11," indicating [name] may be moderately depressed.  He also stated he has "thoughts" of self harm sometimes and counselor assessed this with [name] reporting no plan - but because he feels so badly at times with lack of energy and appetite and some sleep issues, these thoughts do occur on occasion.  [name] also reports he has tried to reach his Dr. to discuss this but is having difficulty connecting.  Counselor and staff will follow with [name] through the dietician's meeting and recommendations.  Counselor also recommended that [name] continue to try to reach his Dr. to report  these concerns.  He plans to consult with his spouse and his daughter for concensus on how to move forward on these suggestions.    Counselor met with [name] today stating his appetite and energy this week are "a little better" as he is working out more consistently.  He was scheduled to meet with the dietician today and staff will follow with him after that occurs.    [name] states his stress has gone down dramatically being off of oxygen. He says his home life is  great and couldnt be any better.   Expected Outcomes  Short: Talk to doctor about getting back to work.  Long: Continue to enjoy life and maintain positive attitude.   Short: Hear back about going back to work.  Long: Continue to maintain his positive attitude.   [name] will meet with the dietician about his appetite issues and will continue to try to reach his Cardiologist as well.  [name] will continue to exercise to increase his stamina and energy levels.  Staff will continue to follow with [name].  [name] will meet with the dietician today to help with his appetite concerns.  He will also continue to exercise consistently to achieve his stated goals.    Short: maintain exercise at home to keep stress to a minimum. Long: Exercise independently to keep stress at a minimum.   Interventions  Encouraged to attend Cardiac Rehabilitation for the exercise;Stress management education  -  Stress management education  -  Encouraged to attend Pulmonary Rehabilitation for the exercise   Continue Psychosocial Services   Follow up required by staff  -  Follow up required by staff  Follow up required by staff  Follow up required by staff   Comments  -  [name] still would like to go back to work if they let him.  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   -  -  -     Initial Review   Source of Stress Concerns  -  Occupation  -  -  -      Psychosocial Discharge (Final Psychosocial Re-Evaluation): Psychosocial Re-Evaluation - 03/10/17 1043      Psychosocial Re-Evaluation    Current issues with  None Identified    Comments  [name] states his stress has gone down dramatically being off of oxygen. He says his home life is great and couldnt be any better.    Expected Outcomes  Short: maintain exercise at home to keep stress to a minimum. Long: Exercise independently to keep stress at a minimum.    Interventions  Encouraged to attend Pulmonary Rehabilitation for the exercise    Continue Psychosocial Services   Follow up required by staff       Education: Education Goals: Education classes will be provided on a weekly basis, covering required topics. Participant will state understanding/return demonstration of topics presented.  Learning Barriers/Preferences: Learning Barriers/Preferences - 11/30/16 1415      Learning Barriers/Preferences   Learning Barriers  Hearing    Learning Preferences  Verbal Instruction;Skilled Demonstration       Education Topics:  Initial Evaluation Education: - Verbal, written and demonstration of respiratory meds, oximetry and breathing techniques. Instruction on use of nebulizers and MDIs and importance of monitoring MDI activations.   General Nutrition Guidelines/Fats and Fiber: -Group instruction provided by verbal, written material, models and posters to present the general guidelines for heart healthy nutrition. Gives an explanation and review of dietary fats and fiber.   Pulmonary Rehab from 03/17/2017 in [facility] Cardiac and Pulmonary Rehab  Date  02/01/17  Educator  CR  Instruction Review Code  1- Verbalizes Understanding      Controlling Sodium/Reading Food Labels: -Group verbal and written material supporting the discussion of sodium use in heart healthy nutrition. Review and explanation with models, verbal and written materials for utilization of the food label.   Pulmonary Rehab from 03/17/2017 in [facility] Cardiac and Pulmonary Rehab  Date  02/08/17  Educator  PI  Instruction Review Code  1- Verbalizes Understanding       Exercise Physiology & General Exercise Guidelines: - Group verbal and written instruction with models to review the exercise physiology of the cardiovascular system and associated critical values. Provides general exercise guidelines with specific guidelines to those with heart or lung disease.    Pulmonary Rehab from 03/17/2017 in [facility] Cardiac and Pulmonary Rehab  Date  12/30/16  Educator  [facility]  Instruction Review Code  1- Verbalizes Understanding      Aerobic Exercise & Resistance Training: - Gives group verbal and written instruction on the various components of exercise. Focuses on aerobic and resistive training programs and the benefits of this training and how to safely progress through these programs.   Pulmonary Rehab from 03/17/2017 in [facility] Cardiac and Pulmonary Rehab  Date  01/06/17  Educator  [facility]  Instruction Review Code  1- Verbalizes Understanding      Flexibility, Balance, Mind/Body Relaxation: Provides group verbal/written instruction on the benefits of flexibility and balance training, including mind/body exercise modes such as yoga, pilates and tai chi.  Demonstration and skill practice provided.   Pulmonary Rehab from 03/17/2017 in [facility] Cardiac and Pulmonary Rehab  Date  01/11/17  Educator  [facility]  Instruction Review Code  1- Verbalizes Understanding      Stress and Anxiety: - Provides group verbal and written instruction about the health risks of elevated stress and causes of high stress.  Discuss the correlation between heart/lung disease and anxiety and treatment options. Review healthy ways to manage with stress and anxiety.   Pulmonary Rehab from 03/17/2017 in [facility] Cardiac and Pulmonary Rehab  Date  03/10/17  Educator  [facility]  Instruction Review Code  5- Refused Teaching [had class in cardiac]      Depression: - Provides group verbal and written instruction on the correlation between heart/lung disease and depressed mood, treatment options, and the stigmas  associated with seeking treatment.   Pulmonary Rehab from 03/17/2017 in [facility] Cardiac and Pulmonary Rehab  Date  02/24/17  Educator  [facility]  Instruction Review Code  1- Verbalizes Understanding      Exercise & Equipment Safety: - Individual verbal instruction and demonstration of equipment use and safety with use of the equipment.   Pulmonary Rehab from 03/17/2017 in [facility] Cardiac and Pulmonary Rehab  Date  11/30/16  Educator  [place]  Instruction Review Code  1- Verbalizes Understanding      Infection Prevention: - Provides verbal and written material to individual with discussion of infection control including proper hand washing and proper equipment cleaning during exercise session.   Pulmonary Rehab from 03/17/2017 in [facility] Cardiac and Pulmonary Rehab  Date  11/30/16  Educator  [place]  Instruction Review Code  1- Verbalizes Understanding      Falls Prevention: - Provides verbal and written material to individual with discussion of falls prevention and safety.   Pulmonary Rehab from 03/17/2017 in [facility] Cardiac and Pulmonary Rehab  Date  11/30/16  Educator  [place]  Instruction Review Code  1- Verbalizes Understanding      Diabetes: - Individual verbal and written instruction to review signs/symptoms of diabetes, desired ranges of glucose level fasting, after meals and with exercise. Advice that pre and post exercise glucose checks will be done for 3 sessions at entry of program.   Chronic Lung Diseases: - Group verbal and written instruction to review updates, respiratory medications, advancements in procedures and treatments. Discuss use of supplemental oxygen including available portable oxygen systems, continuous and intermittent flow  rates, concentrators, personal use and safety guidelines. Review proper use of inhaler and spacers. Provide informative websites for self-education.    Energy Conservation: - Provide group verbal and written instruction for methods to conserve energy, plan and  organize activities. Instruct on pacing techniques, use of adaptive equipment and posture/positioning to relieve shortness of breath.   Pulmonary Rehab from 03/17/2017 in [facility] Cardiac and Pulmonary Rehab  Date  03/03/17  Educator  [facility]  Instruction Review Code  1- Verbalizes Understanding      Triggers and Exacerbations: - Group verbal and written instruction to review types of environmental triggers and ways to prevent exacerbations. Discuss weather changes, air quality and the benefits of nasal washing. Review warning signs and symptoms to help prevent infections. Discuss techniques for effective airway clearance, coughing, and vibrations.   AED/CPR: - Group verbal and written instruction with the use of models to demonstrate the basic use of the AED with the basic ABC's of resuscitation.   Pulmonary Rehab from 03/17/2017 in [facility] Cardiac and Pulmonary Rehab  Date  02/15/17  Educator  [facility]  Instruction Review Code  1- Actuary and Physiology of the Lungs: - Group verbal and written instruction with the use of models to provide basic lung anatomy and physiology related to function, structure and complications of lung disease.   Pulmonary Rehab from 03/17/2017 in [facility] Cardiac and Pulmonary Rehab  Date  03/17/17  Educator  [facility]  Instruction Review Code  1- Verbalizes Understanding      Anatomy & Physiology of the Heart: - Group verbal and written instruction and models provide basic cardiac anatomy and physiology, with the coronary electrical and arterial systems. Review of Valvular disease and Heart Failure   Pulmonary Rehab from 03/17/2017 in [facility] Cardiac and Pulmonary Rehab  Date  01/25/17  Educator  [facility]  Instruction Review Code  1- Verbalizes Understanding      Cardiac Medications: - Group verbal and written instruction to review commonly prescribed medications for heart disease. Reviews the medication, class of the drug, and side effects.   Pulmonary  Rehab from 03/17/2017 in [facility] Cardiac and Pulmonary Rehab  Date  01/18/17  Educator  [place]  Instruction Review Code  1- Verbalizes Understanding      Know Your Numbers and Risk Factors: -Group verbal and written instruction about important numbers in your health.  Discussion of what are risk factors and how they play a role in the disease process.  Review of Cholesterol, Blood Pressure, Diabetes, and BMI and the role they play in your overall health.   Sleep Hygiene: -Provides group verbal and written instruction about how sleep can affect your health.  Define sleep hygiene, discuss sleep cycles and impact of sleep habits. Review good sleep hygiene tips.    Other: -Provides group and verbal instruction on various topics (see comments)    Knowledge Questionnaire Score: Knowledge Questionnaire Score - 03/26/17 1024      Knowledge Questionnaire Score   Pre Score  17/18    Post Score  16/18 reviewed with patient        Core Components/Risk Factors/Patient Goals at Admission: Personal Goals and Risk Factors at Admission - 11/30/16 1406      Core Components/Risk Factors/Patient Goals on Admission    Weight Management  Yes;Weight Loss    Intervention  Weight Management: Develop a combined nutrition and exercise program designed to reach desired caloric intake, while maintaining appropriate intake of nutrient and fiber, sodium and  fats, and appropriate energy expenditure required for the weight goal.;Weight Management: Provide education and appropriate resources to help participant work on and attain dietary goals. patient states he has lost weight recently without trying due to no appetite and not eating much.     Admit Weight  221 lb (100.2 kg)    Goal Weight: Short Term  215 lb (97.5 kg)    Goal Weight: Long Term  211 lb (95.7 kg)    Expected Outcomes  Long Term: Adherence to nutrition and physical activity/exercise program aimed toward attainment of established weight goal;Understanding  recommendations for meals to include 15-35% energy as protein, 25-35% energy from fat, 35-60% energy from carbohydrates, less than 235m of dietary cholesterol, 20-35 gm of total fiber daily;Understanding of distribution of calorie intake throughout the day with the consumption of 4-5 meals/snacks;Short Term: Continue to assess and modify interventions until short term weight is achieved;Weight Loss: Understanding of general recommendations for a balanced deficit meal plan, which promotes 1-2 lb weight loss per week and includes a negative energy balance of [phone] kcal/d    Improve shortness of breath with ADL's  Yes    Intervention  Provide education, individualized exercise plan and daily activity instruction to help decrease symptoms of SOB with activities of daily living.    Expected Outcomes  Short Term: Achieves a reduction of symptoms when performing activities of daily living.    Develop more efficient breathing techniques such as purse lipped breathing and diaphragmatic breathing; and practicing self-pacing with activity  Yes    Intervention  Provide education, demonstration and support about specific breathing techniuqes utilized for more efficient breathing. Include techniques such as pursed lipped breathing, diaphragmatic breathing and self-pacing activity.    Expected Outcomes  Short Term: Participant will be able to demonstrate and use breathing techniques as needed throughout daily activities.    Heart Failure  Yes    Intervention  Provide a combined exercise and nutrition program that is supplemented with education, support and counseling about heart failure. Directed toward relieving symptoms such as shortness of breath, decreased exercise tolerance, and extremity edema.    Expected Outcomes  Improve functional capacity of life;Short term: Attendance in program 2-3 days a week with increased exercise capacity. Reported lower sodium intake. Reported increased fruit and vegetable intake.  Reports medication compliance.;Short term: Daily weights obtained and reported for increase. Utilizing diuretic protocols set by physician.;Long term: Adoption of self-care skills and reduction of barriers for early signs and symptoms recognition and intervention leading to self-care maintenance.    Hypertension  Yes    Intervention  Provide education on lifestyle modifcations including regular physical activity/exercise, weight management, moderate sodium restriction and increased consumption of fresh fruit, vegetables, and low fat dairy, alcohol moderation, and smoking cessation.;Monitor prescription use compliance.    Expected Outcomes  Short Term: Continued assessment and intervention until BP is < 140/976mHG in hypertensive participants. < 130/8059mG in hypertensive participants with diabetes, heart failure or chronic kidney disease.;Long Term: Maintenance of blood pressure at goal levels.    Lipids  Yes    Intervention  Provide education and support for participant on nutrition & aerobic/resistive exercise along with prescribed medications to achieve LDL <46m45mDL >40mg90m Expected Outcomes  Short Term: Participant states understanding of desired cholesterol values and is compliant with medications prescribed. Participant is following exercise prescription and nutrition guidelines.;Long Term: Cholesterol controlled with medications as prescribed, with individualized exercise RX and with personalized nutrition plan. Value goals: LDL <  20m, HDL > 40 mg.    Stress  Yes    Intervention  Offer individual and/or small group education and counseling on adjustment to heart disease, stress management and health-related lifestyle change. Teach and support self-help strategies.;Refer participants experiencing significant psychosocial distress to appropriate mental health specialists for further evaluation and treatment. When possible, include family members and significant others in education/counseling  sessions.    Expected Outcomes  Short Term: Participant demonstrates changes in health-related behavior, relaxation and other stress management skills, ability to obtain effective social support, and compliance with psychotropic medications if prescribed.;Long Term: Emotional wellbeing is indicated by absence of clinically significant psychosocial distress or social isolation.       Core Components/Risk Factors/Patient Goals Review:  Goals and Risk Factor Review    Row Name 12/16/16 0800 01/22/17 0805 03/10/17 1034         Core Components/Risk Factors/Patient Goals Review   Personal Goals Review  Weight Management/Obesity;Heart Failure;Hypertension;Lipids;Improve shortness of breath with ADL's  Weight Management/Obesity;Heart Failure;Hypertension;Lipids;Improve shortness of breath with ADL's  Weight Management/Obesity;Heart Failure;Hypertension;Lipids;Improve shortness of breath with ADL's     Review  RDwyane Luois off to a good start.  His weight was up 1/2 lbs today as he was eating during the snow days.  His breathing is already starting to get better.  He has not had symptoms of his heart failure.  He weighs daily and watches his sodium intake.  His  blood pressures have been good and he checks them at home on occasion.  He has not had any problems with his medications.   RDwyane Luocontinues to do well in rehab.  He is down to 219 Lbs today.  His appetite has not been good and he feels that he is not eating enough.  We will have him talk with nutrition again.  His breathing is getting better and he is able to do more around the house.  He has continues to do better with his heart failure, no swelling, just the shortness of breath.  He continues to watch his sodium intake and weighing daily.  He continues to do well with his blood pressures and check them at home.   RDwyane Luohas lost some weight and his blood pressure has been in healthy ranges. He feels alot better now that he is off of oxygen. He states he wants to  maintain his current weight and if he loses a few pounds that would be  His shortness of breath has imprved at home. [place] RDwyane Luowants to graduate the program early due to driving a long distance. He wants to continue until the end of the month.     Expected Outcomes  Short: Continue to work on weight loss and check blood pressures a little more frequently.  Long: Continue to work risk factor modifications.   Short: Continue to work on weight loss and improving his SOB.  Long: Continue to work on risk factors.   Short: graduate [facility] Long: Maintain a home exercise regimen.        Core Components/Risk Factors/Patient Goals at Discharge (Final Review):  Goals and Risk Factor Review - 03/10/17 1034      Core Components/Risk Factors/Patient Goals Review   Personal Goals Review  Weight Management/Obesity;Heart Failure;Hypertension;Lipids;Improve shortness of breath with ADL's    Review  RDwyane Luohas lost some weight and his blood pressure has been in healthy ranges. He feels alot better now that  he is off of oxygen. He states he wants to maintain his current weight and if he loses a few pounds that would be  His shortness of breath has imprved at home. He [place] [name] wants to graduate the program early due to driving a long distance. He wants to continue until the end of the month.    Expected Outcomes  Short: graduate [facility]. Long: Maintain a home exercise regimen.       ITP Comments: ITP Comments    Row Name 11/30/16 1358 12/16/16 1414 01/18/17 0917 01/25/17 0934 01/26/17 1428   ITP Comments  Medical Review Completed; initial ITP created. Diagnosis Documentation can be found in [facility] encounter dated 11/07/2016.  Note sent with EKG strips to Dr Kandis Cocking regarding Rea's heartrate being eleveated above 100 during his visits. Getting as high as 160 during exercise exertion.   [name]  had several PVC runs, which is consistant with his rhythm history, but today there seemed to be more than usual. He stated that he felt fine and took all his meds. RN will send today's rhythm strips to his cardiologist.   [name] mentioned that he was not feeling well today.  He couldn't really pinpoint anything in particular, just not feeling well.  He sat through class without at problem.  [name] was able to exercise today without a problem, but he just stayed on the NuStep today for the whole time.   [facility] in [facility] discharge planner called that [facility] was NOT going to have home health but that he was being sent home on home supplemental oxgyen for recent history of pneumonia. . I suggested that she ask the MD to have [facility] complete Cardiac Rehab since it is basically the same staff in the same gym.    Row Name 02/01/17 7654 02/03/17 0558 02/15/17 0807 02/22/17 1538 03/01/17 1112   ITP Comments  [name] was cleared to return to rehab after his hospitalization.  Documentation found in discharge summary from [facility] visit from ED 01/25/17 in Care Everywhere.  He is now on oxygen therapy for rest and exercise.  Oxygen saturations stayed above 90% during exercise today.   30 Day review. Continue with ITP unless directed changes per Medical Director review.   Discharge ITP sent and signed by Dr. Sabra Heck.  Discharge Summary routed to PCP and cardiologist.  Pt started in Pulmonary Rehab today.  Initial ITP created and sent to sign by Dr. Sabra Heck, Medical Director.  Documentation for diagnosis can be found in [place] encounter from 02/09/17 from [facility].  Patient is going to use 1 liter of oxygen for exercise.   [place] Name 03/10/17 1017 03/15/17 0824         ITP Comments  [name] is off his oxygen per doctors orders.  30 day review completed. ITP sent to Dr. Emily Filbert Director of [facility]. Continue with ITP unless changes are made by physician.         Comments: Discharge ITP

## 2018-05-22 ENCOUNTER — Encounter: Payer: Self-pay | Admitting: Emergency Medicine

## 2018-05-22 ENCOUNTER — Emergency Department
Admission: EM | Admit: 2018-05-22 | Discharge: 2018-05-22 | Disposition: A | Discharge status: Home / Self Care | Payer: Medicare Other | Attending: Emergency Medicine | Admitting: Emergency Medicine

## 2018-05-22 ENCOUNTER — Emergency Department: Payer: Medicare Other

## 2018-05-22 ENCOUNTER — Other Ambulatory Visit: Payer: Self-pay

## 2018-05-22 DIAGNOSIS — Z7982 Long term (current) use of aspirin: Secondary | ICD-10-CM | POA: Insufficient documentation

## 2018-05-22 DIAGNOSIS — Z87891 Personal history of nicotine dependence: Secondary | ICD-10-CM | POA: Diagnosis not present

## 2018-05-22 DIAGNOSIS — K5900 Constipation, unspecified: Secondary | ICD-10-CM | POA: Insufficient documentation

## 2018-05-22 DIAGNOSIS — Z79899 Other long term (current) drug therapy: Secondary | ICD-10-CM | POA: Diagnosis not present

## 2018-05-22 MED ORDER — SORBITOL 70 % SOLN
960.0000 mL | TOPICAL_OIL | Freq: Once | ORAL | Status: AC
  Administered 2018-05-22: 960 mL via RECTAL
  Filled 2018-05-22: qty 473

## 2018-05-22 MED ORDER — DOCUSATE SODIUM 50 MG/5ML PO LIQD
50.0000 mg | Freq: Once | ORAL | Status: AC
  Administered 2018-05-22: 50 mg via ORAL
  Filled 2018-05-22: qty 10

## 2018-05-22 MED ORDER — MAGNESIUM HYDROXIDE 400 MG/5ML PO SUSP: 30.0000 mL | Freq: Every day | ORAL | 0 refills | Status: AC | PRN

## 2018-05-22 NOTE — ED Notes (Signed)
Pt at imaging. Will complete EKG once back to room.

## 2018-05-22 NOTE — ED Notes (Signed)
Pt attempting to provide urine sample now.  

## 2018-05-22 NOTE — ED Provider Notes (Signed)
[facility] Emergency Department Provider Note  ____________________________________________  Time seen: Approximately 8:17 PM  I have reviewed the triage vital signs and the nursing notes.   HISTORY  Chief Complaint Constipation    HPI Jacob Avila is a 85 y.o. male that presents to the emergency department for evaluation of constipation today.  Patient states that he has not had a bowel movement in 3 days.  He usually has a bowel movement about every 2.5 days.  He has been trying to have a bowel movement this afternoon without any success.  He does not have any abdominal pain.  He feels great otherwise.  No recent illness.  No history of hemorrhoids.  No fever, dizziness, shortness of breath, chest pain, abdominal pain, vomiting.  Past Medical History:  Diagnosis Date  . 1st degree AV block 05/13/2012  . Arteriosclerosis of coronary artery 11/12/2014  . Atrial fibrillation ([place]) 11/30/2016  . BP (high blood pressure) 05/12/2012  . Bradycardia 05/12/2012  . Dizziness 05/12/2012  . HLD (hyperlipidemia)     Patient Active Problem List   Diagnosis Date Noted  . Atrial fibrillation ([place]) 11/30/2016  . CHF (congestive heart failure) ([facility]) 11/03/2016  . Dyspnea on exertion 10/23/2016  . Arteriosclerosis of coronary artery 11/12/2014  . 1st degree AV block 05/13/2012  . Bradycardia 05/12/2012  . Dizziness 05/12/2012  . HLD (hyperlipidemia) 05/12/2012  . BP (high blood pressure) 05/12/2012  . History of cholecystectomy 05/12/2012    Past Surgical History:  Procedure Laterality Date  . CHOLECYSTECTOMY  2008  . SHOULDER SURGERY  2005    Prior to Admission medications   Medication Sig Start Date End Date Taking? Authorizing Provider  aspirin EC 81 MG tablet Take 81 mg by mouth.    [provider]  atorvastatin (LIPITOR) 80 MG tablet Take 80 mg by mouth. 11/07/16 12/07/16  [provider]  clopidogrel (PLAVIX) 75 MG tablet Take 75 mg by  mouth. 11/08/16   [provider]  furosemide (LASIX) 20 MG tablet Take 40 mg by mouth. 10/09/16 01/12/17  [provider]  lisinopril (PRINIVIL,ZESTRIL) 2.5 MG tablet Take 2.5 mg by mouth. 11/08/16   [provider]  magnesium hydroxide (MILK OF MAGNESIA) 400 MG/5ML suspension Take 30 mLs by mouth daily as needed for up to 7 days for mild constipation. 05/22/18 05/29/18  Laban Emperor, PA-C  metoprolol succinate (TOPROL-XL) 50 MG 24 hr tablet Take 50 mg by mouth. 11/13/16 01/12/17  [provider]  omeprazole (PRILOSEC) 20 MG capsule Take 20 mg by mouth. 11/13/16 12/13/16  [provider]  simvastatin (ZOCOR) 40 MG tablet Take 40 mg by mouth. 01/16/10   [provider]    Allergies Patient has no known allergies.  No family history on file.  Social History Social History   Tobacco Use  . Smoking status: Former Smoker    Types: Cigarettes    Last attempt to quit: 01/05/1978    Years since quitting: 40.4  . Smokeless tobacco: Never Used  Substance Use Topics  . Alcohol use: No    Alcohol/week: 0.0 standard drinks  . Drug use: No     Review of Systems  Constitutional: No fever/chills ENT: No upper respiratory complaints. Cardiovascular: No chest pain. Respiratory: No cough. No SOB. Gastrointestinal: No abdominal pain.  No nausea, no vomiting.  Musculoskeletal: Negative for musculoskeletal pain. Skin: Negative for rash, abrasions, lacerations, ecchymosis. Neurological: Negative for headaches   ____________________________________________   PHYSICAL EXAM:  VITAL SIGNS: ED  Triage Vitals  Enc Vitals Group     BP 05/22/18 1211 100/63     Pulse Rate 05/22/18 1211 (!) 114     Resp 05/22/18 1211 16     Temp 05/22/18 1211 98.1 F (36.7 C)     Temp Source 05/22/18 1211 Oral     SpO2 05/22/18 1211 99 %     Weight --      Height --      Head Circumference --      Peak Flow --      Pain Score 05/22/18 1212 0     Pain Loc --       Pain Edu? --      Excl. in [place]? --      Constitutional: Alert and oriented. Well appearing and in no acute distress. Eyes: Conjunctivae are normal. PERRL. EOMI. Head: Atraumatic. ENT:      Ears:      Nose: No congestion/rhinnorhea.      Mouth/Throat: Mucous membranes are moist.  Neck: No stridor.   Cardiovascular: Normal rate.  Good peripheral circulation. Respiratory: Normal respiratory effort without tachypnea or retractions. Lungs CTAB. Good air entry to the bases with no decreased or absent breath sounds. Gastrointestinal: Bowel sounds 4 quadrants. Soft and nontender to palpation. No guarding or rigidity. No palpable masses. No distention.  Rectal: No visible hemorrhoids.  No large hard stool burden felt. Musculoskeletal: Full range of motion to all extremities. No gross deformities appreciated. Neurologic:  Normal speech and language. No gross focal neurologic deficits are appreciated.  Skin:  Skin is warm, dry and intact. No rash noted. Psychiatric: Mood and affect are normal. Speech and behavior are normal. Patient exhibits appropriate insight and judgement.   ____________________________________________   LABS (all labs ordered are listed, but only abnormal results are displayed)  Labs Reviewed - No data to display ____________________________________________  EKG   ____________________________________________  RADIOLOGY Robinette Haines, personally viewed and evaluated these images (plain radiographs) as part of my medical decision making, as well as reviewing the written report by the radiologist.  Dg Abdomen 1 View  Result Date: 05/22/2018 CLINICAL DATA:  Constipation for 3-4 days. EXAM: ABDOMEN - 1 VIEW COMPARISON:  11/20/2014 FINDINGS: There is a moderate stool burden identified throughout the colon up to the rectum. No dilated loops of small bowel or air-fluid levels. IMPRESSION: 1. Nonobstructive bowel gas pattern. 2. Moderate stool burden within the colon  compatible with clinical history of constipation. Electronically Signed   By: Kerby Moors M.D.   On: 05/22/2018 14:48    ____________________________________________    PROCEDURES  Procedure(s) performed:    Procedures    Medications  sorbitol, milk of mag, mineral oil, glycerin (SMOG) enema (960 mLs Rectal Given 05/22/18 1729)  docusate (COLACE) 50 MG/5ML liquid 50 mg (50 mg Oral Given 05/22/18 1942)     ____________________________________________   INITIAL IMPRESSION / ASSESSMENT AND PLAN / ED COURSE  Pertinent labs & imaging results that were available during my care of the patient were reviewed by me and considered in my medical decision making (see chart for details).  Review of the [place] CSRS was performed in accordance of the [place] prior to dispensing any controlled drugs.     Patient presented to emergency department for evaluation of constipation.  Abdominal x-ray consistent with constipation.  Patient was mildly tachycardic when he arrived in the emergency department.  EKG shows A. fib with a rate in the 70s.  Patient has a history of  Afib and is followed by cardiology for this.  Last visit was 3 month ago and it is noted that his rate is controlled with medications and he is compliant with these medications.  HR  in the emergency department was between 70s and low 90s.  He denies any chest pain, palpitations, shortness of breath.  He denies any pain at all.  He was given an enema in the emergency department with some mild success.  Disimpaction was attempted without success.  He was given a dose of Colace in the emergency department.  Patient is asymptomatic and just has not had a bowel movement in 3 days.  His regular is a bowel movement every 2.5 days.  Dr. Quentin [facility] has evaluated the patient and agrees with plan of care.  Patient will follow-up with primary care.  He will pick up some prune juice on his way home.  He will return to the emergency department for evaluation  if he develops any symptoms at all.  Jacob Avila was evaluated in Emergency Department on 05/22/2018 for the symptoms described in the history of present illness. He was evaluated in the context of the global COVID-19 pandemic, which necessitated consideration that the patient might be at risk for infection with the SARS-CoV-2 virus that causes COVID-19. Institutional protocols and algorithms that pertain to the evaluation of patients at risk for COVID-19 are in a state of rapid change based on information released by regulatory bodies including the CDC and federal and state organizations. These policies and algorithms were followed during the patient's care in the ED.     ____________________________________________  FINAL CLINICAL IMPRESSION(S) / ED DIAGNOSES  Final diagnoses:  Constipation, unspecified constipation type      NEW MEDICATIONS STARTED DURING THIS VISIT:  ED Discharge Orders         Ordered    magnesium hydroxide (MILK OF MAGNESIA) 400 MG/5ML suspension  Daily PRN     05/22/18 1921              This chart was dictated using voice recognition software/Dragon. Despite best efforts to proofread, errors can occur which can change the meaning. Any change was purely unintentional.    Laban Emperor, PA-C 05/22/18 2103    Merlyn Lot, MD 05/26/18 1459

## 2018-05-22 NOTE — ED Notes (Signed)
Pt on bedside toilet attempting to have BM again. Small amounts of loose BM on floor around pt as he couldn't make it to toilet fast enough. Cleaned off floor. Daughter called this RN wanting update but pt would not give verbal okay to update so daughter not updated. Pt given personal phone off table as requested. Pt frustrated and hungry. Will give food tray with PA/MD okay.

## 2018-05-22 NOTE — ED Notes (Signed)
Pt given food tray while waiting on colace to arrive from pharmacy.

## 2018-05-22 NOTE — ED Notes (Signed)
Pt back to room. Brooke, RN to complete EKG now.

## 2018-05-22 NOTE — Discharge Instructions (Addendum)
Patient's abdominal x-ray shows that he does have some constipation.  Please pick up some prune juice on your way home.  He was given an enema in the emergency department and some medication for constipation.  He can take milk of magnesia daily for constipation.  Please call primary care tomorrow for a follow-up appointment this week.  Please return emergency department immediately if he develops any chest pain, shortness of breath, abdominal pain.

## 2018-05-22 NOTE — ED Notes (Signed)
Pt attempted to have BM without success. Pt understood urine sample was needed but pt couldn't urinate when he tried 1st time. Then when pt attempted for BM he urinated into toilet. Pt understands that urine sample is still needed in urinal.

## 2018-05-22 NOTE — ED Notes (Signed)
Pt's bed pulled next to toilet. Pt will attempt to have BM now. If still unable to, will let PA know.

## 2018-05-22 NOTE — ED Triage Notes (Signed)
Pt to ED via POV c/o constipation. Pt states that he has not had a bowel movement in 3-4 days. Pt denies abdominal pain. Pt is in NAD.

## 2018-05-22 NOTE — ED Notes (Signed)
Called pharm; states will send colace soon. Pt aware.

## 2018-06-08 ENCOUNTER — Emergency Department: Payer: Medicare Other

## 2018-06-08 ENCOUNTER — Inpatient Hospital Stay
Admission: EM | Admit: 2018-06-08 | Discharge: 2018-06-10 | DRG: 683 | Disposition: A | Discharge status: Home / Self Care | Payer: Medicare Other | Attending: Specialist | Admitting: Specialist

## 2018-06-08 ENCOUNTER — Other Ambulatory Visit: Payer: Self-pay

## 2018-06-08 ENCOUNTER — Encounter: Payer: Self-pay | Admitting: Emergency Medicine

## 2018-06-08 DIAGNOSIS — Z7982 Long term (current) use of aspirin: Secondary | ICD-10-CM

## 2018-06-08 DIAGNOSIS — E875 Hyperkalemia: Secondary | ICD-10-CM | POA: Diagnosis present

## 2018-06-08 DIAGNOSIS — I951 Orthostatic hypotension: Secondary | ICD-10-CM | POA: Diagnosis present

## 2018-06-08 DIAGNOSIS — R338 Other retention of urine: Secondary | ICD-10-CM | POA: Diagnosis present

## 2018-06-08 DIAGNOSIS — N179 Acute kidney failure, unspecified: Principal | ICD-10-CM | POA: Diagnosis present

## 2018-06-08 DIAGNOSIS — I5022 Chronic systolic (congestive) heart failure: Secondary | ICD-10-CM | POA: Diagnosis present

## 2018-06-08 DIAGNOSIS — Z9049 Acquired absence of other specified parts of digestive tract: Secondary | ICD-10-CM

## 2018-06-08 DIAGNOSIS — E785 Hyperlipidemia, unspecified: Secondary | ICD-10-CM | POA: Diagnosis present

## 2018-06-08 DIAGNOSIS — I2510 Atherosclerotic heart disease of native coronary artery without angina pectoris: Secondary | ICD-10-CM | POA: Diagnosis present

## 2018-06-08 DIAGNOSIS — E860 Dehydration: Secondary | ICD-10-CM | POA: Diagnosis present

## 2018-06-08 DIAGNOSIS — I429 Cardiomyopathy, unspecified: Secondary | ICD-10-CM | POA: Diagnosis present

## 2018-06-08 DIAGNOSIS — R4182 Altered mental status, unspecified: Secondary | ICD-10-CM

## 2018-06-08 DIAGNOSIS — N401 Enlarged prostate with lower urinary tract symptoms: Secondary | ICD-10-CM | POA: Diagnosis present

## 2018-06-08 DIAGNOSIS — Z7902 Long term (current) use of antithrombotics/antiplatelets: Secondary | ICD-10-CM | POA: Diagnosis not present

## 2018-06-08 DIAGNOSIS — X30XXXA Exposure to excessive natural heat, initial encounter: Secondary | ICD-10-CM | POA: Diagnosis not present

## 2018-06-08 DIAGNOSIS — Z79899 Other long term (current) drug therapy: Secondary | ICD-10-CM | POA: Diagnosis not present

## 2018-06-08 DIAGNOSIS — Z7901 Long term (current) use of anticoagulants: Secondary | ICD-10-CM

## 2018-06-08 DIAGNOSIS — Z87891 Personal history of nicotine dependence: Secondary | ICD-10-CM | POA: Diagnosis not present

## 2018-06-08 DIAGNOSIS — T675XXA Heat exhaustion, unspecified, initial encounter: Secondary | ICD-10-CM | POA: Diagnosis present

## 2018-06-08 DIAGNOSIS — I440 Atrioventricular block, first degree: Secondary | ICD-10-CM | POA: Diagnosis present

## 2018-06-08 DIAGNOSIS — Z1159 Encounter for screening for other viral diseases: Secondary | ICD-10-CM | POA: Diagnosis not present

## 2018-06-08 DIAGNOSIS — R55 Syncope and collapse: Secondary | ICD-10-CM | POA: Diagnosis present

## 2018-06-08 DIAGNOSIS — E872 Acidosis: Secondary | ICD-10-CM | POA: Diagnosis present

## 2018-06-08 DIAGNOSIS — R680 Hypothermia, not associated with low environmental temperature: Secondary | ICD-10-CM | POA: Diagnosis present

## 2018-06-08 DIAGNOSIS — I110 Hypertensive heart disease with heart failure: Secondary | ICD-10-CM | POA: Diagnosis present

## 2018-06-08 DIAGNOSIS — I480 Paroxysmal atrial fibrillation: Secondary | ICD-10-CM | POA: Diagnosis present

## 2018-06-08 DIAGNOSIS — R319 Hematuria, unspecified: Secondary | ICD-10-CM | POA: Diagnosis not present

## 2018-06-08 LAB — COMPREHENSIVE METABOLIC PANEL
ALT: 23 U/L (ref 0–44)
AST: 27 U/L (ref 15–41)
Albumin: 4.1 g/dL (ref 3.5–5.0)
Alkaline Phosphatase: 90 U/L (ref 38–126)
Anion gap: 13 (ref 5–15)
BUN: 31 mg/dL — ABNORMAL HIGH (ref 8–23)
CO2: 19 mmol/L — ABNORMAL LOW (ref 22–32)
Calcium: 9.3 mg/dL (ref 8.9–10.3)
Chloride: 105 mmol/L (ref 98–111)
Creatinine, Ser: 1.37 mg/dL — ABNORMAL HIGH (ref 0.61–1.24)
GFR calc Af Amer: 53 mL/min — ABNORMAL LOW (ref >60)
GFR calc non Af Amer: 46 mL/min — ABNORMAL LOW (ref >60)
Glucose, Bld: 101 mg/dL — ABNORMAL HIGH (ref 70–99)
Potassium: 5.5 mmol/L — ABNORMAL HIGH (ref 3.5–5.1)
Sodium: 137 mmol/L (ref 135–145)
Total Bilirubin: 0.6 mg/dL (ref 0.3–1.2)
Total Protein: 7.7 g/dL (ref 6.5–8.1)

## 2018-06-08 LAB — CBC WITH DIFFERENTIAL/PLATELET
Abs Immature Granulocytes: 0.09 10*3/uL — ABNORMAL HIGH (ref 0.00–0.07)
Basophils Absolute: 0.1 10*3/uL (ref 0.0–0.1)
Basophils Relative: 0 %
Eosinophils Absolute: 0 10*3/uL (ref 0.0–0.5)
Eosinophils Relative: 0 %
HCT: 48.1 % (ref 39.0–52.0)
Hemoglobin: 15.1 g/dL (ref 13.0–17.0)
Immature Granulocytes: 1 %
Lymphocytes Relative: 9 %
Lymphs Abs: 1.2 10*3/uL (ref 0.7–4.0)
MCH: 26.5 pg (ref 26.0–34.0)
MCHC: 31.4 g/dL (ref 30.0–36.0)
MCV: 84.4 fL (ref 80.0–100.0)
Monocytes Absolute: 0.5 10*3/uL (ref 0.1–1.0)
Monocytes Relative: 4 %
Neutro Abs: 11.2 10*3/uL — ABNORMAL HIGH (ref 1.7–7.7)
Neutrophils Relative %: 86 %
Platelets: 164 10*3/uL (ref 150–400)
RBC: 5.7 MIL/uL (ref 4.22–5.81)
RDW: 14.1 % (ref 11.5–15.5)
WBC: 13.1 10*3/uL — ABNORMAL HIGH (ref 4.0–10.5)
nRBC: 0 % (ref 0.0–0.2)

## 2018-06-08 LAB — LACTIC ACID, PLASMA
Lactic Acid, Venous: 2.4 mmol/L (ref 0.5–1.9)
Lactic Acid, Venous: 3.7 mmol/L (ref 0.5–1.9)
Lactic Acid, Venous: 2.1 mmol/L (ref 0.5–1.9)

## 2018-06-08 LAB — URINALYSIS, COMPLETE (UACMP) WITH MICROSCOPIC
Bacteria, UA: NONE SEEN
Bilirubin Urine: NEGATIVE
Glucose, UA: NEGATIVE mg/dL
Hgb urine dipstick: NEGATIVE
Ketones, ur: NEGATIVE mg/dL
Leukocytes,Ua: NEGATIVE
Nitrite: NEGATIVE
Protein, ur: NEGATIVE mg/dL
Specific Gravity, Urine: 1.013 (ref 1.005–1.030)
Squamous Epithelial / HPF: NONE SEEN (ref 0–5)
pH: 5 (ref 5.0–8.0)

## 2018-06-08 LAB — CK: Total CK: 95 U/L (ref 49–397)

## 2018-06-08 LAB — BLOOD GAS, VENOUS
Acid-base deficit: 1.4 mmol/L (ref 0.0–2.0)
Bicarbonate: 22.8 mmol/L (ref 20.0–28.0)
O2 Saturation: 38.1 %
Patient temperature: 37
pCO2, Ven: 36 mmHg — ABNORMAL LOW (ref 44.0–60.0)
pH, Ven: 7.41 (ref 7.250–7.430)

## 2018-06-08 LAB — BASIC METABOLIC PANEL
Anion gap: 10 (ref 5–15)
BUN: 31 mg/dL — ABNORMAL HIGH (ref 8–23)
CO2: 23 mmol/L (ref 22–32)
Calcium: 8.9 mg/dL (ref 8.9–10.3)
Chloride: 105 mmol/L (ref 98–111)
Creatinine, Ser: 1.29 mg/dL — ABNORMAL HIGH (ref 0.61–1.24)
GFR calc Af Amer: 57 mL/min — ABNORMAL LOW (ref >60)
GFR calc non Af Amer: 49 mL/min — ABNORMAL LOW (ref >60)
Glucose, Bld: 118 mg/dL — ABNORMAL HIGH (ref 70–99)
Potassium: 5.2 mmol/L — ABNORMAL HIGH (ref 3.5–5.1)
Sodium: 138 mmol/L (ref 135–145)

## 2018-06-08 LAB — SARS CORONAVIRUS 2 BY RT PCR (HOSPITAL ORDER, PERFORMED IN ~~LOC~~ HOSPITAL LAB): SARS Coronavirus 2: NEGATIVE

## 2018-06-08 LAB — GLUCOSE, CAPILLARY: Glucose-Capillary: 92 mg/dL (ref 70–99)

## 2018-06-08 LAB — TROPONIN I: Troponin I: <0.03 ng/mL (ref <0.03)

## 2018-06-08 LAB — ETHANOL: Alcohol, Ethyl (B): <10 mg/dL (ref <10)

## 2018-06-08 LAB — APTT: aPTT: 35 seconds (ref 24–36)

## 2018-06-08 LAB — PROTIME-INR
INR: 1.6 — ABNORMAL HIGH (ref 0.8–1.2)
Prothrombin Time: 18.8 seconds — ABNORMAL HIGH (ref 11.4–15.2)

## 2018-06-08 LAB — MAGNESIUM: Magnesium: 2.2 mg/dL (ref 1.7–2.4)

## 2018-06-08 MED ORDER — SODIUM CHLORIDE 0.9 % IV BOLUS
250.0000 mL | Freq: Once | INTRAVENOUS | Status: AC
  Administered 2018-06-08: 250 mL via INTRAVENOUS

## 2018-06-08 MED ORDER — ACETAMINOPHEN 325 MG PO TABS: 650.0000 mg | ORAL_TABLET | Freq: Four times a day (QID) | ORAL | Status: DC | PRN

## 2018-06-08 MED ORDER — SODIUM CHLORIDE 0.9 % IV SOLN
Freq: Once | INTRAVENOUS | Status: AC
  Administered 2018-06-08: 16:00:00 via INTRAVENOUS

## 2018-06-08 MED ORDER — ONDANSETRON HCL 4 MG PO TABS: 4.0000 mg | ORAL_TABLET | Freq: Four times a day (QID) | ORAL | Status: DC | PRN

## 2018-06-08 MED ORDER — SODIUM CHLORIDE 0.9% FLUSH
3.0000 mL | Freq: Two times a day (BID) | INTRAVENOUS | Status: DC
  Administered 2018-06-08 – 2018-06-10 (×4): 3 mL via INTRAVENOUS

## 2018-06-08 MED ORDER — SODIUM CHLORIDE 0.9 % IV BOLUS: 500.0000 mL | Freq: Once | INTRAVENOUS | Status: DC

## 2018-06-08 MED ORDER — ONDANSETRON HCL 4 MG/2ML IJ SOLN
INTRAMUSCULAR | Status: AC
  Filled 2018-06-08: qty 2

## 2018-06-08 MED ORDER — ACETAMINOPHEN 650 MG RE SUPP: 650.0000 mg | Freq: Four times a day (QID) | RECTAL | Status: DC | PRN

## 2018-06-08 MED ORDER — METOPROLOL SUCCINATE ER 50 MG PO TB24
50.0000 mg | ORAL_TABLET | Freq: Every day | ORAL | Status: DC
  Administered 2018-06-09 – 2018-06-10 (×2): 50 mg via ORAL
  Filled 2018-06-08 (×2): qty 1

## 2018-06-08 MED ORDER — POLYETHYLENE GLYCOL 3350 17 G PO PACK: 17.0000 g | PACK | Freq: Every day | ORAL | Status: DC | PRN

## 2018-06-08 MED ORDER — PROMETHAZINE HCL 25 MG/ML IJ SOLN
6.2500 mg | Freq: Once | INTRAMUSCULAR | Status: AC
  Administered 2018-06-08: 6.25 mg via INTRAVENOUS

## 2018-06-08 MED ORDER — SODIUM CHLORIDE 0.9 % IV SOLN
2.0000 g | Freq: Once | INTRAVENOUS | Status: AC
  Administered 2018-06-08: 2 g via INTRAVENOUS
  Filled 2018-06-08: qty 2

## 2018-06-08 MED ORDER — METRONIDAZOLE IN NACL 5-0.79 MG/ML-% IV SOLN
500.0000 mg | Freq: Once | INTRAVENOUS | Status: DC
  Administered 2018-06-08: 500 mg via INTRAVENOUS
  Filled 2018-06-08: qty 100

## 2018-06-08 MED ORDER — RIVAROXABAN 15 MG PO TABS
15.0000 mg | ORAL_TABLET | Freq: Every day | ORAL | Status: DC
  Administered 2018-06-09 – 2018-06-10 (×2): 15 mg via ORAL
  Filled 2018-06-08 (×2): qty 1

## 2018-06-08 MED ORDER — ONDANSETRON HCL 4 MG/2ML IJ SOLN: 4.0000 mg | Freq: Four times a day (QID) | INTRAMUSCULAR | Status: DC | PRN

## 2018-06-08 MED ORDER — VANCOMYCIN HCL IN DEXTROSE 1-5 GM/200ML-% IV SOLN
1000.0000 mg | Freq: Once | INTRAVENOUS | Status: AC
  Administered 2018-06-08: 1000 mg via INTRAVENOUS
  Filled 2018-06-08: qty 200

## 2018-06-08 MED ORDER — ENOXAPARIN SODIUM 40 MG/0.4ML ~~LOC~~ SOLN: 40.0000 mg | SUBCUTANEOUS | Status: DC

## 2018-06-08 MED ORDER — SODIUM CHLORIDE 0.9 % IV SOLN: INTRAVENOUS | Status: AC

## 2018-06-08 NOTE — ED Provider Notes (Addendum)
[facility] Emergency Department Provider Note  ____________________________________________   I have reviewed the triage vital signs and the nursing notes. Where available I have reviewed prior notes and, if possible and indicated, outside hospital notes.    HISTORY  Chief Complaint unresponsive    HPI Jacob Avila is a 85 y.o. male patient seen and evaluated during the coronavirus epidemic during a time with low staffing, brought in by EMS emergency traffic, history is not available from patient.  Level 5 chart caveat; no further history available due to patient status.  I was able to talk to his wife as well in between EMS and his wife this is the history that presents.  Patient was out mowing the lawn on the hottest day of the year so far.  He does have a history of significant CHF and stents.  His last EF was in the 20s at [facility] had an echo I have reviewed from late last year.  The patient has had some orthostatic times and was weaned down on his Lasix in February.  He does live in a house with air conditioning.  His daughter talks to him every day but he lives by himself.  He is not supposed to mow the lawn.  According to his daughter, he has been feeling "a little poorly with the heat" over the last 3 or 4 days but no particular complaints of fever cough chest pain shortness of breath nausea vomiting or anything else that was identified.  Today, he actually called his daughter, she talked to him this morning he said he is feeling "a little poorly" and then he talked to his daughter this afternoon and said that she had better get over there because he was not feeling well.  She had cousins that live next door and they went immediately over and found him on the ground, no evidence of trauma.  He was confused.  EMS arrived his pressures were in the 60s, and they gave him IV fluids, patient does have a history of atrial fibrillation and is on Xarelto, EMS reports no  evidence of trauma.  Patient was initially minimally responsive for them but became more responsive on the way in.  He had had some emesis.  They gave him Zofran, and a liter of fluid.  The patient did not complain of chest pain and he became more responsive as they traveled.  He was diaphoretic, and found in an air conditioned house it is not clear how long he had been inside.  Clearly though he had been mowing the lawn immediately before this happened.  He is a full code.  No coronavirus symptoms no known exposures.  Past Medical History:  Diagnosis Date  . 1st degree AV block 05/13/2012  . Arteriosclerosis of coronary artery 11/12/2014  . Atrial fibrillation ([place]) 11/30/2016  . BP (high blood pressure) 05/12/2012  . Bradycardia 05/12/2012  . Dizziness 05/12/2012  . HLD (hyperlipidemia)     Patient Active Problem List   Diagnosis Date Noted  . Atrial fibrillation ([place]) 11/30/2016  . CHF (congestive heart failure) ([place]) 11/03/2016  . Dyspnea on exertion 10/23/2016  . Arteriosclerosis of coronary artery 11/12/2014  . 1st degree AV block 05/13/2012  . Bradycardia 05/12/2012  . Dizziness 05/12/2012  . HLD (hyperlipidemia) 05/12/2012  . BP (high blood pressure) 05/12/2012  . History of cholecystectomy 05/12/2012    Past Surgical History:  Procedure Laterality Date  . CHOLECYSTECTOMY  2008  . SHOULDER SURGERY  2005  Prior to Admission medications   Medication Sig Start Date End Date Taking? Authorizing Provider  aspirin EC 81 MG tablet Take 81 mg by mouth.    [provider]  atorvastatin (LIPITOR) 80 MG tablet Take 80 mg by mouth. 11/07/16 12/07/16  [provider]  clopidogrel (PLAVIX) 75 MG tablet Take 75 mg by mouth. 11/08/16   [provider]  furosemide (LASIX) 20 MG tablet Take 40 mg by mouth. 10/09/16 01/12/17  [provider]  lisinopril (PRINIVIL,ZESTRIL) 2.5 MG tablet Take 2.5 mg by mouth. 11/08/16   [provider]  metoprolol succinate  (TOPROL-XL) 50 MG 24 hr tablet Take 50 mg by mouth. 11/13/16 01/12/17  [provider]  omeprazole (PRILOSEC) 20 MG capsule Take 20 mg by mouth. 11/13/16 12/13/16  [provider]  simvastatin (ZOCOR) 40 MG tablet Take 40 mg by mouth. 01/16/10   [provider]    Allergies Patient has no known allergies.  History reviewed. No pertinent family history.  Social History Social History   Tobacco Use  . Smoking status: Former Smoker    Types: Cigarettes    Last attempt to quit: 01/05/1978    Years since quitting: 40.4  . Smokeless tobacco: Never Used  Substance Use Topics  . Alcohol use: No    Alcohol/week: 0.0 standard drinks  . Drug use: No    Review of Systems Level 5 chart caveat; no further history available due to patient status.   ____________________________________________   PHYSICAL EXAM:  VITAL SIGNS: ED Triage Vitals  Enc Vitals Group     BP --      Pulse --      Resp --      Temp 06/08/18 1604 (!) 96.1 F (35.6 C)     Temp Source 06/08/18 1604 Rectal     SpO2 --      Weight --      Height 06/08/18 1557 6' (1.829 m)     Head Circumference --      Peak Flow --      Pain Score --      Pain Loc --      Pain Edu? --      Excl. in [place]? --     Constitutional: he is awake, will respond, did tell me his name, tell me he is not having pain, he is subdued however he did vomit as well he was able to sit up and grab the back.  I do not think intubation is imminently needed for airway protection. Eyes: Conjunctivae are normal as are reactive, 3-2 Head: Atraumatic HEENT: No congestion/rhinnorhea. Mucous membranes are moist.  Oropharynx non-erythematous Neck:   Nontender with no meningismus, no masses, no stridor Cardiovascular: Normal rate, regular rhythm. Grossly normal heart sounds.  Good peripheral circulation. Respiratory: Normal respiratory effort.  No retractions. Lungs CTAB. Abdominal: Soft and nontender. No distention. No guarding no  rebound Back:  There is no focal tenderness or step off.  there is no midline tenderness there are no lesions noted. there is no CVA tenderness Musculoskeletal: No lower extremity tenderness, no upper extremity tenderness. No joint effusions, no DVT signs strong distal pulses no edema Neurologic: Neurologic exam but no evidence of focal deficit noted. Skin:  Skin is warm, dry and intact. No rash noted. Psychiatric: Mood and affect are normal. Speech and behavior are normal.  ____________________________________________   LABS (all labs ordered are listed, but only abnormal results are displayed)  Labs Reviewed  BLOOD GAS, VENOUS -  Abnormal; Notable for the following components:      Result Value   pCO2, Ven 36 (*)    All other components within normal limits  CULTURE, BLOOD (ROUTINE X 2)  CULTURE, BLOOD (ROUTINE X 2)  URINE CULTURE  SARS CORONAVIRUS 2 (HOSPITAL ORDER, [place] [facility])  GLUCOSE, CAPILLARY  CBC WITH DIFFERENTIAL/PLATELET  TROPONIN I  LACTIC ACID, PLASMA  LACTIC ACID, PLASMA  COMPREHENSIVE METABOLIC PANEL  URINALYSIS, COMPLETE (UACMP) WITH MICROSCOPIC  ETHANOL  PROTIME-INR  CK  MAGNESIUM  APTT    Pertinent labs  results that were available during my care of the patient were reviewed by me and considered in my medical decision making (see chart for details). ____________________________________________  EKG  I personally interpreted any EKGs ordered by me or triage Atrial fibrillation rate 60 bpm, PVCs noted no acute ST elevation or depression, normal axis. ____________________________________________  RADIOLOGY  Pertinent labs & imaging results that were available during my care of the patient were reviewed by me and considered in my medical decision making (see chart for details). If possible, patient and/or family made aware of any abnormal findings.  No results found. ____________________________________________     PROCEDURES  Procedure(s) performed: None  Procedures  Critical Care performed: CRITICAL CARE Performed by: Schuyler Amor   Total critical care time: 49 minutes  Critical care time was exclusive of separately billable procedures and treating other patients.  Critical care was necessary to treat or prevent imminent or life-threatening deterioration.  Critical care was time spent personally by me on the following activities: development of treatment plan with patient and/or surrogate as well as nursing, discussions with consultants, evaluation of patient's response to treatment, examination of patient, obtaining history from patient or surrogate, ordering and performing treatments and interventions, ordering and review of laboratory studies, ordering and review of radiographic studies, pulse oximetry and re-evaluation of patient's condition.   ____________________________________________   INITIAL IMPRESSION / ASSESSMENT AND PLAN / ED COURSE  Pertinent labs & imaging results that were available during my care of the patient were reviewed by me and considered in my medical decision making (see chart for details).  Patient brought in with hypotension diaphoresis mental status after mowing the lawn at a very high heat index day, this is most likely heat exhaustion.  Core temperature is low, however, after becoming very sweaty he was lying in a cold air conditioned house for some time and is unclear if this is a secondary to evaporative cooling or some septic process.  Accordingly we are getting cultures, chest x-ray I will CT scan of his head given his history of blood thinner use, we will check CBC, CMP EtOH, we will give him ginger IV fluid because of his history of significant CHF and the fact that he already had a 1100 cc bolus from EMS.  No evidence of ST EMI.  We will send cardiac enzymes.  We are giving him antiemetics.  We have placed a Foley temperature gauge, we will monitor  his ins and outs and we will continue to be very closely vigilant about the need for possible intubation if he gets more altered.  Lungs are clear chest x-ray to my read is reassuring and his sats are 100% on 2 L.  VBG is reassuring as well.  ----------------------------------------- 4:32 PM on 06/08/2018 -----------------------------------------  Awake and alert and talking to me, looking much better better color.  ----------------------------------------- 5:13 PM on 06/08/2018 -----------------------------------------  Patient awake, alert in  no acute distress, conversing easily with me no longer nauseated, states that he was out mowing the lawn, got very hot got very lightheaded got sweaty from the heat outside came in and passed out "a little bit I guess".  No complaints of pain, he looks completely well at this time, likely heat exhaustion coupled with poor EF and diuretics resulted in the symptoms.  Nothing at this time to suggest infection.  CT scan of the abdomen pelvis done for persistent vomiting and unclear history is reassuring, we will see what his CT head shows.  I suspect it will be negative, as he looks now.  We will then reassess patient would prefer not to be admitted clearly he has responded very well to EMS resuscitation principally, I am not giving him significant fluids here we will continue to evaluate.  ----------------------------------------- 6:10 PM on 06/08/2018 -----------------------------------------  Lactic noted, I still think patient most likely has not had a heat reaction, however, we will give him broad-spectrum antibiotics, cultures pending.  No clear source of antibiotic noted.  Potassium is borderline elevated but no EKG changes may have the fluid will help that which we are giving him.  Creatinine is up from 3 years ago, most recent creatinine here.  His creatinine at [facility] was normal in August 2019 but that has not been checked since that time that I can see.   Patient likely will need an observational stay.  We will talk to the family about this.    ____________________________________________   FINAL CLINICAL IMPRESSION(S) / ED DIAGNOSES  Final diagnoses:  Altered mental status      This chart was dictated using voice recognition software.  Despite best efforts to proofread,  errors can occur which can change meaning.      Schuyler Amor, MD 06/08/18 1623    Schuyler Amor, MD 06/08/18 [id number]    Schuyler Amor, MD 06/08/18 1715    Schuyler Amor, MD 06/08/18 1810    Schuyler Amor, MD 06/08/18 [id number]

## 2018-06-08 NOTE — ED Notes (Signed)
.. ED TO INPATIENT HANDOFF REPORT  ED Nurse Name and Phone #: Deneise Lever [phone]  S Name/Age/Gender Jacob Avila 85 y.o. male Room/Bed: ED03A/ED03A  Code Status   Code Status: Not on file  Home/SNF/Other Home Patient oriented to: self, place, time and situation Is this baseline? Yes   Triage Complete: Triage complete  Chief Complaint Unresponsive  Triage Note Pt presents from home via acems. upon ems arrival pt was unresponsive laying in floor of living room. Family thinks pt was outside mowing earlier. Initially pt had no radial pulses or bp when ems arrived. 1100 of fluid, 4mg  of zofran given in route. BP of 130/79 after fluid. Junctional rhythm on monitor for ems. HR 40 initially, came up to 60. that came up to 157 sugar.  Hx of a-fib and one stent placement. 18G placed in each AC by ems. MD McShane at bedside.   Allergies No Known Allergies  Level of Care/Admitting Diagnosis ED Disposition    ED Disposition Condition Comment   Admit  The patient appears reasonably stabilized for admission considering the current resources, flow, and capabilities available in the ED at this time, and I doubt any other [facility] requiring further screening and/or treatment in the ED prior to admission is  present.       B Medical/Surgery History Past Medical History:  Diagnosis Date  . 1st degree AV block 05/13/2012  . Arteriosclerosis of coronary artery 11/12/2014  . Atrial fibrillation ([place]) 11/30/2016  . BP (high blood pressure) 05/12/2012  . Bradycardia 05/12/2012  . Dizziness 05/12/2012  . HLD (hyperlipidemia)    Past Surgical History:  Procedure Laterality Date  . CHOLECYSTECTOMY  2008  . SHOULDER SURGERY  2005     A IV Location/Drains/Wounds Patient Lines/Drains/Airways Status   Active Line/Drains/Airways    Name:   Placement date:   Placement time:   Site:   Days:   Peripheral IV 06/08/18 Left Forearm   06/08/18    1600    Forearm   less than 1   Peripheral IV 06/08/18 Right  Forearm   06/08/18    1600    Forearm   less than 1   Peripheral IV 06/08/18 Right Hand   06/08/18    1809    Hand   less than 1   Urethral Catheter [place], RN Temperature probe;Straight-tip 16 Fr.   06/08/18    1610    Temperature probe;Straight-tip   less than 1          Intake/Output Last 24 hours  Intake/Output Summary (Last 24 hours) at 06/08/2018 1845 Last data filed at 06/08/2018 1841 Gross per 24 hour  Intake 250 ml  Output -  Net 250 ml    Labs/Imaging Results for orders placed or performed during the hospital encounter of 06/08/18 (from the past 48 hour(s))  Lactic acid, plasma     Status: Abnormal   Collection Time: 06/08/18  3:54 PM  Result Value Ref Range   Lactic Acid, Venous 2.4 (HH) 0.5 - 1.9 mmol/L    Comment: CRITICAL RESULT CALLED TO, READ BACK BY AND VERIFIED WITH Jaquell Seddon @1627  06/08/18 MJU Performed at [facility], [address] [place], [place] [address]   Urinalysis, Complete w Microscopic     Status: Abnormal   Collection Time: 06/08/18  3:54 PM  Result Value Ref Range   Color, Urine YELLOW (A) YELLOW   APPearance CLEAR (A) CLEAR   Specific Gravity, Urine 1.013 1.005 - 1.030   pH  5.0 5.0 - 8.0   Glucose, UA NEGATIVE NEGATIVE mg/dL   Hgb urine dipstick NEGATIVE NEGATIVE   Bilirubin Urine NEGATIVE NEGATIVE   Ketones, ur NEGATIVE NEGATIVE mg/dL   Protein, ur NEGATIVE NEGATIVE mg/dL   Nitrite NEGATIVE NEGATIVE   Leukocytes,Ua NEGATIVE NEGATIVE   RBC / HPF 0-5 0 - 5 RBC/hpf   WBC, UA 0-5 0 - 5 WBC/hpf   Bacteria, UA NONE SEEN NONE SEEN   Squamous Epithelial / LPF NONE SEEN 0 - 5   Mucus PRESENT     Comment: Performed at [facility], [address] [place], [place] [address]  Protime-INR     Status: Abnormal   Collection Time: 06/08/18  3:54 PM  Result Value Ref Range   Prothrombin Time 18.8 (H) 11.4 - 15.2 seconds   INR 1.6 (H) 0.8 - 1.2    Comment: (NOTE) INR goal varies based on device and disease states. Performed at  [facility], [address] [place], [place] [address]   CK     Status: None   Collection Time: 06/08/18  3:54 PM  Result Value Ref Range   Total CK 95 49 - 397 U/L    Comment: HEMOLYSIS AT THIS LEVEL MAY AFFECT RESULT Performed at [facility], [address], [place], [place] [address]   Magnesium     Status: None   Collection Time: 06/08/18  3:54 PM  Result Value Ref Range   Magnesium 2.2 1.7 - 2.4 mg/dL    Comment: Performed at [facility], [address] [place], [place] [address]  APTT     Status: None   Collection Time: 06/08/18  3:54 PM  Result Value Ref Range   aPTT 35 24 - 36 seconds    Comment: Performed at [facility], [address] [place], [place] [address]  Glucose, capillary     Status: None   Collection Time: 06/08/18  3:56 PM  Result Value Ref Range   Glucose-Capillary 92 70 - 99 mg/dL  Blood gas, venous     Status: Abnormal   Collection Time: 06/08/18  3:56 PM  Result Value Ref Range   pH, Ven 7.41 7.250 - 7.430   pCO2, Ven 36 (L) 44.0 - 60.0 mmHg   Bicarbonate 22.8 20.0 - 28.0 mmol/L   Acid-base deficit 1.4 0.0 - 2.0 mmol/L   O2 Saturation 38.1 %   Patient temperature 37.0    Collection site VEIN    Sample type VENIPUNCTURE     Comment: Performed at [facility], [address], [place], [place] [address]  SARS Coronavirus 2 (CEPHEID - Performed in [place] hospital lab), Hosp Order     Status: None   Collection Time: 06/08/18  3:57 PM  Result Value Ref Range   SARS Coronavirus 2 NEGATIVE NEGATIVE    Comment: (NOTE) If result is NEGATIVE SARS-CoV-2 target nucleic acids are NOT DETECTED. The SARS-CoV-2 RNA is generally detectable in upper and lower  respiratory specimens during the acute phase of infection. The lowest  concentration of SARS-CoV-2 viral copies this assay can detect is 250  copies / mL. A negative result does not preclude SARS-CoV-2 infection  and should not be used as the sole  basis for treatment or other  patient management decisions.  A negative result may occur with  improper specimen collection / handling, submission of specimen other  than nasopharyngeal swab, presence of viral mutation(s) within the  areas targeted by this assay, and  inadequate number of viral copies  (<250 copies / mL). A negative result must be combined with clinical  observations, patient history, and epidemiological information. If result is POSITIVE SARS-CoV-2 target nucleic acids are DETECTED. The SARS-CoV-2 RNA is generally detectable in upper and lower  respiratory specimens dur ing the acute phase of infection.  Positive  results are indicative of active infection with SARS-CoV-2.  Clinical  correlation with patient history and other diagnostic information is  necessary to determine patient infection status.  Positive results do  not rule out bacterial infection or co-infection with other viruses. If result is PRESUMPTIVE POSTIVE SARS-CoV-2 nucleic acids MAY BE PRESENT.   A presumptive positive result was obtained on the submitted specimen  and confirmed on repeat testing.  While 2019 novel coronavirus  (SARS-CoV-2) nucleic acids may be present in the submitted sample  additional confirmatory testing may be necessary for epidemiological  and / or clinical management purposes  to differentiate between  SARS-CoV-2 and other Sarbecovirus currently known to infect humans.  If clinically indicated additional testing with an alternate test  methodology [phone]) is advised. The SARS-CoV-2 RNA is generally  detectable in upper and lower respiratory sp ecimens during the acute  phase of infection. The expected result is Negative. Fact Sheet for Patients:  StrictlyIdeas.no Fact Sheet for Healthcare Providers: BankingDealers.co.za This test is not yet approved or cleared by the [place] FDA and has been authorized for detection  and/or diagnosis of SARS-CoV-2 by FDA under an Emergency Use Authorization (EUA).  This EUA will remain in effect (meaning this test can be used) for the duration of the COVID-19 declaration under Section 564(b)(1) of the Act, 21 U.S.C. section 360bbb-3(b)(1), unless the authorization is terminated or revoked sooner. Performed at [facility], [address] [place], [place] [address]   Lactic acid, plasma     Status: Abnormal   Collection Time: 06/08/18  5:31 PM  Result Value Ref Range   Lactic Acid, Venous 3.7 (HH) 0.5 - 1.9 mmol/L    Comment: CRITICAL RESULT CALLED TO, READ BACK BY AND VERIFIED WITH Neosha Switalski @1803  06/08/18 MJU Performed at [facility], [address] [place], [place] [address]   Ethanol     Status: None   Collection Time: 06/08/18  5:31 PM  Result Value Ref Range   Alcohol, Ethyl (B) <10 <10 mg/dL    Comment: (NOTE) Lowest detectable limit for serum alcohol is 10 mg/dL. For medical purposes only. Performed at [facility], [address] [place], [place] [address]   CBC with Differential/Platelet     Status: Abnormal   Collection Time: 06/08/18  5:31 PM  Result Value Ref Range   WBC 13.1 (H) 4.0 - 10.5 K/uL   RBC 5.70 4.22 - 5.81 MIL/uL   Hemoglobin 15.1 13.0 - 17.0 g/dL   HCT 48.1 39.0 - 52.0 %   MCV 84.4 80.0 - 100.0 fL   MCH 26.5 26.0 - 34.0 pg   MCHC 31.4 30.0 - 36.0 g/dL   RDW 14.1 11.5 - 15.5 %   Platelets 164 150 - 400 K/uL   nRBC 0.0 0.0 - 0.2 %   Neutrophils Relative % 86 %   Neutro Abs 11.2 (H) 1.7 - 7.7 K/uL   Lymphocytes Relative 9 %   Lymphs Abs 1.2 0.7 - 4.0 K/uL   Monocytes Relative 4 %   Monocytes Absolute 0.5 0.1 - 1.0 K/uL   Eosinophils Relative 0 %   Eosinophils Absolute 0.0 0.0 -  0.5 K/uL   Basophils Relative 0 %   Basophils Absolute 0.1 0.0 - 0.1 K/uL   Immature Granulocytes 1 %   Abs Immature Granulocytes 0.09 (H) 0.00 - 0.07 K/uL    Comment: Performed at [facility], [address] [place], [place] [address]  Comprehensive metabolic panel     Status: Abnormal   Collection Time: 06/08/18  5:31 PM  Result Value Ref Range   Sodium 137 135 - 145 mmol/L   Potassium 5.5 (H) 3.5 - 5.1 mmol/L   Chloride 105 98 - 111 mmol/L   CO2 19 (L) 22 - 32 mmol/L   Glucose, Bld 101 (H) 70 - 99 mg/dL   BUN 31 (H) 8 - 23 mg/dL   Creatinine, Ser 1.37 (H) 0.61 - 1.24 mg/dL   Calcium 9.3 8.9 - 10.3 mg/dL   Total Protein 7.7 6.5 - 8.1 g/dL   Albumin 4.1 3.5 - 5.0 g/dL   AST 27 15 - 41 U/L   ALT 23 0 - 44 U/L   Alkaline Phosphatase 90 38 - 126 U/L   Total Bilirubin 0.6 0.3 - 1.2 mg/dL   GFR calc non Af Amer 46 (L) >60 mL/min   GFR calc Af Amer 53 (L) >60 mL/min   Anion gap 13 5 - 15    Comment: Performed at [facility], [address] [place], [place] [address]  Troponin I -     Status: None   Collection Time: 06/08/18  5:31 PM  Result Value Ref Range   Troponin I <0.03 <0.03 ng/mL    Comment: Performed at [facility], [address] [place], [place] [address]   Ct Abdomen Pelvis Wo Contrast  Result Date: 06/08/2018 CLINICAL DATA:  Unexplained altered level of consciousness. Hypotension. EXAM: CT ABDOMEN AND PELVIS WITHOUT CONTRAST TECHNIQUE: Multidetector CT imaging of the abdomen and pelvis was performed following the standard protocol without IV contrast. COMPARISON:  CT scan dated 11/20/2014 FINDINGS: Lower chest: Lung bases are clear. Aortic atherosclerosis. Extensive coronary artery calcifications. Heart size is normal. Hepatobiliary: Multiple stable small hepatic cysts. Cholecystectomy. No biliary dilatation. Pancreas: Unremarkable. No pancreatic ductal dilatation or surrounding inflammatory changes. Spleen: Normal in size without focal abnormality. Adrenals/Urinary Tract: Stable left adrenal adenoma containing small calcifications. Normal right adrenal gland. Stable bilateral renal cysts including a small hyperdense cyst on the posterior aspect of  the lower pole of the right kidney. No hydronephrosis. The bladder is empty with a Foley catheter in place. Stomach/Bowel: Small hiatal hernia. The bowel is otherwise normal including the terminal ileum and appendix. Vascular/Lymphatic: Aortic atherosclerosis. No enlarged abdominal or pelvic lymph nodes. Reproductive: Massive chronic enlargement of the prostate gland. Foley catheter in place. The gland measures 9 x 8.4 x 6.3 cm. Other: No abdominal wall hernia or abnormality. No abdominopelvic ascites. Musculoskeletal: No acute abnormality. Diffuse degenerative disc disease throughout the lumbar spine. IMPRESSION: 1. No acute abnormalities of the abdomen or pelvis. 2. Massive chronic enlargement of the prostate gland. Electronically Signed   By: Lorriane Shire M.D.   On: 06/08/2018 17:06   Ct Head Wo Contrast  Result Date: 06/08/2018 CLINICAL DATA:  Found unresponsive. EXAM: CT HEAD WITHOUT CONTRAST TECHNIQUE: Contiguous axial images were obtained from the base of the skull through the vertex without intravenous contrast. COMPARISON:  None. FINDINGS: Brain: Age related cerebral atrophy, ventriculomegaly and periventricular white matter disease. No extra-axial fluid collections are identified. No CT findings for acute hemispheric infarction or intracranial hemorrhage. No mass  lesions. The brainstem and cerebellum are normal. Vascular: Scattered vascular calcifications but no definite aneurysm or hyperdense vessels. Some gas is noted in the cavernous sinus which is probably from an IV start. Skull: No skull fracture or bone lesions. Moderate hyperostosis frontalis interna noted. Sinuses/Orbits: The paranasal sinuses and mastoid air cells are clear. The middle ear cavities are clear. Other: No scalp lesions or hematoma. IMPRESSION: 1. Age related cerebral atrophy, ventriculomegaly and periventricular white matter disease. 2. No acute intracranial findings or mass lesions. Electronically Signed   By: Marijo Sanes  M.D.   On: 06/08/2018 17:57   Dg Chest Port 1 View  Result Date: 06/08/2018 CLINICAL DATA:  Altered mental status.  Vomiting. EXAM: PORTABLE CHEST 1 VIEW COMPARISON:  None. FINDINGS: The heart size and mediastinal contours are within normal limits. Both lungs are clear. Old healed fracture of the mid left clavicle. IMPRESSION: No acute abnormalities. Electronically Signed   By: Lorriane Shire M.D.   On: 06/08/2018 16:40    Pending Labs Unresulted Labs (From admission, onward)    Start     Ordered   06/08/18 1557  Urine culture  Once,   STAT     06/08/18 1556   06/08/18 1556  CBC with Differential  Once,   STAT     06/08/18 1556   06/08/18 1556  Culture, blood (routine x 2)  BLOOD CULTURE X 2,   STAT     06/08/18 1556          Vitals/Pain Today's Vitals   06/08/18 1730 06/08/18 1830 06/08/18 1837 06/08/18 1840  BP: 130/86   117/74  Pulse: (!) 56  73 77  Resp: 14  (!) 25 (!) 26  Temp:  (!) 97.1 F (36.2 C) (!) 97 F (36.1 C) (!) 97 F (36.1 C)  TempSrc:    Core  SpO2: 100%  100% 100%  Height:      PainSc:    0-No pain    Isolation Precautions No active isolations  Medications Medications  ondansetron (ZOFRAN) 4 MG/2ML injection (has no administration in time range)  ceFEPIme (MAXIPIME) 2 g in sodium chloride 0.9 % 100 mL IVPB (2 g Intravenous New Bag/Given 06/08/18 1837)  metroNIDAZOLE (FLAGYL) IVPB 500 mg (500 mg Intravenous New Bag/Given 06/08/18 1838)  vancomycin (VANCOCIN) IVPB 1000 mg/200 mL premix (1,000 mg Intravenous New Bag/Given 06/08/18 1841)  0.9 %  sodium chloride infusion ( Intravenous New Bag/Given 06/08/18 1616)  promethazine (PHENERGAN) injection 6.25 mg (6.25 mg Intravenous Given 06/08/18 1703)  sodium chloride 0.9 % bolus 250 mL (0 mLs Intravenous Stopped 06/08/18 1841)    Mobility walks with device High fall risk   Focused Assessments Neuro Assessment Handoff:  Swallow screen pass? Yes  Cardiac Rhythm: Atrial fibrillation       Neuro Assessment:  Exceptions to WDL Neuro Checks:      Last Documented NIHSS Modified Score:   Has TPA been given? No If patient is a Neuro Trauma and patient is going to OR before floor call report to [place] nurse: [phone] or [phone]     R Recommendations: See Admitting Provider Note  Report given to:   Additional Notes:

## 2018-06-08 NOTE — Progress Notes (Signed)
   06/08/18 1630  Clinical Encounter Type  Visited With Patient;Family  Visit Type Follow-up  Referral From Chaplain  Ch followed up with the pt's daughter [name] and Son-in-law by ch Pete's referral. 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 Ch mainly focused on attending to the family as the pt was asleep. Daughter and Son-in-law appreciated the care and attention from the ch. Family are maintaining calmness.

## 2018-06-08 NOTE — H&P (Signed)
[facility] [place] NAME: Jacob Avila    MR#:  [id number]  DATE OF BIRTH:  1927/03/09  DATE OF ADMISSION:  06/08/2018  PRIMARY CARE PHYSICIAN: Albina Billet, MD   REQUESTING/REFERRING PHYSICIAN: Dr. Burlene Arnt  CHIEF COMPLAINT:   Chief Complaint  Patient presents with  . unresponsive    HISTORY OF PRESENT ILLNESS:  Jacob Avila  is a 85 y.o. male with a known history of cardiomyopathy with ejection fraction 25%, hypertension, paroxysmal atrial fibrillation, hyperlipidemia presents to the emergency room after his relatives found him passed out sweating profusely at home.  Initially patient was extremely lethargic but presently able to contribute to history.  Patient has felt poorly for 2 to 3 days, dizzy.  Today he was mowing his lawn after which he felt extremely weak and dizzy went inside and passed out.  With EMS arrival his blood pressure systolic was in the 36U.  Responded quickly in the emergency room to treatment with IV fluids and plugged up.  Initially mildly hypothermic but now normal temperature.  Lactic acid elevated 3.7.  Normal WBC and no source of infection.  No chest pain or shortness of breath or abdominal pain.  No nausea, vomiting or rash or fever.  No recent change in medications.  PAST MEDICAL HISTORY:   Past Medical History:  Diagnosis Date  . 1st degree AV block 05/13/2012  . Arteriosclerosis of coronary artery 11/12/2014  . Atrial fibrillation ([place]) 11/30/2016  . BP (high blood pressure) 05/12/2012  . Bradycardia 05/12/2012  . Dizziness 05/12/2012  . HLD (hyperlipidemia)     PAST SURGICAL HISTORY:   Past Surgical History:  Procedure Laterality Date  . CHOLECYSTECTOMY  2008  . SHOULDER SURGERY  2005    SOCIAL HISTORY:   Social History   Tobacco Use  . Smoking status: Former Smoker    Types: Cigarettes    Last attempt to quit: 01/05/1978    Years since quitting: 40.4  . Smokeless tobacco: Never Used   Substance Use Topics  . Alcohol use: No    Alcohol/week: 0.0 standard drinks    FAMILY HISTORY:   Per chart review no carcinomas in the family. DRUG ALLERGIES:  No Known Allergies  REVIEW OF SYSTEMS:   Review of Systems  Constitutional: Positive for malaise/fatigue. Negative for chills and fever.  HENT: Negative for sore throat.   Eyes: Negative for blurred vision, double vision and pain.  Respiratory: Negative for cough, hemoptysis, shortness of breath and wheezing.   Cardiovascular: Negative for chest pain, palpitations, orthopnea and leg swelling.  Gastrointestinal: Negative for abdominal pain, constipation, diarrhea, heartburn, nausea and vomiting.  Genitourinary: Negative for dysuria and hematuria.  Musculoskeletal: Negative for back pain and joint pain.  Skin: Negative for rash.  Neurological: Positive for dizziness. Negative for sensory change, speech change, focal weakness and headaches.  Endo/Heme/Allergies: Does not bruise/bleed easily.  Psychiatric/Behavioral: Negative for depression. The patient is not nervous/anxious.     MEDICATIONS AT HOME:   Prior to Admission medications   Medication Sig Start Date End Date Taking? Authorizing Provider  aspirin EC 81 MG tablet Take 81 mg by mouth.    [provider]  atorvastatin (LIPITOR) 80 MG tablet Take 80 mg by mouth. 11/07/16 12/07/16  [provider]  clopidogrel (PLAVIX) 75 MG tablet Take 75 mg by mouth. 11/08/16   [provider]  furosemide (LASIX) 20 MG tablet Take 40 mg by mouth. 10/09/16 01/12/17  [provider]  lisinopril (PRINIVIL,ZESTRIL) 2.5 MG tablet Take 2.5 mg by mouth. 11/08/16   [provider]  metoprolol succinate (TOPROL-XL) 50 MG 24 hr tablet Take 50 mg by mouth. 11/13/16 01/12/17  [provider]  omeprazole (PRILOSEC) 20 MG capsule Take 20 mg by mouth. 11/13/16 12/13/16  [provider]  simvastatin (ZOCOR) 40 MG tablet Take 40 mg by mouth.  01/16/10   [provider]     VITAL SIGNS:  Blood pressure 133/70, pulse 65, temperature (!) 97 F (36.1 C), temperature source Core, resp. rate (!) 26, height 6' (1.829 m), SpO2 100 %.  PHYSICAL EXAMINATION:  Physical Exam  GENERAL:  86 y.o.-year-old patient lying in the bed with no acute distress.  EYES: Pupils equal, round, reactive to light and accommodation. No scleral icterus. Extraocular muscles intact.  HEENT: Head atraumatic, normocephalic. Oropharynx and nasopharynx clear. No oropharyngeal erythema, dry oral mucosa  NECK:  Supple, no jugular venous distention. No thyroid enlargement, no tenderness.  LUNGS: Normal breath sounds bilaterally, no wheezing, rales, rhonchi. No use of accessory muscles of respiration.  CARDIOVASCULAR: S1, S2 normal. No murmurs, rubs, or gallops.  ABDOMEN: Soft, nontender, nondistended. Bowel sounds present. No organomegaly or mass.  EXTREMITIES: No pedal edema, cyanosis, or clubbing. + 2 pedal & radial pulses b/l.   NEUROLOGIC: Cranial nerves II through XII are intact. No focal Motor or sensory deficits appreciated b/l PSYCHIATRIC: The patient is alert and oriented x 3. Good affect.  SKIN: No obvious rash, lesion, or ulcer.   LABORATORY PANEL:   CBC Recent Labs  Lab 06/08/18 1731  WBC 13.1*  HGB 15.1  HCT 48.1  PLT 164   ------------------------------------------------------------------------------------------------------------------  Chemistries  Recent Labs  Lab 06/08/18 1554 06/08/18 1731  NA  --  137  K  --  5.5*  CL  --  105  CO2  --  19*  GLUCOSE  --  101*  BUN  --  31*  CREATININE  --  1.37*  CALCIUM  --  9.3  MG 2.2  --   AST  --  27  ALT  --  23  ALKPHOS  --  90  BILITOT  --  0.6   ------------------------------------------------------------------------------------------------------------------  Cardiac Enzymes Recent Labs  Lab 06/08/18 1731  TROPONINI <0.03    ------------------------------------------------------------------------------------------------------------------  RADIOLOGY:  Ct Abdomen Pelvis Wo Contrast  Result Date: 06/08/2018 CLINICAL DATA:  Unexplained altered level of consciousness. Hypotension. EXAM: CT ABDOMEN AND PELVIS WITHOUT CONTRAST TECHNIQUE: Multidetector CT imaging of the abdomen and pelvis was performed following the standard protocol without IV contrast. COMPARISON:  CT scan dated 11/20/2014 FINDINGS: Lower chest: Lung bases are clear. Aortic atherosclerosis. Extensive coronary artery calcifications. Heart size is normal. Hepatobiliary: Multiple stable small hepatic cysts. Cholecystectomy. No biliary dilatation. Pancreas: Unremarkable. No pancreatic ductal dilatation or surrounding inflammatory changes. Spleen: Normal in size without focal abnormality. Adrenals/Urinary Tract: Stable left adrenal adenoma containing small calcifications. Normal right adrenal gland. Stable bilateral renal cysts including a small hyperdense cyst on the posterior aspect of the lower pole of the right kidney. No hydronephrosis. The bladder is empty with a Foley catheter in place. Stomach/Bowel: Small hiatal hernia. The bowel is otherwise normal including the terminal ileum and appendix. Vascular/Lymphatic: Aortic atherosclerosis. No enlarged abdominal or pelvic lymph nodes. Reproductive: Massive chronic enlargement of the prostate gland. Foley catheter in place. The gland measures 9 x 8.4 x 6.3 cm. Other: No abdominal wall hernia or abnormality. No abdominopelvic ascites. Musculoskeletal: No acute abnormality. Diffuse degenerative disc disease  throughout the lumbar spine. IMPRESSION: 1. No acute abnormalities of the abdomen or pelvis. 2. Massive chronic enlargement of the prostate gland. Electronically Signed   By: Lorriane Shire M.D.   On: 06/08/2018 17:06   Ct Head Wo Contrast  Result Date: 06/08/2018 CLINICAL DATA:  Found unresponsive. EXAM: CT HEAD  WITHOUT CONTRAST TECHNIQUE: Contiguous axial images were obtained from the base of the skull through the vertex without intravenous contrast. COMPARISON:  None. FINDINGS: Brain: Age related cerebral atrophy, ventriculomegaly and periventricular white matter disease. No extra-axial fluid collections are identified. No CT findings for acute hemispheric infarction or intracranial hemorrhage. No mass lesions. The brainstem and cerebellum are normal. Vascular: Scattered vascular calcifications but no definite aneurysm or hyperdense vessels. Some gas is noted in the cavernous sinus which is probably from an IV start. Skull: No skull fracture or bone lesions. Moderate hyperostosis frontalis interna noted. Sinuses/Orbits: The paranasal sinuses and mastoid air cells are clear. The middle ear cavities are clear. Other: No scalp lesions or hematoma. IMPRESSION: 1. Age related cerebral atrophy, ventriculomegaly and periventricular white matter disease. 2. No acute intracranial findings or mass lesions. Electronically Signed   By: Marijo Sanes M.D.   On: 06/08/2018 17:57   Dg Chest Port 1 View  Result Date: 06/08/2018 CLINICAL DATA:  Altered mental status.  Vomiting. EXAM: PORTABLE CHEST 1 VIEW COMPARISON:  None. FINDINGS: The heart size and mediastinal contours are within normal limits. Both lungs are clear. Old healed fracture of the mid left clavicle. IMPRESSION: No acute abnormalities. Electronically Signed   By: Lorriane Shire M.D.   On: 06/08/2018 16:40     IMPRESSION AND PLAN:   *Heat exhaustion.  Patient will be admitted to the hospital on IV fluids.  Presently temperature normal.  Does have associated dehydration, acute kidney injury, hyperkalemia.  *Syncope likely due to orthostatic hypotension from dehydration.  Will be admitted to medical floor with telemetry monitoring.  Check echocardiogram.  *Acute kidney injury.  Start IV fluids.  Monitor input and output.  Repeat labs in the  morning.  *Hyperkalemia secondary to acute kidney injury should improve with fluids.  Will repeat BMP in 2 hours along with lactic acid.  *Lactic acidosis likely secondary to poor perfusion.  No signs of infection.  No need for antibiotics.  Repeat lactic acid in 2 hours.  IV fluids.  *Chronic systolic CHF with ejection fraction 25%.  No signs of fluid overload.  *Paroxysmal atrial fibrillation.  Continue home medications along with Xarelto.  All the records are reviewed and case discussed with ED provider. Management plans discussed with the patient, family and they are in agreement.  CODE STATUS: FULL CODE  TOTAL TIME TAKING CARE OF THIS PATIENT: 40 minutes.   Leia Alf Zykee Avakian M.D on 06/08/2018 at 7:22 PM  Between 7am to 6pm - Pager - [phone]  After 6pm go to [email or web address] - password [email or web address] [facility]  Office  [phone]  CC: Primary care physician; Albina Billet, MD  Note: This dictation was prepared with Dragon dictation along with smaller phrase technology. Any transcriptional errors that result from this process are unintentional.

## 2018-06-08 NOTE — ED Notes (Signed)
4mg  zofran in at this time by Jenetta Downer, Therapist, sports.

## 2018-06-08 NOTE — ED Triage Notes (Signed)
Pt presents from home via [facility]ms. upon ems arrival pt was unresponsive laying in floor of living room. Family thinks pt was outside mowing earlier. Initially pt had no radial pulses or bp when ems arrived. 1100 of fluid, 4mg  of zofran given in route. BP of 130/79 after fluid. Junctional rhythm on monitor for ems. HR 40 initially, came up to 60. that came up to 157 sugar.  Hx of a-fib and one stent placement. 18G placed in each AC by ems. MD McShane at bedside.

## 2018-06-08 NOTE — Progress Notes (Signed)
Advance care planning  Purpose of Encounter Acute kidney injury, dehydration  Parties in Attendance Patient  Patients Decisional capacity Alert and oriented.  Able to make medical decisions.  His healthcare power of attorney is Jacob Avila.  He is separated from his wife.  No ACP documents in place  Discussed in detail regarding dehydration, acute kidney injury.  Treatment plan , prognosis discussed.  All questions answered  CODE STATUS discussed and patient requests aggressive care with CPR and intubation if needed  Orders entered and CODE STATUS changed  Time spent - 17 minutes

## 2018-06-08 NOTE — ED Notes (Signed)
Date and time results received: 06/08/18 5:20 PM  (use smartphrase ".now" to insert current time)  Test: Lactic Critical Value: 2.4  Name of Provider Notified: Dr. Burlene Arnt  Orders Received? Or Actions Taken?: Orders Received - See Orders for details

## 2018-06-08 NOTE — ED Notes (Signed)
Blood sugar 92

## 2018-06-08 NOTE — ED Notes (Signed)
Pt vomiting at this time. Verbal order for 4mg  zofran at this time per MD McShane.

## 2018-06-08 NOTE — ED Notes (Signed)
Verbal order from admitting doctor to take foley out at this time.

## 2018-06-09 ENCOUNTER — Inpatient Hospital Stay (HOSPITAL_COMMUNITY)
Admit: 2018-06-09 | Discharge: 2018-06-09 | Disposition: A | Discharge status: Home / Self Care | Payer: Medicare Other | Attending: Internal Medicine | Admitting: Internal Medicine

## 2018-06-09 DIAGNOSIS — R55 Syncope and collapse: Secondary | ICD-10-CM

## 2018-06-09 LAB — CBC
HCT: 44.3 % (ref 39.0–52.0)
Hemoglobin: 14.2 g/dL (ref 13.0–17.0)
MCH: 26.6 pg (ref 26.0–34.0)
MCHC: 32.1 g/dL (ref 30.0–36.0)
MCV: 83.1 fL (ref 80.0–100.0)
Platelets: 167 10*3/uL (ref 150–400)
RBC: 5.33 MIL/uL (ref 4.22–5.81)
RDW: 14.3 % (ref 11.5–15.5)
WBC: 8.3 10*3/uL (ref 4.0–10.5)
nRBC: 0 % (ref 0.0–0.2)

## 2018-06-09 LAB — BASIC METABOLIC PANEL
Anion gap: 8 (ref 5–15)
BUN: 29 mg/dL — ABNORMAL HIGH (ref 8–23)
CO2: 21 mmol/L — ABNORMAL LOW (ref 22–32)
Calcium: 8.5 mg/dL — ABNORMAL LOW (ref 8.9–10.3)
Chloride: 106 mmol/L (ref 98–111)
Creatinine, Ser: 1.17 mg/dL (ref 0.61–1.24)
GFR calc Af Amer: >60 mL/min (ref >60)
GFR calc non Af Amer: 55 mL/min — ABNORMAL LOW (ref >60)
Glucose, Bld: 104 mg/dL — ABNORMAL HIGH (ref 70–99)
Potassium: 4.7 mmol/L (ref 3.5–5.1)
Sodium: 135 mmol/L (ref 135–145)

## 2018-06-09 LAB — URINE CULTURE: Culture: NO GROWTH

## 2018-06-09 LAB — ECHOCARDIOGRAM COMPLETE
Height: 72 in
Weight: 3005.31 oz

## 2018-06-09 LAB — LACTIC ACID, PLASMA: Lactic Acid, Venous: 1.8 mmol/L (ref 0.5–1.9)

## 2018-06-09 MED ORDER — FINASTERIDE 5 MG PO TABS
5.0000 mg | ORAL_TABLET | Freq: Every day | ORAL | Status: DC
  Administered 2018-06-09 – 2018-06-10 (×2): 5 mg via ORAL
  Filled 2018-06-09 (×2): qty 1

## 2018-06-09 MED ORDER — TAMSULOSIN HCL 0.4 MG PO CAPS
0.4000 mg | ORAL_CAPSULE | Freq: Every day | ORAL | Status: DC
  Administered 2018-06-09 – 2018-06-10 (×2): 0.4 mg via ORAL
  Filled 2018-06-09 (×2): qty 1

## 2018-06-09 NOTE — Progress Notes (Signed)
[facility] [place] NAME: Jacob Avila    MR#:  [id number]  DATE OF BIRTH:  [birth year]  SUBJECTIVE:   Presented to the hospital due to weakness and dizziness and presyncope and noted to have heat exhaustion.  Also noted to be in acute kidney injury.  Patient now having urinary retention and had a CT scan which showed enlarged prostate.  Patient also having some mild hematuria with hemoglobin stable.  REVIEW OF SYSTEMS:    Review of Systems  Constitutional: Negative for chills and fever.  HENT: Negative for congestion and tinnitus.   Eyes: Negative for blurred vision and double vision.  Respiratory: Negative for cough, shortness of breath and wheezing.   Cardiovascular: Negative for chest pain, orthopnea and PND.  Gastrointestinal: Negative for abdominal pain, diarrhea, nausea and vomiting.  Genitourinary: Positive for hematuria. Negative for dysuria.       Urinary Retention  Neurological: Negative for dizziness, sensory change and focal weakness.  All other systems reviewed and are negative.   Nutrition: Heart Healthy Tolerating Diet: Yes Tolerating PT: Await Eval.   DRUG ALLERGIES:  No Known Allergies  VITALS:  Blood pressure 130/89, pulse 71, temperature 97.7 F (36.5 C), temperature source Oral, resp. rate 19, height 6' (1.829 m), weight 85.2 kg, SpO2 96 %.  PHYSICAL EXAMINATION:   Physical Exam  GENERAL:  85 y.o.-year-old patient lying in bed in no acute distress.  EYES: Pupils equal, round, reactive to light and accommodation. No scleral icterus. Extraocular muscles intact.  HEENT: Head atraumatic, normocephalic. Oropharynx and nasopharynx clear.  NECK:  Supple, no jugular venous distention. No thyroid enlargement, no tenderness.  LUNGS: Normal breath sounds bilaterally, no wheezing, rales, rhonchi. No use of accessory muscles of respiration.  CARDIOVASCULAR: S1, S2 normal. No murmurs, rubs, or gallops.  ABDOMEN:  Soft, nontender, nondistended. Bowel sounds present. No organomegaly or mass.  EXTREMITIES: No cyanosis, clubbing or edema b/l.    NEUROLOGIC: Cranial nerves II through XII are intact. No focal Motor or sensory deficits b/l.   PSYCHIATRIC: The patient is alert and oriented x 3.  SKIN: No obvious rash, lesion, or ulcer.    LABORATORY PANEL:   CBC Recent Labs  Lab 06/09/18 0546  WBC 8.3  HGB 14.2  HCT 44.3  PLT 167   ------------------------------------------------------------------------------------------------------------------  Chemistries  Recent Labs  Lab 06/08/18 1554 06/08/18 1731  06/09/18 0546  NA  --  137   < > 135  K  --  5.5*   < > 4.7  CL  --  105   < > 106  CO2  --  19*   < > 21*  GLUCOSE  --  101*   < > 104*  BUN  --  31*   < > 29*  CREATININE  --  1.37*   < > 1.17  CALCIUM  --  9.3   < > 8.5*  MG 2.2  --   --   --   AST  --  27  --   --   ALT  --  23  --   --   ALKPHOS  --  90  --   --   BILITOT  --  0.6  --   --    < > = values in this interval not displayed.   ------------------------------------------------------------------------------------------------------------------  Cardiac Enzymes Recent Labs  Lab 06/08/18 1731  TROPONINI <0.03   ------------------------------------------------------------------------------------------------------------------  RADIOLOGY:  Ct Abdomen Pelvis Wo  Contrast  Result Date: 06/08/2018 CLINICAL DATA:  Unexplained altered level of consciousness. Hypotension. EXAM: CT ABDOMEN AND PELVIS WITHOUT CONTRAST TECHNIQUE: Multidetector CT imaging of the abdomen and pelvis was performed following the standard protocol without IV contrast. COMPARISON:  CT scan dated 11/20/2014 FINDINGS: Lower chest: Lung bases are clear. Aortic atherosclerosis. Extensive coronary artery calcifications. Heart size is normal. Hepatobiliary: Multiple stable small hepatic cysts. Cholecystectomy. No biliary dilatation. Pancreas: Unremarkable. No  pancreatic ductal dilatation or surrounding inflammatory changes. Spleen: Normal in size without focal abnormality. Adrenals/Urinary Tract: Stable left adrenal adenoma containing small calcifications. Normal right adrenal gland. Stable bilateral renal cysts including a small hyperdense cyst on the posterior aspect of the lower pole of the right kidney. No hydronephrosis. The bladder is empty with a Foley catheter in place. Stomach/Bowel: Small hiatal hernia. The bowel is otherwise normal including the terminal ileum and appendix. Vascular/Lymphatic: Aortic atherosclerosis. No enlarged abdominal or pelvic lymph nodes. Reproductive: Massive chronic enlargement of the prostate gland. Foley catheter in place. The gland measures 9 x 8.4 x 6.3 cm. Other: No abdominal wall hernia or abnormality. No abdominopelvic ascites. Musculoskeletal: No acute abnormality. Diffuse degenerative disc disease throughout the lumbar spine. IMPRESSION: 1. No acute abnormalities of the abdomen or pelvis. 2. Massive chronic enlargement of the prostate gland. Electronically Signed   By: Lorriane Shire M.D.   On: 06/08/2018 17:06   Ct Head Wo Contrast  Result Date: 06/08/2018 CLINICAL DATA:  Found unresponsive. EXAM: CT HEAD WITHOUT CONTRAST TECHNIQUE: Contiguous axial images were obtained from the base of the skull through the vertex without intravenous contrast. COMPARISON:  None. FINDINGS: Brain: Age related cerebral atrophy, ventriculomegaly and periventricular white matter disease. No extra-axial fluid collections are identified. No CT findings for acute hemispheric infarction or intracranial hemorrhage. No mass lesions. The brainstem and cerebellum are normal. Vascular: Scattered vascular calcifications but no definite aneurysm or hyperdense vessels. Some gas is noted in the cavernous sinus which is probably from an IV start. Skull: No skull fracture or bone lesions. Moderate hyperostosis frontalis interna noted. Sinuses/Orbits: The  paranasal sinuses and mastoid air cells are clear. The middle ear cavities are clear. Other: No scalp lesions or hematoma. IMPRESSION: 1. Age related cerebral atrophy, ventriculomegaly and periventricular white matter disease. 2. No acute intracranial findings or mass lesions. Electronically Signed   By: Marijo Sanes M.D.   On: 06/08/2018 17:57   Dg Chest Port 1 View  Result Date: 06/08/2018 CLINICAL DATA:  Altered mental status.  Vomiting. EXAM: PORTABLE CHEST 1 VIEW COMPARISON:  None. FINDINGS: The heart size and mediastinal contours are within normal limits. Both lungs are clear. Old healed fracture of the mid left clavicle. IMPRESSION: No acute abnormalities. Electronically Signed   By: Lorriane Shire M.D.   On: 06/08/2018 16:40     ASSESSMENT AND PLAN:   85 year old male with past medical history of atrial fibrillation, hypertension, hyperlipidemia who presented to the hospital due to weakness, dizziness and noted to be in acute kidney injury and also noted to have heat exhaustion.  1.  Syncope/altered mental status-secondary to dehydration and heat exhaustion. - Much improved and patient's mental status is at baseline.  No further episodes of syncope.  No alarms on telemetry. -Continue IV fluid hydration.  CT head was negative for acute pathology on admission.  No other acute infectious/metabolic source.  2.  Heat exhaustion-secondary to patient being outside for prolonged period time yesterday. -Improved with IV fluid hydration.  Clinically patient is asymptomatic now.  3.  Acute kidney injury-secondary to dehydration and heat exhaustion//urinary retention. - Improved with IV fluid hydration.  Creatinine is currently close to baseline. -Patient is also status post in and out cath with 900 cc of fluid removed.  4.  Urinary retention-secondary to BPH.  Patient CT scan admission showing a large prostate. -Patient has no previous history of hematuria or urinary retention.  Patient had a  Foley catheter placed in the ER but adamantly wanted it removed.  This morning he was having significant urinary tension and in and out cath done.  900 cc of urine drained. -Started on Flomax and finasteride.  May refer to urology as an outpatient. - if needed will replace Foley cath.   5.  History of atrial fibrillation-rate controlled.  Continue Toprol, continue Xarelto.      All the records are reviewed and case discussed with Care Management/Social Worker. Management plans discussed with the patient, family and they are in agreement.  CODE STATUS: Full code  DVT Prophylaxis: Xarelto  TOTAL TIME TAKING CARE OF THIS PATIENT: 30 minutes.   POSSIBLE D/C IN 1-2 DAYS, DEPENDING ON CLINICAL CONDITION.   Henreitta Leber M.D on 06/09/2018 at 3:13 PM  Between 7am to 6pm - Pager - [phone]  After 6pm go to [email or web address] - Proofreader  [employer] [facility]  Office  [phone]  CC: Primary care physician; Albina Billet, MD

## 2018-06-09 NOTE — Progress Notes (Addendum)
Notified MD Mansy that patient had 2.29 second pause. MD order to place pads at bedside.   Update: Patient has not voided during this shift, bladder scan performed--430 cc in bladder. Patient attempted to urinate but was unable; dribbles of red urine noted in urinal and on bed pad. Patient reports that it hurts while attempting to urinate. MD Mansy notified. Verbal order to irrigate bladder. This RN will attempt to place foley for irrigation.   Update: Patient refusing to have catheter placed for irrigation. Dayshift RN aware. She will continue to monitor.   Iran Sizer M

## 2018-06-09 NOTE — Progress Notes (Signed)
Patient lactic 2.1. Abnormal lab reported to Whitney, NP. No new orders at this time. Will continue to monitor.   Iran Sizer M

## 2018-06-09 NOTE — Progress Notes (Signed)
Nutrition Brief Note  Patient identified on the Malnutrition Screening Tool (MST) Report  85 y.o. male with a known history of cardiomyopathy with ejection fraction 25%, hypertension, paroxysmal atrial fibrillation, hyperlipidemia presents to the emergency room with heat exhaustion.   Wt Readings from Last 15 Encounters:  06/09/18 85.2 kg  03/24/17 100.2 kg  02/03/17 95.7 kg  11/30/16 100.2 kg  02/22/15 111.9 kg  11/21/14 111.5 kg  11/13/14 112 kg    Body mass index is 25.47 kg/m. Patient meets criteria for overweight based on current BMI.   Current diet order is HH, patient is consuming approximately 90% of meals at this time. Labs and medications reviewed.   No nutrition interventions warranted at this time. If nutrition issues arise, please consult RD.   Koleen Distance MS, RD, LDN Pager #- [phone] Office#- [phone] After Hours Pager: [phone]

## 2018-06-09 NOTE — Progress Notes (Signed)
*  PRELIMINARY RESULTS* Echocardiogram 2D Echocardiogram has been performed.  Jacob Avila Jacob Avila Jacob Avila 06/09/2018, 11:48 AM

## 2018-06-09 NOTE — Plan of Care (Signed)
  Problem: Clinical Measurements: Goal: Respiratory complications will improve Outcome: Progressing   Problem: Activity: Goal: Risk for activity intolerance will decrease Outcome: Progressing   Problem: Elimination: Goal: Will not experience complications related to bowel motility Outcome: Progressing   Problem: Safety: Goal: Ability to remain free from injury will improve Outcome: Progressing   

## 2018-06-10 LAB — BASIC METABOLIC PANEL
Anion gap: 5 (ref 5–15)
BUN: 28 mg/dL — ABNORMAL HIGH (ref 8–23)
CO2: 27 mmol/L (ref 22–32)
Calcium: 9 mg/dL (ref 8.9–10.3)
Chloride: 106 mmol/L (ref 98–111)
Creatinine, Ser: 1.06 mg/dL (ref 0.61–1.24)
GFR calc Af Amer: >60 mL/min (ref >60)
GFR calc non Af Amer: >60 mL/min (ref >60)
Glucose, Bld: 95 mg/dL (ref 70–99)
Potassium: 4.7 mmol/L (ref 3.5–5.1)
Sodium: 138 mmol/L (ref 135–145)

## 2018-06-10 MED ORDER — TAMSULOSIN HCL 0.4 MG PO CAPS: 0.4000 mg | ORAL_CAPSULE | Freq: Every day | ORAL | 1 refills | Status: AC

## 2018-06-10 MED ORDER — FINASTERIDE 5 MG PO TABS: 5.0000 mg | ORAL_TABLET | Freq: Every day | ORAL | 1 refills | Status: AC

## 2018-06-10 NOTE — Plan of Care (Signed)

## 2018-06-10 NOTE — Discharge Summary (Signed)
[facility] [place] NAME: Jacob Avila    MR#:  [id number]  DATE OF BIRTH:  1927/07/27  DATE OF ADMISSION:  06/08/2018 ADMITTING PHYSICIAN: Hillary Bow, MD  DATE OF DISCHARGE: 06/10/2018  1:33 PM  PRIMARY CARE PHYSICIAN: Albina Billet, MD    ADMISSION DIAGNOSIS:  Dehydration [E86.0] Altered mental status [R41.82]  DISCHARGE DIAGNOSIS:  Active Problems:   Syncope   SECONDARY DIAGNOSIS:   Past Medical History:  Diagnosis Date  . 1st degree AV block 05/13/2012  . Arteriosclerosis of coronary artery 11/12/2014  . Atrial fibrillation ([place]) 11/30/2016  . BP (high blood pressure) 05/12/2012  . Bradycardia 05/12/2012  . Dizziness 05/12/2012  . HLD (hyperlipidemia)     HOSPITAL COURSE:   85 year old male with past medical history of atrial fibrillation, hypertension, hyperlipidemia who presented to the hospital due to weakness, dizziness and noted to be in acute kidney injury and also noted to have heat exhaustion.  1.  Syncope/altered mental status- secondary to dehydration and heat exhaustion and improved with IV fluid hydration.  - Much improved and patient's mental status is at baseline.  No further episodes of syncope.  No alarms on telemetry. CT head was negative for acute pathology on admission.  No other acute infectious/metabolic source.  2.  Heat exhaustion-secondary to patient being outside for prolonged period time yesterday. -Improved and resolved with IV fluid hydration.  Clinically asymptomatic now  3.  Acute kidney injury-secondary to dehydration and heat exhaustion//urinary retention. -Patient given IV fluids, also underwent in and out cath and creatinine has improved is currently at baseline.  Patient was started on some Flomax and finasteride.  Outpatient urology referral was made.  4.  Urinary retention-secondary to BPH.  Patient CT scan admission showing a large prostate. -Patient required in and out cath while in  the hospital and was started on Flomax and finasteride. -Patient is now voiding well and will continue those medications and outpatient urology referral was made.  5.  History of atrial fibrillation-rate controlled.  Continue Toprol, continue Xarelto.  DISCHARGE CONDITIONS:   Stable.   CONSULTS OBTAINED:    DRUG ALLERGIES:   Allergies  Allergen Reactions  . Codeine Other (See Comments)    DISCHARGE MEDICATIONS:   Allergies as of 06/10/2018      Reactions   Codeine Other (See Comments)      Medication List    STOP taking these medications   clopidogrel 75 MG tablet Commonly known as:  PLAVIX     TAKE these medications   atorvastatin 80 MG tablet Commonly known as:  LIPITOR Take 80 mg by mouth.   finasteride 5 MG tablet Commonly known as:  PROSCAR Take 1 tablet (5 mg total) by mouth daily. Start taking on:  June 11, 2018   fluticasone 50 MCG/ACT nasal spray Commonly known as:  FLONASE Place 2 sprays into both nostrils 2 (two) times a day.   furosemide 20 MG tablet Commonly known as:  LASIX Take 40 mg by mouth.   lisinopril 10 MG tablet Commonly known as:  ZESTRIL Take 10 mg by mouth daily.   metoprolol succinate 50 MG 24 hr tablet Commonly known as:  TOPROL-XL Take 50 mg by mouth.   nitroGLYCERIN 0.4 MG SL tablet Commonly known as:  NITROSTAT Place 0.4 mg under the tongue as needed.   omeprazole 20 MG capsule Commonly known as:  PRILOSEC Take 20 mg by mouth.   Rivaroxaban 15 MG Tabs tablet Commonly  known as:  XARELTO Take 15 mg by mouth daily.   spironolactone 25 MG tablet Commonly known as:  ALDACTONE Take 25 mg by mouth daily.   tamsulosin 0.4 MG Caps capsule Commonly known as:  FLOMAX Take 1 capsule (0.4 mg total) by mouth daily. Start taking on:  June 11, 2018         DISCHARGE INSTRUCTIONS:   DIET:  Cardiac diet  DISCHARGE CONDITION:  Stable  ACTIVITY:  Activity as tolerated  OXYGEN:  Home Oxygen: No.   Oxygen  Delivery: room air  DISCHARGE LOCATION:  home   If you experience worsening of your admission symptoms, develop shortness of breath, life threatening emergency, suicidal or homicidal thoughts you must seek medical attention immediately by calling 911 or calling your MD immediately  if symptoms less severe.  You Must read complete instructions/literature along with all the possible adverse reactions/side effects for all the Medicines you take and that have been prescribed to you. Take any new Medicines after you have completely understood and accpet all the possible adverse reactions/side effects.   Please note  You were cared for by a hospitalist during your hospital stay. If you have any questions about your discharge medications or the care you received while you were in the hospital after you are discharged, you can call the unit and asked to speak with the hospitalist on call if the hospitalist that took care of you is not available. Once you are discharged, your primary care physician will handle any further medical issues. Please note that NO REFILLS for any discharge medications will be authorized once you are discharged, as it is imperative that you return to your primary care physician (or establish a relationship with a primary care physician if you do not have one) for your aftercare needs so that they can reassess your need for medications and monitor your lab values.     Today   No further urinary retention.  Creatinine stable.  No hematuria.  Will discharge home today with outpatient urology referral.  VITAL SIGNS:  Blood pressure 129/86, pulse 61, temperature (!) 96.1 F (35.6 C), temperature source Axillary, resp. rate 18, height 6' (1.829 m), weight 86.7 kg, SpO2 98 %.  I/O:    Intake/Output Summary (Last 24 hours) at 06/10/2018 1624 Last data filed at 06/10/2018 1045 Gross per 24 hour  Intake 243 ml  Output 900 ml  Net -657 ml    PHYSICAL EXAMINATION:  GENERAL:  85  y.o.-year-old patient lying in the bed with no acute distress.  EYES: Pupils equal, round, reactive to light and accommodation. No scleral icterus. Extraocular muscles intact.  HEENT: Head atraumatic, normocephalic. Oropharynx and nasopharynx clear.  NECK:  Supple, no jugular venous distention. No thyroid enlargement, no tenderness.  LUNGS: Normal breath sounds bilaterally, no wheezing, rales,rhonchi. No use of accessory muscles of respiration.  CARDIOVASCULAR: S1, S2 normal. No murmurs, rubs, or gallops.  ABDOMEN: Soft, non-tender, non-distended. Bowel sounds present. No organomegaly or mass.  EXTREMITIES: No pedal edema, cyanosis, or clubbing.  NEUROLOGIC: Cranial nerves II through XII are intact. No focal motor or sensory defecits b/l.  PSYCHIATRIC: The patient is alert and oriented x 3. SKIN: No obvious rash, lesion, or ulcer.   DATA REVIEW:   CBC Recent Labs  Lab 06/09/18 0546  WBC 8.3  HGB 14.2  HCT 44.3  PLT 167    Chemistries  Recent Labs  Lab 06/08/18 1554 06/08/18 1731  06/10/18 0548  NA  --  137   < >  138  K  --  5.5*   < > 4.7  CL  --  105   < > 106  CO2  --  19*   < > 27  GLUCOSE  --  101*   < > 95  BUN  --  31*   < > 28*  CREATININE  --  1.37*   < > 1.06  CALCIUM  --  9.3   < > 9.0  MG 2.2  --   --   --   AST  --  27  --   --   ALT  --  23  --   --   ALKPHOS  --  90  --   --   BILITOT  --  0.6  --   --    < > = values in this interval not displayed.    Cardiac Enzymes Recent Labs  Lab 06/08/18 1731  TROPONINI <0.03    Microbiology Results  Results for orders placed or performed during the hospital encounter of 06/08/18  Urine culture     Status: None   Collection Time: 06/08/18  3:54 PM  Result Value Ref Range Status   Specimen Description   Final    URINE, RANDOM Performed at [facility], [address] [place], [place] [address]    Special Requests   Final    NONE Performed at [facility], [address]  [place], [place] [address]    Culture   Final    NO GROWTH Performed at [facility], [place] [address], [place], [place] [address]    Report Status 06/09/2018 FINAL  Final  Culture, blood (routine x 2)     Status: None (Preliminary result)   Collection Time: 06/08/18  3:56 PM  Result Value Ref Range Status   Specimen Description BLOOD BLOOD RIGHT HAND  Final   Special Requests   Final    BOTTLES DRAWN AEROBIC AND ANAEROBIC Blood Culture adequate volume   Culture   Final    NO GROWTH 2 DAYS Performed at [facility], [address], [place],  [address]    Report Status PENDING  Incomplete  SARS Coronavirus 2 (CEPHEID - Performed in [place] hospital lab), Hosp Order     Status: None   Collection Time: 06/08/18  3:57 PM  Result Value Ref Range Status   SARS Coronavirus 2 NEGATIVE NEGATIVE Final    Comment: (NOTE) If result is NEGATIVE SARS-CoV-2 target nucleic acids are NOT DETECTED. The SARS-CoV-2 RNA is generally detectable in upper and lower  respiratory specimens during the acute phase of infection. The lowest  concentration of SARS-CoV-2 viral copies this assay can detect is 250  copies / mL. A negative result does not preclude SARS-CoV-2 infection  and should not be used as the sole basis for treatment or other  patient management decisions.  A negative result may occur with  improper specimen collection / handling, submission of specimen other  than nasopharyngeal swab, presence of viral mutation(s) within the  areas targeted by this assay, and inadequate number of viral copies  (<250 copies / mL). A negative result must be combined with clinical  observations, patient history, and epidemiological information. If result is POSITIVE SARS-CoV-2 target nucleic acids are DETECTED. The SARS-CoV-2 RNA is generally detectable in upper and lower  respiratory specimens dur ing the acute phase of infection.  Positive  results are indicative of active infection  with SARS-CoV-2.  Clinical  correlation with patient history and other diagnostic information is  necessary to determine patient infection status.  Positive results do  not rule out bacterial infection or co-infection with other viruses. If result is PRESUMPTIVE POSTIVE SARS-CoV-2 nucleic acids MAY BE PRESENT.   A presumptive positive result was obtained on the submitted specimen  and confirmed on repeat testing.  While 2019 novel coronavirus  (SARS-CoV-2) nucleic acids may be present in the submitted sample  additional confirmatory testing may be necessary for epidemiological  and / or clinical management purposes  to differentiate between  SARS-CoV-2 and other Sarbecovirus currently known to infect humans.  If clinically indicated additional testing with an alternate test  methodology [phone]) is advised. The SARS-CoV-2 RNA is generally  detectable in upper and lower respiratory sp ecimens during the acute  phase of infection. The expected result is Negative. Fact Sheet for Patients:  StrictlyIdeas.no Fact Sheet for Healthcare Providers: BankingDealers.co.za This test is not yet approved or cleared by the [place] FDA and has been authorized for detection and/or diagnosis of SARS-CoV-2 by FDA under an Emergency Use Authorization (EUA).  This EUA will remain in effect (meaning this test can be used) for the duration of the COVID-19 declaration under Section 564(b)(1) of the Act, 21 U.S.C. section 360bbb-3(b)(1), unless the authorization is terminated or revoked sooner. Performed at [facility], [address] [place], [place] [address]   Culture, blood (routine x 2)     Status: None (Preliminary result)   Collection Time: 06/08/18  4:01 PM  Result Value Ref Range Status   Specimen Description BLOOD LEFT ANTECUBITAL  Final   Special Requests   Final    BOTTLES DRAWN AEROBIC AND ANAEROBIC Blood Culture adequate volume    Culture   Final    NO GROWTH 2 DAYS Performed at [facility], [address] [place], [place] [address]    Report Status PENDING  Incomplete    RADIOLOGY:  Ct Abdomen Pelvis Wo Contrast  Result Date: 06/08/2018 CLINICAL DATA:  Unexplained altered level of consciousness. Hypotension. EXAM: CT ABDOMEN AND PELVIS WITHOUT CONTRAST TECHNIQUE: Multidetector CT imaging of the abdomen and pelvis was performed following the standard protocol without IV contrast. COMPARISON:  CT scan dated 11/20/2014 FINDINGS: Lower chest: Lung bases are clear. Aortic atherosclerosis. Extensive coronary artery calcifications. Heart size is normal. Hepatobiliary: Multiple stable small hepatic cysts. Cholecystectomy. No biliary dilatation. Pancreas: Unremarkable. No pancreatic ductal dilatation or surrounding inflammatory changes. Spleen: Normal in size without focal abnormality. Adrenals/Urinary Tract: Stable left adrenal adenoma containing small calcifications. Normal right adrenal gland. Stable bilateral renal cysts including a small hyperdense cyst on the posterior aspect of the lower pole of the right kidney. No hydronephrosis. The bladder is empty with a Foley catheter in place. Stomach/Bowel: Small hiatal hernia. The bowel is otherwise normal including the terminal ileum and appendix. Vascular/Lymphatic: Aortic atherosclerosis. No enlarged abdominal or pelvic lymph nodes. Reproductive: Massive chronic enlargement of the prostate gland. Foley catheter in place. The gland measures 9 x 8.4 x 6.3 cm. Other: No abdominal wall hernia or abnormality. No abdominopelvic ascites. Musculoskeletal: No acute abnormality. Diffuse degenerative disc disease throughout the lumbar spine. IMPRESSION: 1. No acute abnormalities of the abdomen or pelvis. 2. Massive chronic enlargement of the prostate gland. Electronically Signed   By: Lorriane Shire M.D.   On: 06/08/2018 17:06   Ct Head Wo Contrast  Result Date: 06/08/2018 CLINICAL  DATA:  Found unresponsive. EXAM: CT HEAD WITHOUT CONTRAST TECHNIQUE: Contiguous  axial images were obtained from the base of the skull through the vertex without intravenous contrast. COMPARISON:  None. FINDINGS: Brain: Age related cerebral atrophy, ventriculomegaly and periventricular white matter disease. No extra-axial fluid collections are identified. No CT findings for acute hemispheric infarction or intracranial hemorrhage. No mass lesions. The brainstem and cerebellum are normal. Vascular: Scattered vascular calcifications but no definite aneurysm or hyperdense vessels. Some gas is noted in the cavernous sinus which is probably from an IV start. Skull: No skull fracture or bone lesions. Moderate hyperostosis frontalis interna noted. Sinuses/Orbits: The paranasal sinuses and mastoid air cells are clear. The middle ear cavities are clear. Other: No scalp lesions or hematoma. IMPRESSION: 1. Age related cerebral atrophy, ventriculomegaly and periventricular white matter disease. 2. No acute intracranial findings or mass lesions. Electronically Signed   By: Marijo Sanes M.D.   On: 06/08/2018 17:57   Dg Chest Port 1 View  Result Date: 06/08/2018 CLINICAL DATA:  Altered mental status.  Vomiting. EXAM: PORTABLE CHEST 1 VIEW COMPARISON:  None. FINDINGS: The heart size and mediastinal contours are within normal limits. Both lungs are clear. Old healed fracture of the mid left clavicle. IMPRESSION: No acute abnormalities. Electronically Signed   By: Lorriane Shire M.D.   On: 06/08/2018 16:40      Management plans discussed with the patient, family and they are in agreement.  CODE STATUS:     Code Status Orders  (From admission, onward)         Start     Ordered   06/08/18 1920  Full code  Continuous     06/08/18 1920        Code Status History    This patient has a current code status but no historical code status.      TOTAL TIME TAKING CARE OF THIS PATIENT: 40 minutes.    Henreitta Leber M.D on 06/10/2018 at 4:24 PM  Between 7am to 6pm - Pager - [phone]  After 6pm go to [email or web address] - Proofreader  [facility]  Office  [phone]  CC: Primary care physician; Albina Billet, MD

## 2018-06-10 NOTE — Plan of Care (Signed)
  Problem: Clinical Measurements: Goal: Ability to maintain clinical measurements within normal limits will improve Outcome: Progressing   Problem: Activity: Goal: Risk for activity intolerance will decrease Outcome: Progressing   Problem: Pain Managment: Goal: General experience of comfort will improve Outcome: Progressing   

## 2018-06-10 NOTE — Care Management Important Message (Signed)
Important Message  Patient Details  Name: Jacob Avila MRN: [id number] Date of Birth: [birth year]   Medicare Important Message Given:  Yes    [name] 06/10/2018, 11:13 AM

## 2018-06-10 NOTE — Progress Notes (Signed)
Patient given discharge instructions with prescriptions. IV's taken out and tele monitor off. Patient verbalized understanding with no questions or concerns. Patient going home via family vehicle.

## 2018-06-10 NOTE — Progress Notes (Signed)
Update given to patient's daughter [name], over the phone.

## 2018-06-13 LAB — CULTURE, BLOOD (ROUTINE X 2)
Culture: NO GROWTH
Culture: NO GROWTH
Special Requests: ADEQUATE
Special Requests: ADEQUATE

## 2018-07-06 ENCOUNTER — Ambulatory Visit: Payer: Medicare Other | Admitting: Urology

## 2019-01-02 ENCOUNTER — Ambulatory Visit: Payer: Medicare Other | Attending: Internal Medicine

## 2019-01-02 ENCOUNTER — Other Ambulatory Visit: Payer: Medicare Other

## 2019-01-02 DIAGNOSIS — Z20822 Contact with and (suspected) exposure to covid-19: Secondary | ICD-10-CM

## 2019-01-04 ENCOUNTER — Telehealth: Payer: Self-pay | Admitting: Internal Medicine

## 2019-01-04 LAB — NOVEL CORONAVIRUS, NAA: SARS-CoV-2, NAA: NOT DETECTED

## 2019-01-04 NOTE — Telephone Encounter (Signed)
Negative COVID results given. Patient results "NOT Detected." Caller expressed understanding. ° °

## 2020-07-30 IMAGING — CT CT HEAD WITHOUT CONTRAST
3 series · 15 of 47 positions shown, 18 images · non-contrast
Comparison: None.

CLINICAL DATA: Found unresponsive.

EXAM:
CT HEAD WITHOUT CONTRAST
TECHNIQUE: Contiguous axial images were obtained from the base of the skull
through the vertex without intravenous contrast.

[Series 2: head wo · axial · 0.47mm/px · z∈[+73,+203]mm · 9 of 32 slices shown, 12 images]
[im 3/32  brain]
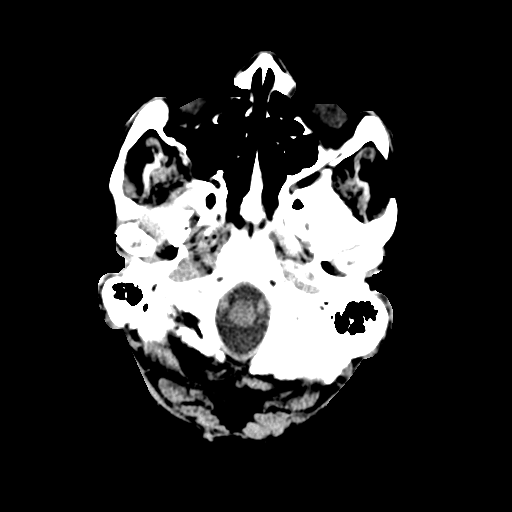
[im 3/32  bone]
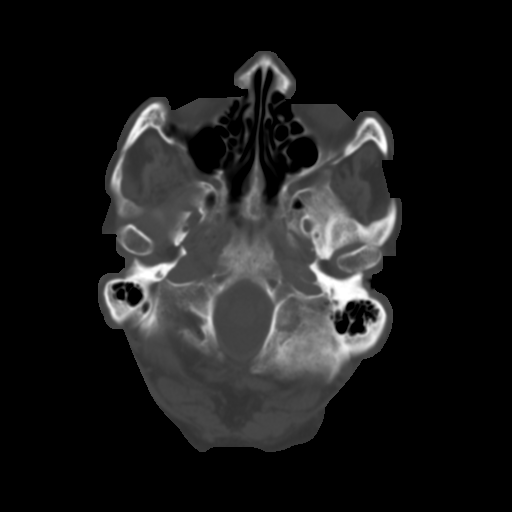
[im 6/32  brain]
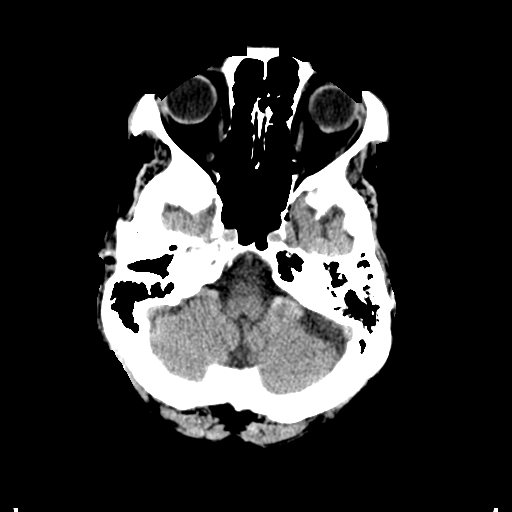
[im 9/32  brain]
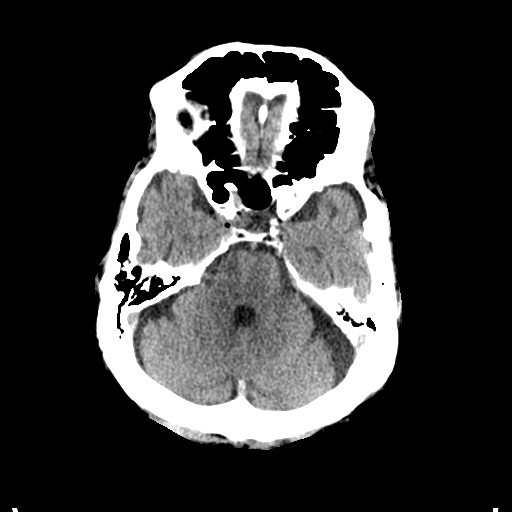
[im 12/32  brain]
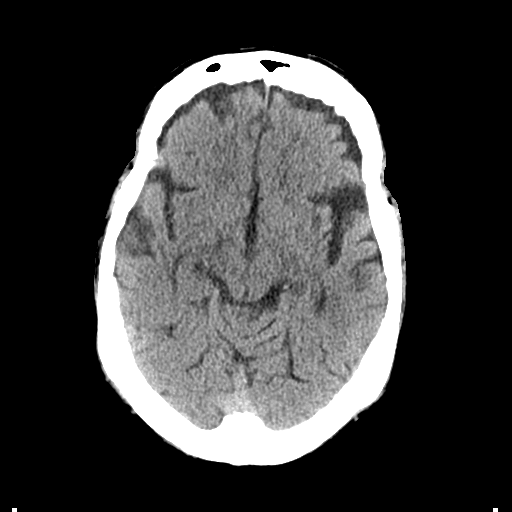
[im 17/32  brain]
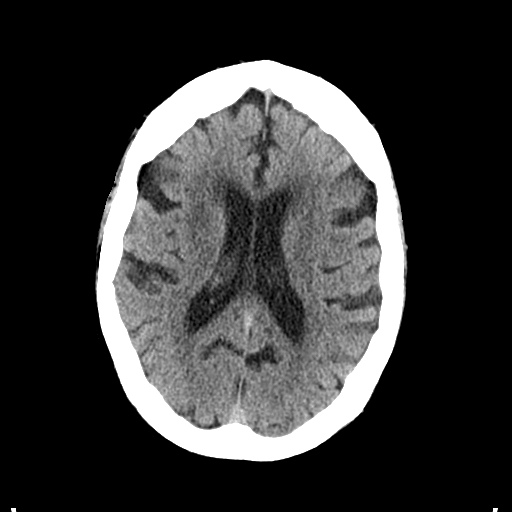
[im 17/32  bone]
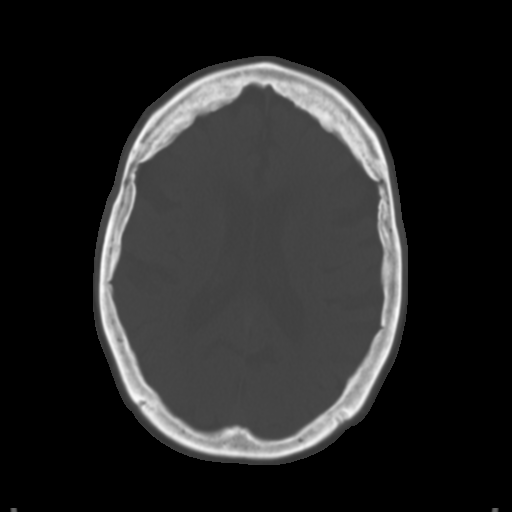
[im 20/32  brain]
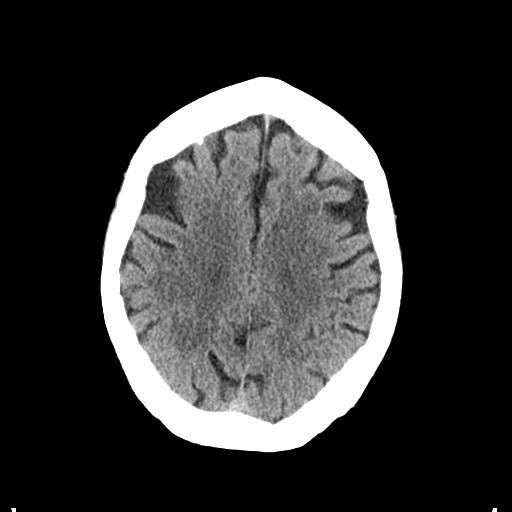
[im 23/32  brain]
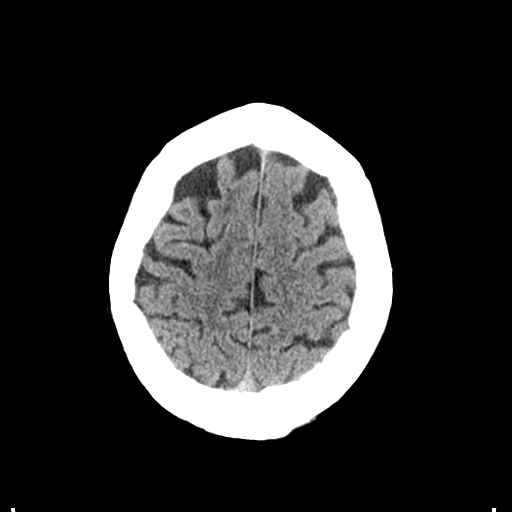
[im 26/32  brain]
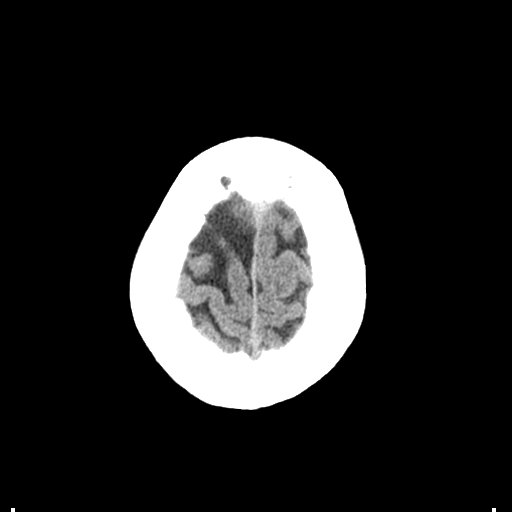
[im 29/32  brain]
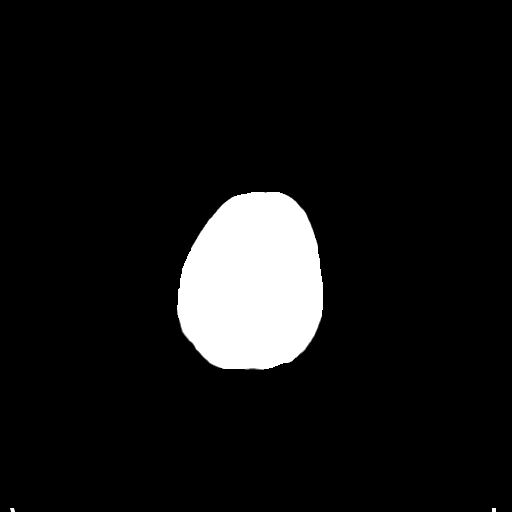
[im 29/32  bone]
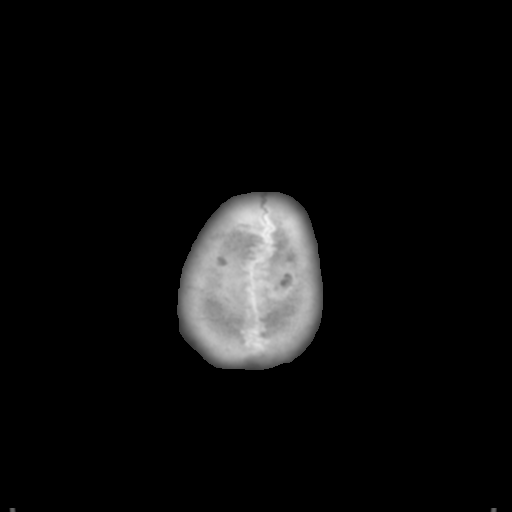

[Series 4: coronal soft tissue · coronal · 0.31mm/px · 3 of 66 slices shown]
[im 22/66  brain]
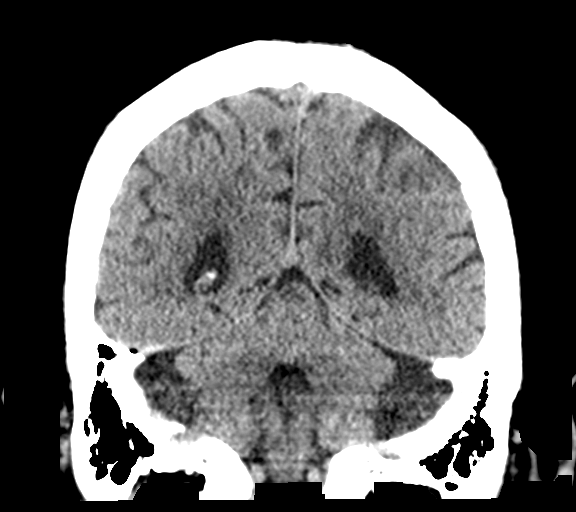
[im 29/66  brain]
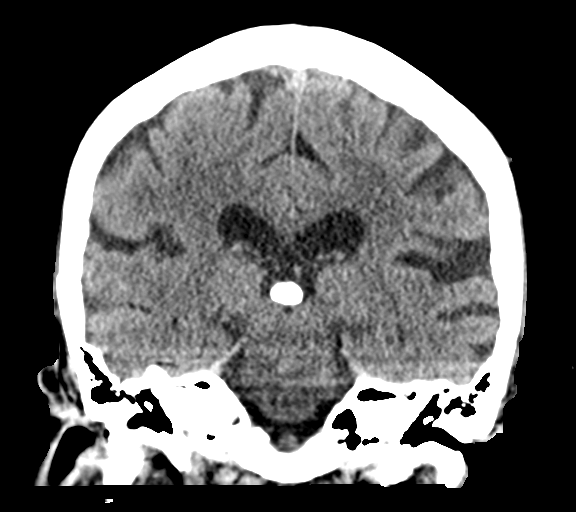
[im 37/66  brain]
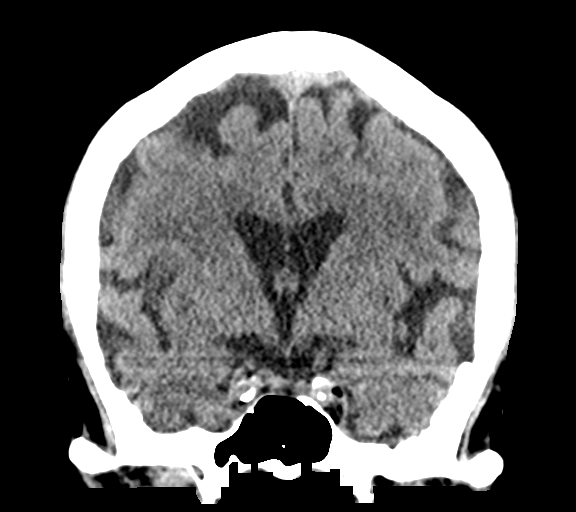

[Series 5: sagittal soft tissue · sagittal · 0.31mm/px · 3 of 53 slices shown]
[im 18/53  brain]
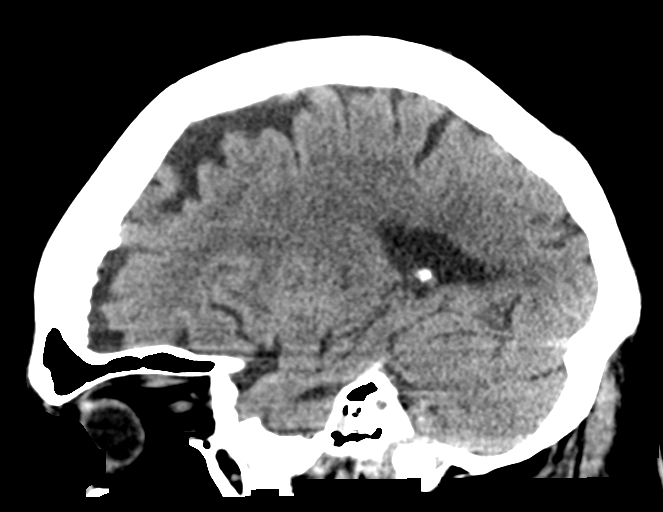
[im 27/53  brain]
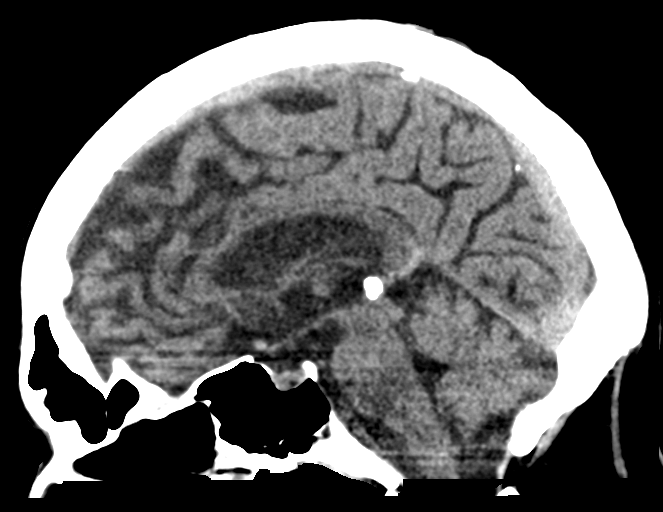
[im 35/53  brain]
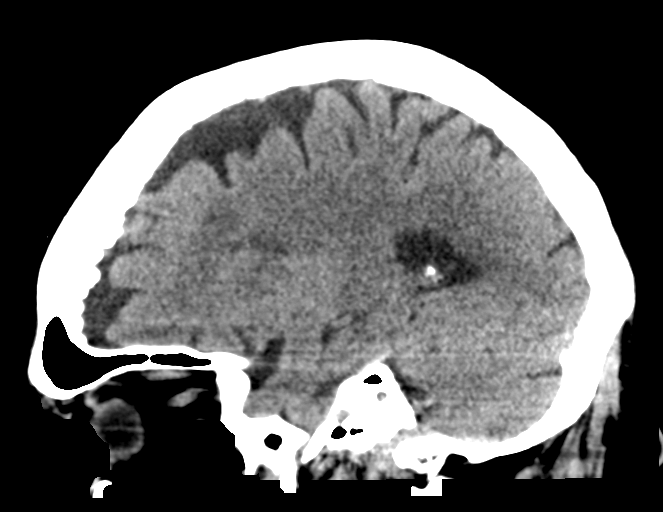

[15 of 47 positions shown; findings below may reference images not displayed]

FINDINGS: Brain: Age related cerebral atrophy, ventriculomegaly and
periventricular white matter disease. No extra-axial fluid
collections are identified. No CT findings for acute hemispheric
infarction or intracranial hemorrhage. No mass lesions. The
brainstem and cerebellum are normal.

Vascular: Scattered vascular calcifications but no definite aneurysm
or hyperdense vessels. Some gas is noted in the cavernous sinus
which is probably from an IV start.

Skull: No skull fracture or bone lesions. Moderate hyperostosis
frontalis interna noted.

Sinuses/Orbits: The paranasal sinuses and mastoid air cells are
clear. The middle ear cavities are clear.

Other: No scalp lesions or hematoma.
IMPRESSION: 1. Age related cerebral atrophy, ventriculomegaly and
periventricular white matter disease.
2. No acute intracranial findings or mass lesions.

## 2020-07-30 IMAGING — DX PORTABLE CHEST - 1 VIEW
1 series · 1 of 1 positions shown · non-contrast
Comparison: None.

CLINICAL DATA: Altered mental status.  Vomiting.

EXAM:
PORTABLE CHEST 1 VIEW

[chest ap]
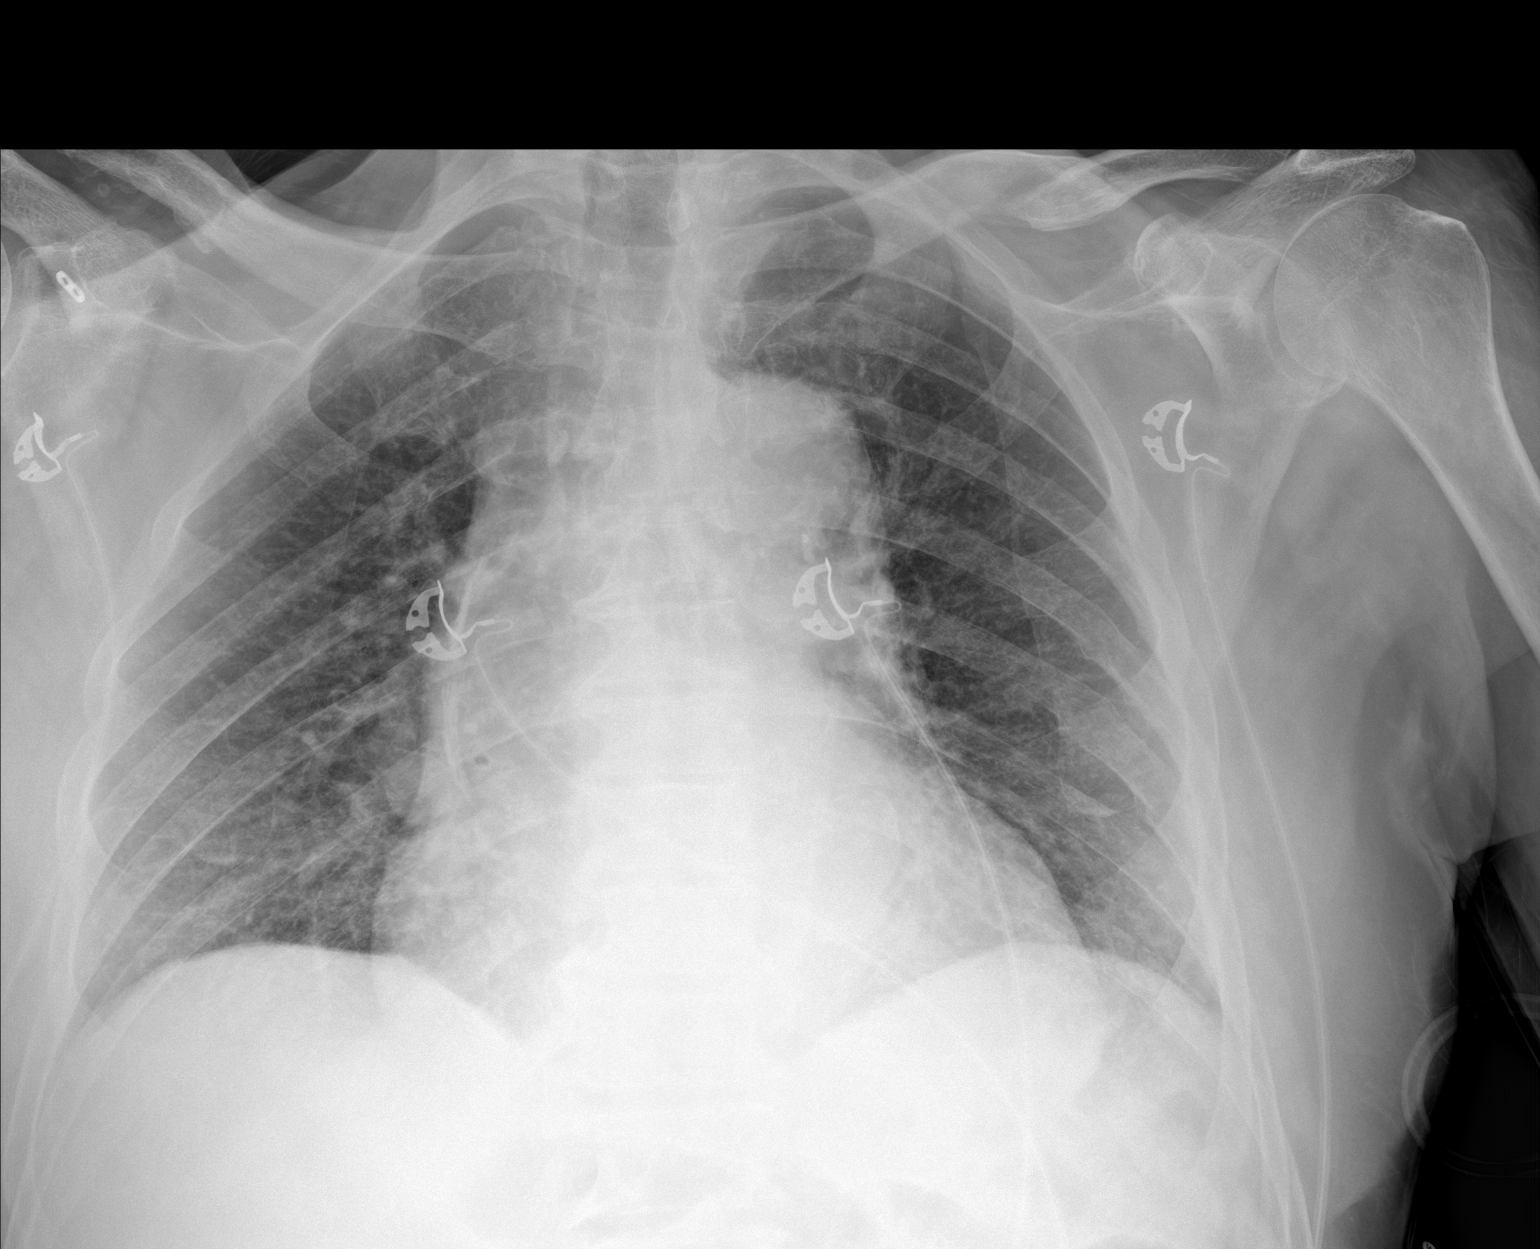

[1 of 1 positions shown; findings below may reference images not displayed]

FINDINGS: The heart size and mediastinal contours are within normal limits.
Both lungs are clear. Old healed fracture of the mid left clavicle.
IMPRESSION: No acute abnormalities.

## 2021-08-25 ENCOUNTER — Ambulatory Visit: Payer: Medicare Other | Admitting: Urology

## 2021-08-26 ENCOUNTER — Ambulatory Visit: Payer: Medicare Other | Admitting: Urology

## 2021-08-27 ENCOUNTER — Ambulatory Visit (INDEPENDENT_AMBULATORY_CARE_PROVIDER_SITE_OTHER): Payer: PPO | Admitting: Urology

## 2021-08-27 ENCOUNTER — Encounter: Payer: Self-pay | Admitting: Urology

## 2021-08-27 VITALS — BP 90/56 | HR 112 | Ht 72.0 in | Wt 185.8 lb

## 2021-08-27 DIAGNOSIS — R339 Retention of urine, unspecified: Secondary | ICD-10-CM | POA: Diagnosis not present

## 2021-08-27 DIAGNOSIS — N401 Enlarged prostate with lower urinary tract symptoms: Secondary | ICD-10-CM | POA: Diagnosis not present

## 2021-08-27 DIAGNOSIS — N138 Other obstructive and reflux uropathy: Secondary | ICD-10-CM

## 2021-08-27 MED ORDER — TAMSULOSIN HCL 0.4 MG PO CAPS: 0.4000 mg | ORAL_CAPSULE | Freq: Every day | ORAL | 11 refills | Status: DC

## 2021-08-27 NOTE — Patient Instructions (Signed)

## 2021-08-27 NOTE — Progress Notes (Signed)
   08/27/21 12:14 PM   Jacob Avila [birth year] [id number]  CC: Urinary retention, BPH  HPI: I saw Jacob Avila and his daughter today for evaluation of urinary retention.  His daughter provides much of the history.  He is a 88 year old male who presented to [facility] on 08/14/2021 with lower abdominal pain and urinary retention.  He denies that he had significant urinary symptoms prior to that visit, but his daughter thinks he has been having trouble voiding for a while.  CT scan at that time showed a distended bladder with significant BPH measuring 220 g, and stable left adrenal nodule.  Urinalysis at that time showed microscopic hematuria but was otherwise benign.  He is anticoagulated with Xarelto.  He has had a number of problems with the catheter including leakage and gross hematuria, but no problems over the last 4 to 5 days.  He does not take any prostate medications.  He previously was followed by Dr. Erlene Quan and last seen in 2017 when he had a cystoscopy that showed massively enlarged prostate with a median lobe and significant hypertrophy.   PMH: Past Medical History:  Diagnosis Date   1st degree AV block 05/13/2012   Arteriosclerosis of coronary artery 11/12/2014   Atrial fibrillation ([place]) 11/30/2016   BP (high blood pressure) 05/12/2012   Bradycardia 05/12/2012   Dizziness 05/12/2012   HLD (hyperlipidemia)     Surgical History: Past Surgical History:  Procedure Laterality Date   CHOLECYSTECTOMY  2008   SHOULDER SURGERY  2005   Family History: No family history on file.  Social History:  reports that he quit smoking about 43 years ago. His smoking use included cigarettes. He has never used smokeless tobacco. He reports that he does not drink alcohol and does not use drugs.  Physical Exam: BP (!) 90/56 (BP Location: Left Arm, Patient Position: Sitting, Cuff Size: Normal)   Pulse (!) 112   Ht 6' (1.829 m)   Wt 185 lb 12.8 oz (84.3 kg)   BMI 25.20 kg/m    Constitutional:   Alert and oriented, No acute distress. Cardiovascular: No clubbing, cyanosis, or edema. Respiratory: Normal respiratory effort, no increased work of breathing. GI: Abdomen is soft, nontender, nondistended, no abdominal masses GU: Foley with clear yellow urine  Laboratory Data: Review  Pertinent Imaging: I have personally viewed and interpreted the CT abdomen pelvis with contrast from [facility] showing a distended bladder and enlarged prostate measuring 220 g.  Assessment & Plan:   88 year old male with massive BPH and prostate measuring 220 g who recently developed urinary retention with lower abdominal pain and catheter was placed at [facility].  We discussed BPH as the most likely etiology of his urinary symptoms and urinary retention.  I recommended starting Flomax prior to a trial of void, and they are very interested in having the catheter removed as soon as possible.  I think it is not unreasonable to start Flomax this morning, and see him back tomorrow morning and afternoon for a voiding trial and PVR.  Risks and benefits discussed at length.  He is very averse to surgery, but could consider HOLEP in the future if persistent urinary retention  -Start Flomax -RTC tomorrow for voiding trial-> if fails voiding trial will repeat in 2 weeks after starting Flomax prior to considering [place], MD 08/27/2021  [facility] [address], [place], [place] [address] [phone]

## 2021-08-28 ENCOUNTER — Ambulatory Visit: Payer: PPO | Admitting: Urology

## 2021-08-28 ENCOUNTER — Encounter: Payer: Self-pay | Admitting: Urology

## 2021-08-28 ENCOUNTER — Ambulatory Visit (INDEPENDENT_AMBULATORY_CARE_PROVIDER_SITE_OTHER): Payer: PPO | Admitting: Urology

## 2021-08-28 VITALS — Ht 72.0 in | Wt 185.0 lb

## 2021-08-28 DIAGNOSIS — N401 Enlarged prostate with lower urinary tract symptoms: Secondary | ICD-10-CM | POA: Diagnosis not present

## 2021-08-28 DIAGNOSIS — N138 Other obstructive and reflux uropathy: Secondary | ICD-10-CM

## 2021-08-28 DIAGNOSIS — R339 Retention of urine, unspecified: Secondary | ICD-10-CM | POA: Diagnosis not present

## 2021-08-28 LAB — BLADDER SCAN AMB NON-IMAGING

## 2021-08-28 MED ORDER — SULFAMETHOXAZOLE-TRIMETHOPRIM 800-160 MG PO TABS
1.0000 | ORAL_TABLET | Freq: Once | ORAL | Status: AC
  Administered 2021-08-28: 1 via ORAL

## 2021-08-28 NOTE — Progress Notes (Signed)
   08/28/2021 8:26 AM   Gamble Jaclyn Prime 1927-01-18 [id number]  Reason for visit: Follow up BPH, urinary retention  HPI: 88 year old male who I saw yesterday originally after he developed urinary retention on 08/14/2021 with lower abdominal pain and presented to the [facility] ER.  CT at that time showed a 220 g prostate, stable left adrenal nodule, and a distended bladder.  Urinalysis showed microscopic hematuria but was otherwise benign.  He is anticoagulated with Xarelto.  He started Flomax last night, and presents today for voiding trial.  Foley was removed today, Bactrim was given for prophylaxis.  He returned this afternoon and has not been able to void despite consuming large amounts of fluids, bladder scan elevated at 500 mL.  He denies any lower abdominal pain.  We discussed options including discharge without Foley with high risk of developing recurrent retention that would likely necessitate an ER visit, versus replacing Foley today and repeating void trial in 1 to 2 weeks.  He opted for Foley replacement.  Please see Foley placement note, urine amber without clots.  We also discussed long-term bladder management options if he continues to be unable to void including chronic Foley with monthly changes, intermittent catheterization, or an outlet procedure like HOLEP in the setting of his significant BPH(220 g prostate).  He is not willing to have a Foley long-term and would be interested in an outlet procedure options.  Continue Flomax RTC 1 to 2 weeks voiding trial, if fails voiding trial again will [place], MD  [employer] [address], [place], [place] [address] [phone]

## 2021-08-28 NOTE — Progress Notes (Signed)
Patient ID: Jacob Avila, male   DOB: [birth year], 88 y.o.   MRN: [id number] Catheter Removal  Patient is present today for a catheter removal.  61m of water was drained from the balloon. A 18FR foley cath was removed from the bladder no complications were noted . Patient tolerated well.  Performed by: DEdwin Dada CMA  Follow up/ Additional notes: this afternoon for PVR   Simple Catheter Placement  Due to urinary retention patient is present today for a foley cath placement.  Patient was cleaned and prepped in a sterile fashion with betadine. A 20 FR coude foley catheter was inserted, urine return was noted  4034m urine was dark amber in color.  The balloon was filled with 10cc of sterile water.  A leg bag was attached for drainage. Patient was also given a night bag to take home and was given instruction on how to change from one bag to another.  Patient was given instruction on proper catheter care.  Patient tolerated well, no complications were noted   Performed by: DeEdwin DadaCMA  Additional notes/ Follow up: 3 weeks

## 2021-08-28 NOTE — Patient Instructions (Signed)

## 2021-08-29 ENCOUNTER — Emergency Department
Admission: EM | Admit: 2021-08-29 | Discharge: 2021-08-29 | Disposition: A | Discharge status: Home / Self Care | Payer: PPO | Attending: Emergency Medicine | Admitting: Emergency Medicine

## 2021-08-29 ENCOUNTER — Other Ambulatory Visit: Payer: Self-pay

## 2021-08-29 ENCOUNTER — Telehealth: Payer: Self-pay | Admitting: *Deleted

## 2021-08-29 DIAGNOSIS — R339 Retention of urine, unspecified: Secondary | ICD-10-CM | POA: Diagnosis not present

## 2021-08-29 DIAGNOSIS — T83091A Other mechanical complication of indwelling urethral catheter, initial encounter: Secondary | ICD-10-CM | POA: Diagnosis present

## 2021-08-29 NOTE — ED Notes (Signed)
Patient discharged at this time. wheeled to lobby with family and assisted into vehicle. Breathing unlabored speaking in full sentences. Verbalized understanding of all discharge, follow up, and foley teaching. Pt provided with leg bag for home per request and pt and daughter verbalized and demonstrated understanding of foley care and bag transition. Patient  discharged homed with all belongings.

## 2021-08-29 NOTE — ED Notes (Signed)
Called for the urology cart. Will attempt foley placement when arrives.

## 2021-08-29 NOTE — ED Notes (Signed)
MD notified of successful insertion

## 2021-08-29 NOTE — ED Notes (Signed)
Attempted bladder irrigation with a small amount d/t the large amount of urine per MD. No drainage and unable to aspirate with syringe. Pt has a 20 fr catheter in place. MD notified.

## 2021-08-29 NOTE — Discharge Instructions (Addendum)
Call Dr. Doristine Counter office later this morning to let them know that you were seen in the ER and that the Foley clogged again.  Return to the ER for recurrent clogging, obstruction, inability urinate, abdominal or flank pain, fever, or any other new or worsening symptoms that concern you.

## 2021-08-29 NOTE — ED Notes (Signed)
Signing pad did not work, pt verbalized understanding of DC instructions and returned demonstration of emptying the catheter.

## 2021-08-29 NOTE — ED Triage Notes (Signed)
Pt reports chronic foley in place. Reports had foley replaced this afternoon and now it is no longer draining. Reports no output since 1630 today when placed.

## 2021-08-29 NOTE — ED Provider Notes (Signed)
[facility] Provider Note    Event Date/Time   First MD Initiated Contact with Patient 08/29/21 0036     (approximate)   History   Urinary Retention   HPI  Jacob Avila is a 88 y.o. male with history of BPH who presents with urinary retention.  He states that his Foley catheter was replaced in the afternoon and is not draining.  He has had increased pressure and pain to his suprapubic area consistent with retention.  He denies other acute symptoms.    Physical Exam   Triage Vital Signs: ED Triage Vitals [08/29/21 0016]  Enc Vitals Group     BP 91/69     Pulse Rate (!) 105     Resp 18     Temp 98.1 F (36.7 C)     Temp Source Oral     SpO2 99 %     Weight 185 lb (83.9 kg)     Height 6' (1.829 m)     Head Circumference      Peak Flow      Pain Score 0     Pain Loc      Pain Edu?      Excl. in [place]?     Most recent vital signs: Vitals:   08/29/21 0016 08/29/21 0240  BP: 91/69   Pulse: (!) 105 94  Resp: 18 18  Temp: 98.1 F (36.7 C)   SpO2: 99% 99%     General: Alert and oriented, comfortable appearing. CV:  Good peripheral perfusion.  Resp:  Normal effort.  Abd:  Soft and nontender.  No distention.  Other:  Normal external genitalia.  Foley catheter in place.   ED Results / Procedures / Treatments   Labs (all labs ordered are listed, but only abnormal results are displayed) Labs Reviewed - No data to display   EKG     RADIOLOGY     PROCEDURES:  Critical Care performed: No  Procedures   MEDICATIONS ORDERED IN ED: Medications - No data to display   IMPRESSION / MDM / [place] / ED COURSE  I reviewed the triage vital signs and the nursing notes.  88 year old male with PMH as noted above presents with urinary retention due to a clogged Foley catheter.  I viewed the past medical records.  The patient was seen by Dr. Diamantina Providence from urology yesterday.  He has been having urinary retention since  8/10 and has been diagnosed with BPH.  Urology attempted to remove his Foley yesterday but he was unable to void and had to be replaced.  He was started on Bactrim.  Differential diagnosis includes, but is not limited to, Foley catheter obstruction, other malfunction, other urinary retention  Patient's presentation is most consistent with exacerbation of chronic illness.  The Foley catheter was replaced by the RN.  The patient's symptoms resolved completely.  He is now asymptomatic and the Foley is draining clear urine.  He is already on the antibiotic.  At this time there is no indication for further ED work-up or management.  He is stable for discharge home.  I advised him to call urology in the morning for follow-up and to arrange any further treatment.  I gave him and his family member thorough return precautions and they expressed understanding.   FINAL CLINICAL IMPRESSION(S) / ED DIAGNOSES   Final diagnoses:  Obstruction of Foley catheter, initial encounter [facility])     Rx / DC Orders   ED  Discharge Orders     None        Note:  This document was prepared using Dragon voice recognition software and may include unintentional dictation errors.    Arta Silence, MD 08/29/21 [phone]

## 2021-08-29 NOTE — Telephone Encounter (Signed)
Patient's daughter called to report patient was in the ER last night-foley was not draining-different foley was placed. Please advise if need to be seen sooner or keep 09/18/21.

## 2021-09-02 NOTE — Telephone Encounter (Signed)
Called pt's daughter, she states that patient is doing well now after having 22FR placed in the ED. Catheter is draining well. Will keep appt for voiding trial.

## 2021-09-18 ENCOUNTER — Ambulatory Visit (INDEPENDENT_AMBULATORY_CARE_PROVIDER_SITE_OTHER): Payer: PPO | Admitting: Urology

## 2021-09-18 ENCOUNTER — Encounter: Payer: Self-pay | Admitting: Urology

## 2021-09-18 ENCOUNTER — Ambulatory Visit: Payer: PPO | Admitting: Urology

## 2021-09-18 VITALS — BP 86/58 | HR 64 | Ht 72.0 in | Wt 185.0 lb

## 2021-09-18 DIAGNOSIS — N401 Enlarged prostate with lower urinary tract symptoms: Secondary | ICD-10-CM | POA: Diagnosis not present

## 2021-09-18 DIAGNOSIS — N138 Other obstructive and reflux uropathy: Secondary | ICD-10-CM

## 2021-09-18 LAB — BLADDER SCAN AMB NON-IMAGING

## 2021-09-18 MED ORDER — CEPHALEXIN 250 MG PO CAPS
500.0000 mg | ORAL_CAPSULE | Freq: Once | ORAL | Status: AC
  Administered 2021-09-18: 500 mg via ORAL

## 2021-09-18 NOTE — Progress Notes (Signed)
   09/18/2021 9:03 AM   Snyder Jaclyn Prime 04-Oct-1927 [id number]  Reason for visit: Follow up urinary retention  HPI: 88 year old frail male who originally presented to the [facility] ER with urinary retention on 08/14/2021.  CT at that time showed a 220 g prostate, stable left adrenal nodule, and distended bladder.  He is anticoagulated at baseline with Xarelto.  He failed a voiding trial on 08/28/2021, however he had only been on Flomax for less than 24 hours at that time.  Using shared decision making, patient and his daughter opted for a repeat voiding trial today.  He also had an ER visit on 08/29/2021 for nondraining Foley, and this was upsized to a 40 [place] catheter which has been draining better.  Foley was removed today, and Keflex was given for prophylaxis.  He returned this afternoon and denies any urge to void, but bladder scan only shows 270 mL.  We discussed options including Foley replacement today, versus ongoing trial of void.  He understands the risk of developing retention that could necessitate an ER visit overnight.  He is amenable to returning in the morning for PVR.  Risks and benefits discussed extensively.  With his age and comorbidities, I think it is reasonable to at least give him a real chance to try and void with a full bladder prior to replacing the catheter.  Patient and his daughter are in agreement.  RTC tomorrow a.m. for PVR, would only replace Foley if true PVR is significantly elevated greater than 200-332m or significant discomfort We will need to consider HOLEP if remains in Foley dependent urinary retention   BBilley Co MD  [place] [place] [phone]

## 2021-09-18 NOTE — Progress Notes (Signed)
Patient ID: Jacob Avila, male   DOB: Jul 19, 1927, 88 y.o.   MRN: [id number] Catheter Removal  Patient is present today for a catheter removal.  65m of water was drained from the balloon. A 22FR foley cath was removed from the bladder, no complications were noted. Patient tolerated well.  Performed by: DEdwin Dada CMA  Follow up/ Additional notes: this afternoon at 3:15pm

## 2021-09-19 ENCOUNTER — Ambulatory Visit: Payer: PPO | Admitting: Physician Assistant

## 2021-09-19 ENCOUNTER — Other Ambulatory Visit: Payer: Self-pay | Admitting: *Deleted

## 2021-09-19 ENCOUNTER — Other Ambulatory Visit: Payer: Self-pay

## 2021-09-19 ENCOUNTER — Encounter: Payer: Self-pay | Admitting: Emergency Medicine

## 2021-09-19 ENCOUNTER — Emergency Department
Admission: EM | Admit: 2021-09-19 | Discharge: 2021-09-19 | Disposition: A | Discharge status: Home / Self Care | Payer: PPO | Attending: Emergency Medicine | Admitting: Emergency Medicine

## 2021-09-19 DIAGNOSIS — R339 Retention of urine, unspecified: Secondary | ICD-10-CM | POA: Diagnosis present

## 2021-09-19 DIAGNOSIS — I10 Essential (primary) hypertension: Secondary | ICD-10-CM | POA: Insufficient documentation

## 2021-09-19 DIAGNOSIS — I2510 Atherosclerotic heart disease of native coronary artery without angina pectoris: Secondary | ICD-10-CM | POA: Diagnosis not present

## 2021-09-19 DIAGNOSIS — N390 Urinary tract infection, site not specified: Secondary | ICD-10-CM

## 2021-09-19 DIAGNOSIS — Z79899 Other long term (current) drug therapy: Secondary | ICD-10-CM | POA: Diagnosis not present

## 2021-09-19 DIAGNOSIS — Z7901 Long term (current) use of anticoagulants: Secondary | ICD-10-CM | POA: Insufficient documentation

## 2021-09-19 DIAGNOSIS — R338 Other retention of urine: Secondary | ICD-10-CM

## 2021-09-19 LAB — URINALYSIS, ROUTINE W REFLEX MICROSCOPIC
Bilirubin Urine: NEGATIVE
Glucose, UA: NEGATIVE mg/dL
Ketones, ur: NEGATIVE mg/dL
Nitrite: NEGATIVE
Protein, ur: 30 mg/dL — AB
RBC / HPF: >50 RBC/hpf — ABNORMAL HIGH (ref 0–5)
Specific Gravity, Urine: 1.009 (ref 1.005–1.030)
Squamous Epithelial / HPF: NONE SEEN (ref 0–5)
WBC, UA: >50 WBC/hpf — ABNORMAL HIGH (ref 0–5)
pH: 6 (ref 5.0–8.0)

## 2021-09-19 MED ORDER — CEFDINIR 300 MG PO CAPS: 300.0000 mg | ORAL_CAPSULE | Freq: Two times a day (BID) | ORAL | 0 refills | Status: DC

## 2021-09-19 MED ORDER — CEFDINIR 300 MG PO CAPS
300.0000 mg | ORAL_CAPSULE | Freq: Once | ORAL | Status: AC
  Administered 2021-09-19: 300 mg via ORAL
  Filled 2021-09-19: qty 1

## 2021-09-19 NOTE — ED Notes (Signed)
Leg bag placed per patient request

## 2021-09-19 NOTE — ED Notes (Signed)
Bladder scan preformed.  661 mL urine resulted.  MD made aware

## 2021-09-19 NOTE — Progress Notes (Signed)
Pt walked in the clinic thinking he still needed to come in, appt was canceled, pt have catheter. Also pt wanted to have prescription sent to pharmacy, re-sent prescription.

## 2021-09-19 NOTE — ED Triage Notes (Signed)
Patient ambulatory to triage with steady gait, without difficulty or distress noted; pt had foley removed at urology yesterday and has not been able to void since

## 2021-09-19 NOTE — Discharge Instructions (Addendum)
Please continue your Flomax as prescribed.

## 2021-09-19 NOTE — ED Provider Notes (Signed)
[facility] Provider Note    Event Date/Time   First MD Initiated Contact with Patient 09/19/21 [phone]     (approximate)   History   Urinary Retention   HPI  Jacob Avila is a 88 y.o. male with history of CAD, atrial fibrillation on Xarelto, hypertension, hyperlipidemia, urinary retention due to BPH who presents to the emergency department with urinary retention.  States he has not urinated on his own in over a week.  He had a Foley catheter removed yesterday in the urology clinic and states he has not been able to urinate since.  Starting to feel full and uncomfortable.  Patient on Flomax.   History provided by patient.    Past Medical History:  Diagnosis Date   1st degree AV block 05/13/2012   Arteriosclerosis of coronary artery 11/12/2014   Atrial fibrillation ([place]) 11/30/2016   BP (high blood pressure) 05/12/2012   Bradycardia 05/12/2012   Dizziness 05/12/2012   HLD (hyperlipidemia)     Past Surgical History:  Procedure Laterality Date   CHOLECYSTECTOMY  2008   SHOULDER SURGERY  2005    MEDICATIONS:  Prior to Admission medications   Medication Sig Start Date End Date Taking? Authorizing Provider  atorvastatin (LIPITOR) 80 MG tablet Take by mouth. 05/02/21   [provider]  ferrous sulfate 325 (65 FE) MG tablet Take by mouth.    [provider]  furosemide (LASIX) 20 MG tablet Take 40 mg by mouth daily.    [provider]  lisinopril (ZESTRIL) 10 MG tablet Take 10 mg by mouth daily. 05/24/18   [provider]  metoprolol succinate (TOPROL-XL) 25 MG 24 hr tablet Take 1 tablet by mouth daily. 05/08/21 05/08/22  [provider]  Rivaroxaban (XARELTO) 15 MG TABS tablet Take by mouth. 03/20/21 03/20/22  [provider]  sacubitril-valsartan (ENTRESTO) 24-26 MG Take 1 tablet by mouth 2 (two) times daily. 01/23/21   [provider]  spironolactone (ALDACTONE) 25 MG tablet Take 25 mg by mouth  daily. 12/10/17 08/27/21  [provider]  tamsulosin (FLOMAX) 0.4 MG CAPS capsule Take 1 capsule (0.4 mg total) by mouth daily. 08/27/21   Billey Co, MD    Physical Exam   Triage Vital Signs: ED Triage Vitals  Enc Vitals Group     BP 09/19/21 0318 107/72     Pulse Rate 09/19/21 0318 82     Resp 09/19/21 0318 20     Temp 09/19/21 0318 97.6 F (36.4 C)     Temp Source 09/19/21 0318 Oral     SpO2 09/19/21 0318 100 %     Weight 09/19/21 0316 185 lb (83.9 kg)     Height 09/19/21 0316 6' (1.829 m)     Head Circumference --      Peak Flow --      Pain Score 09/19/21 0316 8     Pain Loc --      Pain Edu? --      Excl. in [place]? --     Most recent vital signs: Vitals:   09/19/21 0400 09/19/21 0430  BP: 100/73 102/65  Pulse: 63 61  Resp: 20 18  Temp:  97.8 F (36.6 C)  SpO2: 100% 99%    CONSTITUTIONAL: Alert and oriented and responds appropriately to questions. Well-appearing; well-nourished, elderly but appears younger than stated age HEAD: Normocephalic, atraumatic EYES: Conjunctivae clear, pupils appear equal, sclera nonicteric ENT: normal nose; moist mucous membranes NECK: Supple, normal ROM  CARD: RRR; S1 and S2 appreciated; no murmurs, no clicks, no rubs, no gallops RESP: Normal chest excursion without splinting or tachypnea; breath sounds clear and equal bilaterally; no wheezes, no rhonchi, no rales, no hypoxia or respiratory distress, speaking full sentences ABD/GI: Normal bowel sounds; normally distended and tender in the suprapubic area BACK: The back appears normal EXT: Normal ROM in all joints; no deformity noted, no edema; no cyanosis SKIN: Normal color for age and race; warm; no rash on exposed skin NEURO: Moves all extremities equally, normal speech PSYCH: The patient's mood and manner are appropriate.   ED Results / Procedures / Treatments   LABS: (all labs ordered are listed, but only abnormal results are displayed) Labs Reviewed  URINALYSIS,  ROUTINE W REFLEX MICROSCOPIC - Abnormal; Notable for the following components:      Result Value   Color, Urine YELLOW (*)    APPearance CLOUDY (*)    Hgb urine dipstick LARGE (*)    Protein, ur 30 (*)    Leukocytes,Ua LARGE (*)    RBC / HPF >50 (*)    WBC, UA >50 (*)    Bacteria, UA FEW (*)    All other components within normal limits  URINE CULTURE     EKG:   RADIOLOGY: My personal review and interpretation of imaging:    I have personally reviewed all radiology reports.   No results found.   PROCEDURES:  Critical Care performed: No    Procedures    IMPRESSION / MDM / ASSESSMENT AND PLAN / ED COURSE  I reviewed the triage vital signs and the nursing notes.    Patient here with recurrent urinary retention.  We will BladderScan and likely replace Foley catheter.     DIFFERENTIAL DIAGNOSIS (includes but not limited to):   Acute urinary retention likely from BPH, UTI, doubt colitis, diverticulitis, appendicitis, bowel obstruction   Patient's presentation is most consistent with acute presentation with potential threat to life or bodily function.   PLAN: We will obtain bladder scan.  If he is retaining, will place Foley catheter.  We will send urinalysis, urine culture.   MEDICATIONS GIVEN IN ED: Medications  cefdinir (OMNICEF) capsule 300 mg (has no administration in time range)     ED COURSE: Patient had over 600 mL on bladder scan.  We will place 80 French Foley catheter given this is what he had previously and patient felt like it was draining better than the smaller catheter.  He has an appointment to see urology in the morning.   Patient put on over a liter of blood-tinged urine.  States he is feeling much better.  No clots seen.  Urinalysis shows large leukocytes, greater than 50 red blood cells, greater than 50 white blood cells and few bacteria with no squamous cells.  Nitrite negative.  Discussed with patient that these findings could be secondary  to recently having a Foley catheter in place but will cover for UTI with cefdinir.  Culture is pending.  No previous positive urine cultures for review in our system or care everywhere.  CONSULTS: No emergent urologic consultation needed this time.  Foley catheter placed without incident and patient is now draining well and feels much better.  Has urology follow-up in the morning.   OUTSIDE RECORDS REVIEWED: Reviewed patient's last urology note yesterday.  Patient had a 68 French Foley catheter removed.  Urologist notes patient returned that afternoon and bladder scan showed 270 mL but denied any urge to void.  They discussed Foley replacement versus ongoing voiding trial and risks of developing retention that could necessitate ER visit overnight.  Plan was to return in the morning for postvoid residual.  Urologist recommends only replacing Foley if postvoid residual is greater than 2 to 300 mL or significant discomfort.       FINAL CLINICAL IMPRESSION(S) / ED DIAGNOSES   Final diagnoses:  Acute urinary retention  Acute UTI     Rx / DC Orders   ED Discharge Orders          Ordered    cefdinir (OMNICEF) 300 MG capsule  2 times daily        09/19/21 0439             Note:  This document was prepared using Dragon voice recognition software and may include unintentional dictation errors.   Mahrosh Donnell, Delice Bison, DO 09/19/21 [phone]

## 2021-09-20 ENCOUNTER — Emergency Department
Admission: EM | Admit: 2021-09-20 | Discharge: 2021-09-20 | Disposition: A | Discharge status: Home / Self Care | Payer: PPO | Attending: Emergency Medicine | Admitting: Emergency Medicine

## 2021-09-20 ENCOUNTER — Other Ambulatory Visit: Payer: Self-pay

## 2021-09-20 DIAGNOSIS — T839XXA Unspecified complication of genitourinary prosthetic device, implant and graft, initial encounter: Secondary | ICD-10-CM | POA: Insufficient documentation

## 2021-09-20 DIAGNOSIS — I509 Heart failure, unspecified: Secondary | ICD-10-CM | POA: Diagnosis not present

## 2021-09-20 LAB — URINE CULTURE: Culture: <10000 — AB

## 2021-09-20 NOTE — Discharge Instructions (Addendum)
Your bladder catheter appears to be functioning normally, draining adequately after repositioning the drainage bag and flushing.  When attaching the drainage bag, be sure that the catheter is not kinked or coiled and is able to drain in a straight, downward fashion with gravity.  Continue taking all of your medications as previously prescribed.

## 2021-09-20 NOTE — ED Notes (Addendum)
Patient tolerated flushing of Foley catheter well. No resistance noted. Approximately 4m of NS flushed through existing catheter. Leg bag changed. Prior to flushing, urine was amber in the bag. Urine was lighter in color with void immediately prior to flushing. Patient denies pain. Dr. SJoni Fearsaware.

## 2021-09-20 NOTE — ED Provider Notes (Signed)
[facility] Provider Note    Event Date/Time   First MD Initiated Contact with Patient 09/20/21 [phone]     (approximate)   History   Foley problem   HPI  Juris PENNY FRISBIE is a 88 y.o. male with a history of CHF, atrial fibrillation, urinary retention with Foley catheter in place that was placed yesterday morning who comes to the ED due to poor urine drainage from his Foley catheter.  Patient also notes that he does not drink much fluids.  Denies any pain or fever.  No trauma.     Physical Exam   Triage Vital Signs: ED Triage Vitals  Enc Vitals Group     BP 09/20/21 0834 (!) 97/55     Pulse Rate 09/20/21 0834 98     Resp 09/20/21 0834 19     Temp 09/20/21 0834 98 F (36.7 C)     Temp src --      SpO2 09/20/21 0834 100 %     Weight --      Height --      Head Circumference --      Peak Flow --      Pain Score 09/20/21 0833 0     Pain Loc --      Pain Edu? --      Excl. in [place]? --     Most recent vital signs: Vitals:   09/20/21 0834 09/20/21 0928  BP: (!) 97/55 99/70  Pulse: 98 (!) 57  Resp: 19 18  Temp: 98 F (36.7 C)   SpO2: 100% 100%    General: Awake, no distress.  CV:  Good peripheral perfusion.  Resp:  Normal effort.  Abd:  No distention.  Soft and nontender Other:  Foley catheter in place at urethral meatus, balloon secured.  There is clear, dark yellow drainage in the collection bag, no sediment or purulence or cloudiness.  Leg bag is affixed to the patient's thigh, with Foley catheter coiled and kinked.   ED Results / Procedures / Treatments   Labs (all labs ordered are listed, but only abnormal results are displayed) Labs Reviewed - No data to display   RADIOLOGY    PROCEDURES:  Procedures   MEDICATIONS ORDERED IN ED: Medications - No data to display   IMPRESSION / MDM / [place] / ED COURSE  I reviewed the triage vital signs and the nursing notes.                              Patient  presents with poor drainage from his Foley catheter.  He last emptied it last night, and this morning has about 150 mL in it.  Bladder scan shows 300 in the bladder.  After repositioning the drainage bag to straighten the catheter and place it lower than the patient's body, it is draining.  Catheter was flushed easily and continues to drain into a new collection bag.  It appears limited drainages due to poor fluid intake as well as incorrect positioning of the collection bag and catheter.  No further interventions needed right now, no signs of infection, stable for discharge and continued follow-up with urology.       FINAL CLINICAL IMPRESSION(S) / ED DIAGNOSES   Final diagnoses:  Foley catheter problem, initial encounter [facility])     Rx / DC Orders   ED Discharge Orders     None  Note:  This document was prepared using Dragon voice recognition software and may include unintentional dictation errors.   Carrie Mew, MD 09/20/21 1022

## 2021-09-20 NOTE — ED Triage Notes (Signed)
Pt comes with c/o foley cath not draining. Pt had cath placed here in ED few nights ago. Pt states it was draining until yesterday it slowed down. Pt denies any pain at this time.

## 2021-09-20 NOTE — ED Notes (Signed)
Bladder scan showed 552. Dr. Joni Fears informed. Patient denies any sense of pressure. Yellow urine noted flowing into leg bag.

## 2021-09-21 ENCOUNTER — Emergency Department
Admission: EM | Admit: 2021-09-21 | Discharge: 2021-09-21 | Disposition: A | Discharge status: Home / Self Care | Payer: PPO | Source: Home / Self Care | Attending: Student in an Organized Health Care Education/Training Program | Admitting: Student in an Organized Health Care Education/Training Program

## 2021-09-21 ENCOUNTER — Other Ambulatory Visit: Payer: Self-pay

## 2021-09-21 ENCOUNTER — Encounter: Payer: Self-pay | Admitting: Emergency Medicine

## 2021-09-21 ENCOUNTER — Emergency Department
Admission: EM | Admit: 2021-09-21 | Discharge: 2021-09-21 | Disposition: A | Discharge status: Home / Self Care | Payer: PPO | Source: Home / Self Care | Attending: Emergency Medicine | Admitting: Emergency Medicine

## 2021-09-21 DIAGNOSIS — I509 Heart failure, unspecified: Secondary | ICD-10-CM | POA: Insufficient documentation

## 2021-09-21 DIAGNOSIS — Y828 Other medical devices associated with adverse incidents: Secondary | ICD-10-CM | POA: Insufficient documentation

## 2021-09-21 DIAGNOSIS — T839XXA Unspecified complication of genitourinary prosthetic device, implant and graft, initial encounter: Secondary | ICD-10-CM

## 2021-09-21 DIAGNOSIS — Y69 Unspecified misadventure during surgical and medical care: Secondary | ICD-10-CM | POA: Insufficient documentation

## 2021-09-21 DIAGNOSIS — T83098A Other mechanical complication of other indwelling urethral catheter, initial encounter: Secondary | ICD-10-CM | POA: Insufficient documentation

## 2021-09-21 DIAGNOSIS — K56609 Unspecified intestinal obstruction, unspecified as to partial versus complete obstruction: Secondary | ICD-10-CM | POA: Diagnosis not present

## 2021-09-21 DIAGNOSIS — T83091A Other mechanical complication of indwelling urethral catheter, initial encounter: Secondary | ICD-10-CM | POA: Insufficient documentation

## 2021-09-21 DIAGNOSIS — T83011A Breakdown (mechanical) of indwelling urethral catheter, initial encounter: Secondary | ICD-10-CM

## 2021-09-21 NOTE — Discharge Instructions (Signed)
Your Foley catheter was flushed and advanced and is now draining properly.  Please follow-up with urology as scheduled.

## 2021-09-21 NOTE — ED Notes (Signed)
ED Provider at bedside. 

## 2021-09-21 NOTE — ED Triage Notes (Signed)
Pt to ED via POV, states foley is not draining. Pt seen several times for same. Pt with minimal amounts of urine in leg bag. Pt states last time leg bag was emptied was while patient was in ED.

## 2021-09-21 NOTE — ED Notes (Signed)
Pt presents tonight with c/o foley not draining.  States he has been here 4-5 times over the past two weeks with difficulty voiding or foley not draining.  Also reports that he was seen by urologist this week.

## 2021-09-21 NOTE — ED Provider Notes (Signed)
[facility] Provider Note    Event Date/Time   First MD Initiated Contact with Patient 09/21/21 [id number]     (approximate)   History   Catheter not draining   HPI  Jacob Avila is a 88 y.o. male who presents the ER for concern for Foley catheter not draining.  Patient denies any pain.  Was seen here last night for similar symptoms had catheter repositioned.  States he will intermittently have some blood in the catheter.  Denies any fevers no nausea or vomiting.     Physical Exam   Triage Vital Signs: ED Triage Vitals  Enc Vitals Group     BP 09/21/21 0854 94/63     Pulse Rate 09/21/21 0854 90     Resp 09/21/21 0854 18     Temp 09/21/21 0854 97.6 F (36.4 C)     Temp Source 09/21/21 0854 Oral     SpO2 09/21/21 0854 98 %     Weight 09/21/21 0857 182 lb 15.7 oz (83 kg)     Height 09/21/21 0857 6' (1.829 m)     Head Circumference --      Peak Flow --      Pain Score 09/21/21 0857 0     Pain Loc --      Pain Edu? --      Excl. in [place]? --     Most recent vital signs: Vitals:   09/21/21 0854 09/21/21 0859  BP: 94/63 94/63  Pulse: 90 80  Resp: 18 20  Temp: 97.6 F (36.4 C) 97.6 F (36.4 C)  SpO2: 98% 99%     Constitutional: Alert  Eyes: Conjunctivae are normal.  Head: Atraumatic. Nose: No congestion/rhinnorhea. Mouth/Throat: Mucous membranes are moist.   Neck: Painless ROM.  Cardiovascular:   Good peripheral circulation. Respiratory: Normal respiratory effort.  No retractions.  Gastrointestinal: Soft and nontender.  Musculoskeletal:  no deformity Neurologic:  MAE spontaneously. No gross focal neurologic deficits are appreciated.  Skin:  Skin is warm, dry and intact. No rash noted. Psychiatric: Mood and affect are normal. Speech and behavior are normal.    ED Results / Procedures / Treatments   Labs (all labs ordered are listed, but only abnormal results are displayed) Labs Reviewed - No data to  display   EKG     RADIOLOGY EMERGENCY DEPARTMENT ULTRASOUND  Study: Limited Ultrasound of Bladder  INDICATIONS: to assess for urinary retention and/or bladder volume prior to urinary catheter Multiple views of the bladder were obtained in real-time in the transverse and longitudinal planes with a multi-frequency probe.  PERFORMED BY: Myself IMAGES ARCHIVED?: No LIMITATIONS:  none INTERPRETATION: Moderate Volume        PROCEDURES:  Critical Care performed:   Procedures   MEDICATIONS ORDERED IN ED: Medications - No data to display   IMPRESSION / MDM / [place] / ED COURSE  I reviewed the triage vital signs and the nursing notes.                              Differential diagnosis includes, but is not limited to, occluded Foley catheter, displaced catheter, bladder outlet obstruction  Patient presented to the ER for evaluation of malfunctioning Foley catheter.  He is asymptomatic well-appearing in no acute distress.  Bedside ultrasound shows that the bulb of the catheter does appear to be within large protruding prostate.  Catheter repositioned within the bladder with good  flow and return of clear nonbloody urine.  Was flushed with sterile water using sterile precautions to ensure patency.  Balloon was inflated to maintain appropriate position.  At this point does appear appropriate for outpatient follow-up.     FINAL CLINICAL IMPRESSION(S) / ED DIAGNOSES   Final diagnoses:  Foley catheter problem, initial encounter [facility])     Rx / DC Orders   ED Discharge Orders     None        Note:  This document was prepared using Dragon voice recognition software and may include unintentional dictation errors.    Merlyn Lot, MD 09/21/21 [phone]

## 2021-09-21 NOTE — Discharge Instructions (Signed)
Drink plenty of fluids.  Return for any pain, discomfort or additional concerns.

## 2021-09-21 NOTE — ED Notes (Signed)
Foley catheter flushed easily with 131m of sterile water... no return noted.  Balloon deflated by 553mand catheter advanced... blood tinged urine returned and noted at 1050 ml.  Balloon reinflated with 57m63mf sterile water and catheter reconnected to leg bag.  Pt voiced relief.

## 2021-09-21 NOTE — ED Triage Notes (Signed)
Pt reports his urinary catheter is not draining. Pt reports was discharged at 0300 this am for the same and has been seen several times for the same. Denies discomfort at this time

## 2021-09-21 NOTE — ED Notes (Signed)
Foley leg bag emptied of 367m urine.

## 2021-09-21 NOTE — ED Provider Notes (Signed)
[facility] Provider Note    Event Date/Time   First MD Initiated Contact with Patient 09/21/21 0145     (approximate)   History   Foley Problem   HPI  Jacob Avila is a 88 y.o. male medical history of chronic urinary tension with indwelling Foley presents with decreased drainage from Foley bag.  Patient was here yesterday for similar.  The Foley bag was changed at that time and since then he has not had any accumulation of urine.  He did feel some fullness in the bladder area prior to coming but this is now resolved.  Denies nausea vomiting.  Denies fevers or chills.  Denies hematuria.  Does not feel lightheaded or dizzy.     Past Medical History:  Diagnosis Date   1st degree AV block 05/13/2012   Arteriosclerosis of coronary artery 11/12/2014   Atrial fibrillation ([place]) 11/30/2016   BP (high blood pressure) 05/12/2012   Bradycardia 05/12/2012   Dizziness 05/12/2012   HLD (hyperlipidemia)     Patient Active Problem List   Diagnosis Date Noted   Syncope 06/08/2018   Atrial fibrillation ([place]) 11/30/2016   CHF (congestive heart failure) ([place]) 11/03/2016   Dyspnea on exertion 10/23/2016   Arteriosclerosis of coronary artery 11/12/2014   1st degree AV block 05/13/2012   Bradycardia 05/12/2012   Dizziness 05/12/2012   HLD (hyperlipidemia) 05/12/2012   BP (high blood pressure) 05/12/2012   History of cholecystectomy 05/12/2012     Physical Exam  Triage Vital Signs: ED Triage Vitals  Enc Vitals Group     BP 09/21/21 0124 90/70     Pulse Rate 09/21/21 0124 91     Resp 09/21/21 0124 (!) 21     Temp 09/21/21 0124 97.7 F (36.5 C)     Temp Source 09/21/21 0124 Oral     SpO2 09/21/21 0124 93 %     Weight 09/21/21 0117 185 lb (83.9 kg)     Height 09/21/21 0117 6' (1.829 m)     Head Circumference --      Peak Flow --      Pain Score 09/21/21 0116 0     Pain Loc --      Pain Edu? --      Excl. in [place]? --     Most recent vital signs: Vitals:    09/21/21 0124 09/21/21 0200  BP: 90/70 (!) 92/53  Pulse: 91 78  Resp: (!) 21 20  Temp: 97.7 F (36.5 C) 97.8 F (36.6 C)  SpO2: 93% 95%     General: Awake, no distress.  CV:  Good peripheral perfusion.  Resp:  Normal effort.  Abd:  No distention.  Abdomen soft, no palpable bladder Neuro:             Awake, Alert, Oriented x 3  Other:  Foley bag draining yellow urine   ED Results / Procedures / Treatments  Labs (all labs ordered are listed, but only abnormal results are displayed) Labs Reviewed - No data to display   EKG     RADIOLOGY    PROCEDURES:  Critical Care performed: No  .1-3 Lead EKG Interpretation  Performed by: Rada Hay, MD Authorized by: Rada Hay, MD     Interpretation: normal     ECG rate assessment: normal     Rhythm: sinus rhythm     Ectopy: none     Conduction: normal     The patient is on the cardiac  monitor to evaluate for evidence of arrhythmia and/or significant heart rate changes.   MEDICATIONS ORDERED IN ED: Medications - No data to display   IMPRESSION / MDM / [place] / ED COURSE  I reviewed the triage vital signs and the nursing notes.                              Patient's presentation is most consistent with acute, uncomplicated illness.  Differential diagnosis includes, but is not limited to, obstructed Foley, dislodged Foley, anuria  Patient is a 88 year old male with chronic urinary tension who presents with no drainage from Foley bag.  Patient had Foley placed 2 days ago urinary retention.  Seen a day a day later, yesterday because Foley was not draining.  This was fixed with simple repositioning and patient was discharged.  Foley bag was changed while he was in the ED 1 day ago and it has not had any drainage since then.  Foley catheter was flushed by ED nursing with 120 cc of sterile urine.  Balloon was deflated by 5 in all as and then the catheter was advanced.  About 1 L of urine then  returned.  Balloon was then reinflated.  There was additional 300 cc of drainage of urine after this as well.  My evaluation patient feels improved denies abdominal pain.  Denies fevers or chills.  His blood pressures on the softer side 90s over 70s here.  He denies lightheadedness dizzy denies fevers chills nausea vomiting or GI losses.  Looked back on most recent notes and patient has had similar blood pressure.  Because he is asymptomatic with similar baseline blood pressure do not feel that additional work-up is required at this time.  Patient had urine culture from 2 days ago that is negative.  Given Foley is now functioning properly he can be discharged.      FINAL CLINICAL IMPRESSION(S) / ED DIAGNOSES   Final diagnoses:  Malfunction of Foley catheter, initial encounter ([place])     Rx / DC Orders   ED Discharge Orders     None        Note:  This document was prepared using Dragon voice recognition software and may include unintentional dictation errors.   Rada Hay, MD 09/21/21 [phone]

## 2021-09-22 ENCOUNTER — Telehealth: Payer: Self-pay | Admitting: Urology

## 2021-09-22 ENCOUNTER — Other Ambulatory Visit: Payer: Self-pay | Admitting: Physician Assistant

## 2021-09-22 NOTE — Telephone Encounter (Signed)
Pt's daughter called to let us know pt spent the weekend in the ER, saying his bag wasn't draining.  She wants [place] or CMA to give her a call.

## 2021-09-22 NOTE — Progress Notes (Signed)
Surgical Physician Order [facility]  Dr. Diamantina Providence * Scheduling expectation : Next Available  *Length of Case:   *Clearance needed: yes, Cardiology (Cassandra Ramm, AGNP)  *Anticoagulation Instructions: Hold all anticoagulants  *Aspirin Instructions: N/A  *Post-op visit Date/Instructions:  1-3 day voiding trial  *Diagnosis: BPH w/BOO  *Procedure:     HOLEP (07622)   Additional orders: N/A  -Admit type: OUTpatient  -Anesthesia: General  -VTE Prophylaxis Standing Order SCD's       Other:   -Standing Lab Orders Per Anesthesia    Lab other: UA&Urine Culture  -Standing Test orders EKG/Chest x-ray per Anesthesia       Test other:   - Medications:  Ancef 2gm IV  -Other orders:  N/A

## 2021-09-22 NOTE — Telephone Encounter (Signed)
Please see all ER notes, should I add him on for an appt? Tuesday in [place]

## 2021-09-23 ENCOUNTER — Encounter: Payer: Self-pay | Admitting: Urology

## 2021-09-23 ENCOUNTER — Ambulatory Visit: Payer: PPO | Admitting: Urology

## 2021-09-23 VITALS — BP 108/66 | HR 65 | Ht 72.0 in | Wt 185.0 lb

## 2021-09-23 DIAGNOSIS — R338 Other retention of urine: Secondary | ICD-10-CM

## 2021-09-23 DIAGNOSIS — N401 Enlarged prostate with lower urinary tract symptoms: Secondary | ICD-10-CM | POA: Diagnosis not present

## 2021-09-23 DIAGNOSIS — R310 Gross hematuria: Secondary | ICD-10-CM | POA: Diagnosis not present

## 2021-09-23 DIAGNOSIS — N138 Other obstructive and reflux uropathy: Secondary | ICD-10-CM

## 2021-09-23 NOTE — Progress Notes (Signed)
09/23/2021 9:00 PM   [place] 12-26-1927 [id number]  Referring provider: Albina Billet, MD [address]   [place],  [place] [address]  Urological history: 1.  High risk hematuria -Smoker -CTU (2016) left adrenal nodule consistent with benign adenoma, bilateral Bosniak 1 renal cysts, a right hemorrhagic cyst and marked prostate hypertrophy -cysto (2016) Enlarged prostate  With trilobar coaptation and irregular contour ( left lateral lobe greater than right), ~8 cm length., friable mucosa - Elevated bladder neck - Mild trabeculation -   Retroflexion shows massive intravesical protrusion of prostate with mass effect into the bladder, ball valve appearance -Reports of gross heme  2. BPH w/ retention -prostate volume 220 grams -plan for HoLEP -Foley in place -tamsulosin 0.4 mg daily     Chief Complaint  Patient presents with   Hematuria    HPI: Jacob Avila is a 88 y.o. male who presents today for gross heme w/ his daughter, [name].  He states that he has been noting blood in his urine and has been having some urine spraying around the catheter.  He has not noted any clots in the bag.  Patient denies any modifying or aggravating factors.  Patient denies any suprapubic/flank pain.  Patient denies any fevers, chills, nausea or vomiting.    The urine in his leg bag appeared to be a Red Kool-Aid (Grade IV) hematuria.  He had a 38 French catheter that was placed by the emergency room department on 09/21/2021 after he had an inability to void with 600 mL on bladder scan.  He is currently on Omnicef.    PMH: Past Medical History:  Diagnosis Date   1st degree AV block 05/13/2012   Arteriosclerosis of coronary artery 11/12/2014   Atrial fibrillation ([place]) 11/30/2016   BP (high blood pressure) 05/12/2012   Bradycardia 05/12/2012   Dizziness 05/12/2012   HLD (hyperlipidemia)     Surgical History: Past Surgical History:  Procedure Laterality Date    CHOLECYSTECTOMY  2008   SHOULDER SURGERY  2005    Home Medications:  Allergies as of 09/23/2021   No Known Allergies      Medication List        Accurate as of September 23, 2021  9:00 PM. If you have any questions, ask your nurse or doctor.          atorvastatin 80 MG tablet Commonly known as: LIPITOR Take by mouth.   cefdinir 300 MG capsule Commonly known as: OMNICEF Take 1 capsule (300 mg total) by mouth 2 (two) times daily.   Entresto 24-26 MG Generic drug: sacubitril-valsartan Take 1 tablet by mouth 2 (two) times daily.   ferrous sulfate 325 (65 FE) MG tablet Take by mouth.   furosemide 20 MG tablet Commonly known as: LASIX Take 40 mg by mouth daily.   lisinopril 10 MG tablet Commonly known as: ZESTRIL Take 10 mg by mouth daily.   metoprolol succinate 25 MG 24 hr tablet Commonly known as: TOPROL-XL Take 1 tablet by mouth daily.   Rivaroxaban 15 MG Tabs tablet Commonly known as: XARELTO Take by mouth.   spironolactone 25 MG tablet Commonly known as: ALDACTONE Take 25 mg by mouth daily.   tamsulosin 0.4 MG Caps capsule Commonly known as: FLOMAX Take 1 capsule (0.4 mg total) by mouth daily.        Allergies: No Known Allergies  Family History: No family history on file.  Social History:  reports that he quit smoking about  43 years ago. His smoking use included cigarettes. He has been exposed to tobacco smoke. He has never used smokeless tobacco. He reports that he does not drink alcohol and does not use drugs.  ROS: Pertinent ROS in HPI  Physical Exam: BP 108/66   Pulse 65   Ht 6' (1.829 m)   Wt 185 lb (83.9 kg)   BMI 25.09 kg/m   Constitutional:  Well nourished. Alert and oriented, No acute distress. HEENT: [place] AT, moist mucus membranes.  Trachea midline Cardiovascular: No clubbing, cyanosis, or edema. Respiratory: Normal respiratory effort, no increased work of breathing. GI: Abdomen is soft, non tender, non distended, no abdominal  masses. Liver and spleen not palpable.  No hernias appreciated.  Stool sample for occult testing is not indicated.   GU: No CVA tenderness.  No bladder fullness or masses.  Patient with uncircumcised phallus. Foreskin easily retracted.  Foley in place. No penile discharge. No penile lesions or rashes. Scrotum without lesions, cysts, rashes and/or edema.  Neurologic: Grossly intact, no focal deficits, moving all 4 extremities. Psychiatric: Normal mood and affect.  Laboratory Data: WBC 3.6 - 11.2 10*9/L 10.4   RBC 4.26 - 5.60 10*12/L 5.39   HGB 12.9 - 16.5 g/dL 12.4 Low    HCT 39.0 - 48.0 % 39.2   MCV 77.6 - 95.7 fL 72.8 Low    MCH 25.9 - 32.4 pg 22.9 Low    MCHC 32.0 - 36.0 g/dL 31.5 Low    RDW 12.2 - 15.2 % 23.2 High    MPV 6.8 - 10.7 fL 9.1   Platelet 150 - 450 10*9/L 234   nRBC <=4 /100 WBCs 0   Neutrophils % % 82.8   Lymphocytes % % 7.7   Monocytes % % 8.9   Eosinophils % % 0.1   Basophils % % 0.5   Absolute Neutrophils 1.8 - 7.8 10*9/L 8.6 High    Absolute Lymphocytes 1.1 - 3.6 10*9/L 0.8 Low    Absolute Monocytes 0.3 - 0.8 10*9/L 0.9 High    Absolute Eosinophils 0.0 - 0.5 10*9/L 0.0   Absolute Basophils 0.0 - 0.1 10*9/L 0.1   Microcytosis Not Present Slight Abnormal    Anisocytosis Not Present Marked Abnormal    Resulting Agency  [facility]   Specimen Collected: 08/14/21 01:27   Performed by: [facility] Last Resulted: 08/14/21 01:39  Received From: [facility]  Result Received: 08/18/21 14:32   Sodium 135 - 145 mmol/L 141   Potassium 3.4 - 4.8 mmol/L 4.2   Chloride 98 - 107 mmol/L 105   CO2 20.0 - 31.0 mmol/L 23.2   Anion Gap 5 - 14 mmol/L 13   BUN 9 - 23 mg/dL 27 High    Creatinine 0.60 - 1.10 mg/dL 1.42 High    BUN/Creatinine Ratio  19   eGFR CKD-EPI (2021) Male >=60 mL/min/1.108m 47 Low    Comment: eGFR calculated with CKD-EPI 2021 equation in accordance with [employer] [employer] [place] [employer] Task  Force recommendations.  Glucose 70 - 179 mg/dL 146   Calcium 8.7 - 10.4 mg/dL 9.9   Albumin 3.4 - 5.0 g/dL 3.9   Total Protein 5.7 - 8.2 g/dL 7.5   Total Bilirubin 0.3 - 1.2 mg/dL 0.9   AST <=34 U/L 23   ALT 10 - 49 U/L 14   Alkaline Phosphatase 46 - 116 U/L 82   [facility]   Specimen Collected: 08/14/21 01:27  Performed by: [facility] Last Resulted: 08/14/21 01:53  Received From: [facility]  Result Received: 08/18/21 14:32   Urinalysis Component     Latest Ref Rng 09/19/2021  Color, Urine     YELLOW  YELLOW !   Appearance     CLEAR  CLOUDY !   Specific Gravity, Urine     1.005 - 1.030  1.009   pH     5.0 - 8.0  6.0   Glucose, UA     NEGATIVE mg/dL NEGATIVE   Hgb urine dipstick     NEGATIVE  LARGE !   Bilirubin Urine     NEGATIVE  NEGATIVE   Ketones, ur     NEGATIVE mg/dL NEGATIVE   Protein     NEGATIVE mg/dL 30 !   Nitrite     NEGATIVE  NEGATIVE   Leukocytes,Ua     NEGATIVE  LARGE !   RBC / HPF     0 - 5 RBC/hpf >50 (H)   WBC, UA     0 - 5 WBC/hpf >50 (H)   Bacteria, UA     NONE SEEN  FEW !   Squamous Epithelial / LPF     0 - 5  NONE SEEN   Mucus   WBC Clumps PRESENT     Legend: ! Abnormal (H) High   09/19/2021  Specimen Description URINE, RANDOM.   Special Requests NONE.   Culture <10,000 COLONIES/mL INSIGNIFICANT GROWTH. !   Report Status 09/20/2021 FINAL     Legend: ! Abnormal I have reviewed the labs.   Pertinent Imaging: N/A  Procedure Bladder Irrigation  Due to gross hematuria patient is present today for a bladder irrigation. Patient was cleaned and prepped in a sterile fashion. 1000 mL of saline/sterile water was instilled into the bladder with a 35m Toomey syringe through the catheter in place.  1000 mL of urine return was cleared from the bladder with evacuation of < 5 ccs of clot material. Efflux cleared from Kool-Aid to sterile water with the procedure and the catheter  irrigated easily. Upon completion, the catheter was draining well and was reattached to the leg bag for drainage. Patient tolerated well.   I put 30 cc back into the catheter balloon.  I attached a StatLock to his right legs in a position to allow for traction on the Foley catheter to produce a tamponade effect on his prostate when he is nonambulatory.  I advised him that when he is walking around to use the double strap to secure the catheter versus the StatLock.  Performed by: SZara Council PA-C    Assessment & Plan:    1. Gross hematuria -Likely due to massive BPH in the setting of anticoagulation therapy -Foley catheter is irrigated until clear -Demonstrated how to irrigate the catheter to the daughter in hopes to reduce ER visits -Reviewed red flag signs, but patient seems to have a low threshold to seeking care in the ED as he told me he and his daughter if any problems were found with the catheter he will just go to the ED  2. BPH w/ retention -he and his daughter would like an appointment with Dr. SDiamantina Providenceto discuss his next steps in addressing his gross hematuria, massive BPH and urinary retention  Return for appointment w/ Dr. SDiamantina Providenceto discuss next steps for gross heme/retention .  These notes generated with voice recognition software. I apologize for typographical errors.  SZara Council PA-C  [place] [address] [place], [place] [address] [phone]

## 2021-09-24 ENCOUNTER — Inpatient Hospital Stay
Admission: EM | Admit: 2021-09-24 | Discharge: 2021-09-24 | DRG: 389 | Disposition: A | Discharge status: Other Acute Inpatient Hospital | Payer: PPO | Attending: Internal Medicine | Admitting: Internal Medicine

## 2021-09-24 ENCOUNTER — Encounter: Payer: Self-pay | Admitting: Emergency Medicine

## 2021-09-24 ENCOUNTER — Other Ambulatory Visit: Payer: Self-pay

## 2021-09-24 ENCOUNTER — Emergency Department: Payer: PPO

## 2021-09-24 DIAGNOSIS — Y846 Urinary catheterization as the cause of abnormal reaction of the patient, or of later complication, without mention of misadventure at the time of the procedure: Secondary | ICD-10-CM | POA: Diagnosis present

## 2021-09-24 DIAGNOSIS — I509 Heart failure, unspecified: Secondary | ICD-10-CM

## 2021-09-24 DIAGNOSIS — I110 Hypertensive heart disease with heart failure: Secondary | ICD-10-CM | POA: Diagnosis present

## 2021-09-24 DIAGNOSIS — Y732 Prosthetic and other implants, materials and accessory gastroenterology and urology devices associated with adverse incidents: Secondary | ICD-10-CM | POA: Diagnosis present

## 2021-09-24 DIAGNOSIS — Z79899 Other long term (current) drug therapy: Secondary | ICD-10-CM

## 2021-09-24 DIAGNOSIS — Z978 Presence of other specified devices: Secondary | ICD-10-CM

## 2021-09-24 DIAGNOSIS — T83018A Breakdown (mechanical) of other indwelling urethral catheter, initial encounter: Secondary | ICD-10-CM | POA: Diagnosis present

## 2021-09-24 DIAGNOSIS — N400 Enlarged prostate without lower urinary tract symptoms: Secondary | ICD-10-CM | POA: Diagnosis present

## 2021-09-24 DIAGNOSIS — I440 Atrioventricular block, first degree: Secondary | ICD-10-CM | POA: Diagnosis present

## 2021-09-24 DIAGNOSIS — I2510 Atherosclerotic heart disease of native coronary artery without angina pectoris: Secondary | ICD-10-CM | POA: Diagnosis present

## 2021-09-24 DIAGNOSIS — R1900 Intra-abdominal and pelvic swelling, mass and lump, unspecified site: Secondary | ICD-10-CM | POA: Diagnosis present

## 2021-09-24 DIAGNOSIS — I482 Chronic atrial fibrillation, unspecified: Secondary | ICD-10-CM | POA: Diagnosis present

## 2021-09-24 DIAGNOSIS — K56609 Unspecified intestinal obstruction, unspecified as to partial versus complete obstruction: Principal | ICD-10-CM | POA: Diagnosis present

## 2021-09-24 DIAGNOSIS — Z87891 Personal history of nicotine dependence: Secondary | ICD-10-CM | POA: Diagnosis not present

## 2021-09-24 DIAGNOSIS — T839XXA Unspecified complication of genitourinary prosthetic device, implant and graft, initial encounter: Principal | ICD-10-CM

## 2021-09-24 DIAGNOSIS — I4891 Unspecified atrial fibrillation: Secondary | ICD-10-CM | POA: Diagnosis present

## 2021-09-24 DIAGNOSIS — K6389 Other specified diseases of intestine: Secondary | ICD-10-CM | POA: Diagnosis not present

## 2021-09-24 DIAGNOSIS — Z7901 Long term (current) use of anticoagulants: Secondary | ICD-10-CM

## 2021-09-24 DIAGNOSIS — E785 Hyperlipidemia, unspecified: Secondary | ICD-10-CM | POA: Diagnosis present

## 2021-09-24 DIAGNOSIS — R1030 Lower abdominal pain, unspecified: Secondary | ICD-10-CM

## 2021-09-24 LAB — COMPREHENSIVE METABOLIC PANEL
ALT: 12 U/L (ref 0–44)
AST: 22 U/L (ref 15–41)
Albumin: 3.4 g/dL — ABNORMAL LOW (ref 3.5–5.0)
Alkaline Phosphatase: 73 U/L (ref 38–126)
Anion gap: 6 (ref 5–15)
BUN: 34 mg/dL — ABNORMAL HIGH (ref 8–23)
CO2: 25 mmol/L (ref 22–32)
Calcium: 8.8 mg/dL — ABNORMAL LOW (ref 8.9–10.3)
Chloride: 108 mmol/L (ref 98–111)
Creatinine, Ser: 1.23 mg/dL (ref 0.61–1.24)
GFR, Estimated: 55 mL/min — ABNORMAL LOW (ref >60)
Glucose, Bld: 185 mg/dL — ABNORMAL HIGH (ref 70–99)
Potassium: 4 mmol/L (ref 3.5–5.1)
Sodium: 139 mmol/L (ref 135–145)
Total Bilirubin: 0.8 mg/dL (ref 0.3–1.2)
Total Protein: 7.1 g/dL (ref 6.5–8.1)

## 2021-09-24 LAB — URINALYSIS, ROUTINE W REFLEX MICROSCOPIC
Bilirubin Urine: NEGATIVE
Glucose, UA: 50 mg/dL — AB
Ketones, ur: 20 mg/dL — AB
Nitrite: NEGATIVE
Protein, ur: 100 mg/dL — AB
RBC / HPF: >50 RBC/hpf — ABNORMAL HIGH (ref 0–5)
Specific Gravity, Urine: 1.026 (ref 1.005–1.030)
WBC, UA: >50 WBC/hpf — ABNORMAL HIGH (ref 0–5)
pH: 5 (ref 5.0–8.0)

## 2021-09-24 LAB — CBC WITH DIFFERENTIAL/PLATELET
Abs Immature Granulocytes: 0.34 10*3/uL — ABNORMAL HIGH (ref 0.00–0.07)
Basophils Absolute: 0 10*3/uL (ref 0.0–0.1)
Basophils Relative: 0 %
Eosinophils Absolute: 0 10*3/uL (ref 0.0–0.5)
Eosinophils Relative: 0 %
HCT: 37.8 % — ABNORMAL LOW (ref 39.0–52.0)
Hemoglobin: 12 g/dL — ABNORMAL LOW (ref 13.0–17.0)
Immature Granulocytes: 2 %
Lymphocytes Relative: 2 %
Lymphs Abs: 0.3 10*3/uL — ABNORMAL LOW (ref 0.7–4.0)
MCH: 24.2 pg — ABNORMAL LOW (ref 26.0–34.0)
MCHC: 31.7 g/dL (ref 30.0–36.0)
MCV: 76.4 fL — ABNORMAL LOW (ref 80.0–100.0)
Monocytes Absolute: 0.5 10*3/uL (ref 0.1–1.0)
Monocytes Relative: 4 %
Neutro Abs: 13.4 10*3/uL — ABNORMAL HIGH (ref 1.7–7.7)
Neutrophils Relative %: 92 %
Platelets: 224 10*3/uL (ref 150–400)
RBC: 4.95 MIL/uL (ref 4.22–5.81)
RDW: 20.7 % — ABNORMAL HIGH (ref 11.5–15.5)
WBC: 14.7 10*3/uL — ABNORMAL HIGH (ref 4.0–10.5)
nRBC: 0 % (ref 0.0–0.2)

## 2021-09-24 LAB — PROTIME-INR
INR: 1.9 — ABNORMAL HIGH (ref 0.8–1.2)
Prothrombin Time: 22 seconds — ABNORMAL HIGH (ref 11.4–15.2)

## 2021-09-24 LAB — LIPASE, BLOOD: Lipase: 26 U/L (ref 11–51)

## 2021-09-24 LAB — LACTIC ACID, PLASMA: Lactic Acid, Venous: 1.5 mmol/L (ref 0.5–1.9)

## 2021-09-24 LAB — HEPARIN LEVEL (UNFRACTIONATED): Heparin Unfractionated: >1.1 IU/mL — ABNORMAL HIGH (ref 0.30–0.70)

## 2021-09-24 LAB — APTT: aPTT: 45 seconds — ABNORMAL HIGH (ref 24–36)

## 2021-09-24 MED ORDER — HYDROCODONE-ACETAMINOPHEN 5-325 MG PO TABS
1.0000 | ORAL_TABLET | ORAL | Status: DC | PRN
  Administered 2021-09-24: 1 via ORAL
  Filled 2021-09-24: qty 1

## 2021-09-24 MED ORDER — ALBUTEROL SULFATE (2.5 MG/3ML) 0.083% IN NEBU: 2.5000 mg | INHALATION_SOLUTION | RESPIRATORY_TRACT | Status: DC | PRN

## 2021-09-24 MED ORDER — ONDANSETRON HCL 4 MG PO TABS
4.0000 mg | ORAL_TABLET | Freq: Four times a day (QID) | ORAL | Status: DC | PRN
  Administered 2021-09-24: 4 mg via ORAL
  Filled 2021-09-24: qty 1

## 2021-09-24 MED ORDER — TRAZODONE HCL 50 MG PO TABS: 25.0000 mg | ORAL_TABLET | Freq: Every evening | ORAL | Status: DC | PRN

## 2021-09-24 MED ORDER — SODIUM CHLORIDE 0.9 % IV BOLUS
500.0000 mL | Freq: Once | INTRAVENOUS | Status: AC
  Administered 2021-09-24: 500 mL via INTRAVENOUS

## 2021-09-24 MED ORDER — ATORVASTATIN CALCIUM 20 MG PO TABS: 80.0000 mg | ORAL_TABLET | Freq: Every evening | ORAL | Status: DC

## 2021-09-24 MED ORDER — FUROSEMIDE 40 MG PO TABS: 40.0000 mg | ORAL_TABLET | Freq: Every day | ORAL | Status: DC

## 2021-09-24 MED ORDER — TAMSULOSIN HCL 0.4 MG PO CAPS: 0.4000 mg | ORAL_CAPSULE | Freq: Every evening | ORAL | Status: DC

## 2021-09-24 MED ORDER — SPIRONOLACTONE 25 MG PO TABS: 25.0000 mg | ORAL_TABLET | Freq: Every day | ORAL | Status: DC

## 2021-09-24 MED ORDER — SACUBITRIL-VALSARTAN 24-26 MG PO TABS
1.0000 | ORAL_TABLET | Freq: Two times a day (BID) | ORAL | Status: DC
  Filled 2021-09-24: qty 1

## 2021-09-24 MED ORDER — METOPROLOL SUCCINATE ER 50 MG PO TB24
25.0000 mg | ORAL_TABLET | Freq: Every day | ORAL | Status: DC
  Administered 2021-09-24: 25 mg via ORAL
  Filled 2021-09-24: qty 1

## 2021-09-24 MED ORDER — SIMETHICONE 80 MG PO CHEW
80.0000 mg | CHEWABLE_TABLET | Freq: Four times a day (QID) | ORAL | Status: DC
  Administered 2021-09-24: 80 mg via ORAL
  Filled 2021-09-24 (×4): qty 1

## 2021-09-24 MED ORDER — ACETAMINOPHEN 325 MG PO TABS: 650.0000 mg | ORAL_TABLET | Freq: Four times a day (QID) | ORAL | Status: DC | PRN

## 2021-09-24 MED ORDER — LISINOPRIL 10 MG PO TABS: 10.0000 mg | ORAL_TABLET | Freq: Every day | ORAL | Status: DC

## 2021-09-24 MED ORDER — SENNA 8.6 MG PO TABS
1.0000 | ORAL_TABLET | Freq: Two times a day (BID) | ORAL | Status: DC
  Administered 2021-09-24: 8.6 mg via ORAL
  Filled 2021-09-24: qty 1

## 2021-09-24 MED ORDER — HEPARIN (PORCINE) 25000 UT/250ML-% IV SOLN
1200.0000 [IU]/h | INTRAVENOUS | Status: DC
  Administered 2021-09-24: 1200 [IU]/h via INTRAVENOUS
  Filled 2021-09-24: qty 250

## 2021-09-24 MED ORDER — POLYETHYLENE GLYCOL 3350 17 G PO PACK
17.0000 g | PACK | Freq: Every day | ORAL | Status: DC
  Administered 2021-09-24: 17 g via ORAL
  Filled 2021-09-24: qty 1

## 2021-09-24 MED ORDER — IOHEXOL 300 MG/ML  SOLN
100.0000 mL | Freq: Once | INTRAMUSCULAR | Status: AC | PRN
  Administered 2021-09-24: 100 mL via INTRAVENOUS

## 2021-09-24 MED ORDER — ACETAMINOPHEN 325 MG RE SUPP: 650.0000 mg | Freq: Four times a day (QID) | RECTAL | Status: DC | PRN

## 2021-09-24 MED ORDER — HEPARIN BOLUS VIA INFUSION
4000.0000 [IU] | Freq: Once | INTRAVENOUS | Status: AC
  Administered 2021-09-24: 4000 [IU] via INTRAVENOUS
  Filled 2021-09-24: qty 4000

## 2021-09-24 MED ORDER — SORBITOL 70 % SOLN: 30.0000 mL | Freq: Every day | Status: DC | PRN

## 2021-09-24 MED ORDER — ONDANSETRON HCL 4 MG/2ML IJ SOLN: 4.0000 mg | Freq: Four times a day (QID) | INTRAMUSCULAR | Status: DC | PRN

## 2021-09-24 MED ORDER — FERROUS SULFATE 325 (65 FE) MG PO TABS: 325.0000 mg | ORAL_TABLET | Freq: Two times a day (BID) | ORAL | Status: DC

## 2021-09-24 MED ORDER — HYDRALAZINE HCL 20 MG/ML IJ SOLN: 10.0000 mg | INTRAMUSCULAR | Status: DC | PRN

## 2021-09-24 NOTE — Assessment & Plan Note (Addendum)
-   Patient has history of chronic atrial fibrillation. -Continue home medication.  Hold Xarelto as patient is considered for surgical intervention.  Start patient on heparin drip for general surgery. -Monitor on telemetry.

## 2021-09-24 NOTE — ED Notes (Signed)
[facility] called patient accepted ED to ED [facility] ground will transport [id number]

## 2021-09-24 NOTE — Consult Note (Signed)
Subjective:   CC: large bowel obstruction  HPI:  Jacob Avila is a 88 y.o. male who was consulted by [facility] for issue above.  Symptoms were first noted a few days ago. Pain is sharp, worsening, right side of abdomen.  Associated with nausea but no emesis, exacerbated by nothing specific, relieved specifically by passing gas.  Last BM was couple days ago prior to pain start.  Usually only has BM every couple days.  Takes iron supplement at home so stool has been black for some time.  Has never had a colonoscopy.   Past Medical History:  has a past medical history of 1st degree AV block (05/13/2012), Arteriosclerosis of coronary artery (11/12/2014), Atrial fibrillation ([place]) (11/30/2016), BP (high blood pressure) (05/12/2012), Bradycardia (05/12/2012), Dizziness (05/12/2012), and HLD (hyperlipidemia).  Past Surgical History:  Past Surgical History:  Procedure Laterality Date   CHOLECYSTECTOMY  2008   SHOULDER SURGERY  2005    Family History: family history is not on file.  Social History:  reports that he quit smoking about 43 years ago. His smoking use included cigarettes. He has been exposed to tobacco smoke. He has never used smokeless tobacco. He reports that he does not drink alcohol and does not use drugs.  Current Medications:  Prior to Admission medications   Medication Sig Start Date End Date Taking? Authorizing Provider  atorvastatin (LIPITOR) 80 MG tablet Take by mouth. 05/02/21   [provider]  cefdinir (OMNICEF) 300 MG capsule Take 1 capsule (300 mg total) by mouth 2 (two) times daily. 09/19/21   Vaillancourt, Aldona Bar, PA-C  ferrous sulfate 325 (65 FE) MG tablet Take by mouth.    [provider]  furosemide (LASIX) 20 MG tablet Take 40 mg by mouth daily.    [provider]  lisinopril (ZESTRIL) 10 MG tablet Take 10 mg by mouth daily. 05/24/18   [provider]  metoprolol succinate (TOPROL-XL) 25 MG 24 hr tablet Take 1 tablet by mouth daily.  05/08/21 05/08/22  [provider]  Rivaroxaban (XARELTO) 15 MG TABS tablet Take by mouth. 03/20/21 03/20/22  [provider]  sacubitril-valsartan (ENTRESTO) 24-26 MG Take 1 tablet by mouth 2 (two) times daily. 01/23/21   [provider]  spironolactone (ALDACTONE) 25 MG tablet Take 25 mg by mouth daily. 12/10/17 08/27/21  [provider]  tamsulosin (FLOMAX) 0.4 MG CAPS capsule Take 1 capsule (0.4 mg total) by mouth daily. 08/27/21   Billey Co, MD    Allergies:  Allergies as of 09/24/2021   (No Known Allergies)    ROS:  General: Denies weight loss, weight gain, fatigue, fevers, chills, and night sweats. Eyes: Denies blurry vision, double vision, eye pain, itchy eyes, and tearing. Ears: Denies hearing loss, earache, and ringing in ears. Nose: Denies sinus pain, congestion, infections, runny nose, and nosebleeds. Mouth/throat: Denies hoarseness, sore throat, bleeding gums, and difficulty swallowing. Heart: Denies chest pain, palpitations, racing heart, irregular heartbeat, leg pain or swelling, and decreased activity tolerance. Respiratory: Denies breathing difficulty, shortness of breath, wheezing, cough, and sputum. GI: Denies change in appetite, heartburn, vomiting, constipation, diarrhea, and blood in stool. GU: Denies difficulty urinating, pain with urinating, urgency, frequency, blood in urine. Musculoskeletal: Denies joint stiffness, pain, swelling, muscle weakness. Skin: Denies rash, itching, mass, tumors, sores, and boils Neurologic: Denies headache, fainting, dizziness, seizures, numbness, and tingling. Psychiatric: Denies depression, anxiety, difficulty sleeping, and memory loss. Endocrine: Denies heat or cold intolerance, and increased thirst or urination. Blood/lymph: Denies easy bruising, easy  bruising, and swollen glands     Objective:     BP 116/69   Pulse 77   Temp 98.3 F (36.8 C) (Oral)   Resp 19   Ht 6' (1.829 m)   Wt 83.9  kg   SpO2 100%   BMI 25.09 kg/m   Constitutional :  alert, cooperative, appears stated age, and no distress  Lymphatics/Throat:  no asymmetry, masses, or scars  Respiratory:  clear to auscultation bilaterally  Cardiovascular:  Irregular rhythm rate  Gastrointestinal: Soft, no guarding focal tenderness palpation right upper and lower quadrants.  No tenderness left side of abdomen .   Musculoskeletal: Steady movement  Skin: Cool and moist, lap chole surgical scars   Psychiatric: Normal affect, non-agitated, not confused       LABS:     Latest Ref Rng & Units 09/24/2021    7:29 AM 06/10/2018    5:48 AM 06/09/2018    5:46 AM  CMP  Glucose 70 - 99 mg/dL 185  95  104   BUN 8 - 23 mg/dL 34  28  29   Creatinine 0.61 - 1.24 mg/dL 1.23  1.06  1.17   Sodium 135 - 145 mmol/L 139  138  135   Potassium 3.5 - 5.1 mmol/L 4.0  4.7  4.7   Chloride 98 - 111 mmol/L 108  106  106   CO2 22 - 32 mmol/L '25  27  21   '$ Calcium 8.9 - 10.3 mg/dL 8.8  9.0  8.5   Total Protein 6.5 - 8.1 g/dL 7.1     Total Bilirubin 0.3 - 1.2 mg/dL 0.8     Alkaline Phos 38 - 126 U/L 73     AST 15 - 41 U/L 22     ALT 0 - 44 U/L 12         Latest Ref Rng & Units 09/24/2021    7:29 AM 06/09/2018    5:46 AM 06/08/2018    5:31 PM  CBC  WBC 4.0 - 10.5 K/uL 14.7  8.3  13.1   Hemoglobin 13.0 - 17.0 g/dL 12.0  14.2  15.1   Hematocrit 39.0 - 52.0 % 37.8  44.3  48.1   Platelets 150 - 400 K/uL 224  167  164     RADS: 8CLINICAL DATA:  Abdominal pain   EXAM: CT ABDOMEN AND PELVIS WITH CONTRAST   TECHNIQUE: Multidetector CT imaging of the abdomen and pelvis was performed using the standard protocol following bolus administration of intravenous contrast.   RADIATION DOSE REDUCTION: This exam was performed according to the departmental dose-optimization program which includes automated exposure control, adjustment of the mA and/or kV according to patient size and/or use of iterative reconstruction technique.   CONTRAST:  165m  OMNIPAQUE IOHEXOL 300 MG/ML  SOLN   COMPARISON:  CT 06/08/2018.  X-ray 09/24/2021   FINDINGS: Lower chest: Emphysematous changes within the lung bases. Coronary artery atherosclerosis.   Hepatobiliary: Multiple small hepatic cysts, unchanged. Liver otherwise within normal limits. Prior cholecystectomy.   Pancreas: Unremarkable. No pancreatic ductal dilatation or surrounding inflammatory changes.   Spleen: Normal in size without focal abnormality.   Adrenals/Urinary Tract: Stable benign left adrenal adenoma containing calcifications. Normal right adrenal gland. Bilateral renal cysts, which do not require follow-up imaging. No renal stone or hydronephrosis. Urinary bladder is decompressed by Foley catheter. The collapsed bladder wall appears diffusely thickened.   Stomach/Bowel: There is a partially obstructing circumferential intraluminal mass within the distal transverse colon measuring  approximately 2.8 x 2.6 x 2.8 cm (series 2, images 22-23). Prominent distension of the colon upstream from this lesion. There appears to be air within the wall of the ascending colon suggestive of pneumatosis (for example series 2, image 37). The colon distal to the obstructing lesion is largely decompressed. No dilated loops of small bowel.   Vascular/Lymphatic: Aortic atherosclerosis. No enlarged abdominal or pelvic lymph nodes.   Reproductive: Markedly enlarged prostate gland.   Other: Small volume free fluid within the right pericolic gutter measuring slightly greater than simple fluid density. No pneumoperitoneum.   Musculoskeletal: No acute or significant osseous findings.   IMPRESSION: 1. Large bowel obstruction secondary to an intraluminal mass within the distal transverse colon measuring up to 2.8 cm, highly suspicious for primary colon malignancy. Pneumatosis within the wall of the ascending colon is concerning for bowel ischemia. Urgent surgical consultation is  recommended. 2. Small volume free fluid within the right pericolic gutter measuring slightly greater than simple fluid density. 3. Markedly enlarged prostate gland. 4. Urinary bladder is decompressed by Foley catheter. The collapsed bladder wall appears diffusely thickened, likely secondary to chronic outlet obstruction. 5. Aortic Atherosclerosis (ICD10-I70.0).   These results were called by telephone at the time of interpretation on 09/24/2021 at 9:32 am to provider Dr Nickolas Madrid, who verbally acknowledged these results.     Electronically Signed   By: Davina Poke D.O.   On: 09/24/2021 09:35.: Assessment:   Abdominal pain secondary to large bowel obstruction from likely colon cancer based on imaging.  Chronic Foley placement for BPH  A-fib on Eliquis  CHF  Plan:   He does not have acute abdomen, but very focal tenderness on right side. Not to the point of surgery yet, so recommend admission with serial abdominal exams.  heparin drip in the meantime.  NPO, NG if he starts to get nauseated.  After discussion with daughter, will see if tertiary care center can see for possible stent placement, since they do not want to proceed right away with resection due to comorbidities.  CEA ordered, also discussed with hospitalist who will proceed with medical optimziation/risk stratification, in preparation for possible surgery.  Chronic Foley placement for BPH.  Defer to hospitalist for further care.  The patient verbalized understanding and all questions were answered to the patient's satisfaction.  labs/images/medications/previous chart entries reviewed personally and relevant changes/updates noted above.

## 2021-09-24 NOTE — ED Notes (Signed)
Images powershared to [facility]

## 2021-09-24 NOTE — ED Notes (Signed)
EMTALA reviewed by this RN and all required documents are up to date. Pt is ready for transfer.  

## 2021-09-24 NOTE — Assessment & Plan Note (Signed)
-   Continue home meds °

## 2021-09-24 NOTE — ED Provider Notes (Signed)
[facility] Provider Note    Event Date/Time   First MD Initiated Contact with Patient 09/24/21 [phone]     (approximate)   History   Catheter Leaking and Constipation   HPI  Jacob Avila is a 88 y.o. male who presents to the ED from home with a chief complaint of leaking Foley catheter and constipation with lower abdominal pain.  Patient has a Foley catheter in place for BPH.  He has had multiple ED visits for Foley catheter problems.  Just saw urology in the clinic yesterday with Foley irrigation for hematuria.  Arrives to the ED with nonbloody urine in leg bag and the knee of his pants wet with urine.  States he has not had a BM in 2 days; usually he has 1 daily.  Denies fever, chills, cough, chest pain, shortness of breath, nausea, vomiting.     Past Medical History   Past Medical History:  Diagnosis Date   1st degree AV block 05/13/2012   Arteriosclerosis of coronary artery 11/12/2014   Atrial fibrillation ([place]) 11/30/2016   BP (high blood pressure) 05/12/2012   Bradycardia 05/12/2012   Dizziness 05/12/2012   HLD (hyperlipidemia)      Active Problem List   Patient Active Problem List   Diagnosis Date Noted   Syncope 06/08/2018   Atrial fibrillation ([place]) 11/30/2016   CHF (congestive heart failure) ([place]) 11/03/2016   Dyspnea on exertion 10/23/2016   Arteriosclerosis of coronary artery 11/12/2014   1st degree AV block 05/13/2012   Bradycardia 05/12/2012   Dizziness 05/12/2012   HLD (hyperlipidemia) 05/12/2012   BP (high blood pressure) 05/12/2012   History of cholecystectomy 05/12/2012     Past Surgical History   Past Surgical History:  Procedure Laterality Date   CHOLECYSTECTOMY  2008   SHOULDER SURGERY  2005     Home Medications   Prior to Admission medications   Medication Sig Start Date End Date Taking? Authorizing Provider  atorvastatin (LIPITOR) 80 MG tablet Take by mouth. 05/02/21   [provider]  cefdinir  (OMNICEF) 300 MG capsule Take 1 capsule (300 mg total) by mouth 2 (two) times daily. 09/19/21   Vaillancourt, Aldona Bar, PA-C  ferrous sulfate 325 (65 FE) MG tablet Take by mouth.    [provider]  furosemide (LASIX) 20 MG tablet Take 40 mg by mouth daily.    [provider]  lisinopril (ZESTRIL) 10 MG tablet Take 10 mg by mouth daily. 05/24/18   [provider]  metoprolol succinate (TOPROL-XL) 25 MG 24 hr tablet Take 1 tablet by mouth daily. 05/08/21 05/08/22  [provider]  Rivaroxaban (XARELTO) 15 MG TABS tablet Take by mouth. 03/20/21 03/20/22  [provider]  sacubitril-valsartan (ENTRESTO) 24-26 MG Take 1 tablet by mouth 2 (two) times daily. 01/23/21   [provider]  spironolactone (ALDACTONE) 25 MG tablet Take 25 mg by mouth daily. 12/10/17 08/27/21  [provider]  tamsulosin (FLOMAX) 0.4 MG CAPS capsule Take 1 capsule (0.4 mg total) by mouth daily. 08/27/21   Billey Co, MD     Allergies  Patient has no known allergies.   Family History  No family history on file.   Physical Exam  Triage Vital Signs: ED Triage Vitals  Enc Vitals Group     BP 09/24/21 0459 91/70     Pulse --      Resp 09/24/21 0459 18     Temp --      Temp  src --      SpO2 --      Weight 09/24/21 0500 185 lb (83.9 kg)     Height 09/24/21 0500 6' (1.829 m)     Head Circumference --      Peak Flow --      Pain Score 09/24/21 0507 8     Pain Loc --      Pain Edu? --      Excl. in [place]? --     Updated Vital Signs: BP 91/70 (BP Location: Left Arm)   Pulse 70   Temp 98.2 F (36.8 C) (Rectal)   Resp 18   Ht 6' (1.829 m)   Wt 83.9 kg   SpO2 100%   BMI 25.09 kg/m    General: Awake, no distress.  CV:  RRR.  Good peripheral perfusion.  Resp:  Normal effort.  CTA B. Abd:  Nontender to light or deep palpation.  No distention.  Other:  No leakage from Foley catheter insertion site.  No gross blood noted in leg bag.   ED Results /  Procedures / Treatments  Labs (all labs ordered are listed, but only abnormal results are displayed) Labs Reviewed  URINE CULTURE  CBC WITH DIFFERENTIAL/PLATELET  COMPREHENSIVE METABOLIC PANEL  LIPASE, BLOOD  URINALYSIS, ROUTINE W REFLEX MICROSCOPIC     EKG  None   RADIOLOGY I have independently visualized and interpreted patient's x-ray as well as noted the radiology interpretation:  KUB: Gaseous distention, questionable soft tissue density likely fluid/stool, consider CT scan  Official radiology report(s): DG Abdomen 1 View  Result Date: 09/24/2021 CLINICAL DATA:  Constipation with low abdominal pain. EXAM: ABDOMEN - 1 VIEW COMPARISON:  05/22/2018 FINDINGS: Gaseous distention of small bowel and colon noted in the abdomen with a relative paucity of gas distended bowel in the pelvis. Overall imaging features are similar to prior study. Soft tissue density in the right lower abdomen is likely fluid/stool in the right colon. Diffuse degenerative changes are noted in the lumbar spine. Catheter tubing overlies the midline pelvis. IMPRESSION: Gaseous distention of small bowel and colon with a relative paucity of gas distended bowel in the pelvis. Soft tissue density in the right abdomen is likely fluid/stool in the colon with soft tissue mass considered less likely. Abdominopelvic CT could be used to further evaluate as clinically warranted. Electronically Signed   By: Misty Stanley M.D.   On: 09/24/2021 05:47     PROCEDURES:  Critical Care performed: No  Procedures   MEDICATIONS ORDERED IN ED: Medications  sodium chloride 0.9 % bolus 500 mL (has no administration in time range)     IMPRESSION / MDM / ASSESSMENT AND PLAN / ED COURSE  I reviewed the triage vital signs and the nursing notes.                             88 year old male presenting with leaking Foley catheter bag and constipation.  Differential diagnosis includes but is not limited to malfunctioning leg bag,  malpositioning of Foley catheter, ileus, SBO, etc.  I personally reviewed patient's records and note his urology office visit from yesterday as well as multiple ED visits this month.  Patient's presentation is most consistent with acute, uncomplicated illness.  We will obtain KUB and investigate source of Foley catheter leak.  Clinical Course as of 09/24/21 0659  Wed Sep 24, 2021  0624 X-rays demonstrate gaseous distention, recommend CT scan.  It has  been a month since patient's last set of lab work as well.  We will obtain basic lab work and CT abdomen/pelvis. [JS]  [id number] Care transferred to Dr. Jari Pigg at change of shift pending labs and CT scan. [JS]    Clinical Course User Index [JS] Paulette Blanch, MD     FINAL CLINICAL IMPRESSION(S) / ED DIAGNOSES   Final diagnoses:  Foley catheter problem, initial encounter ([place])  Lower abdominal pain     Rx / DC Orders   ED Discharge Orders     None        Note:  This document was prepared using Dragon voice recognition software and may include unintentional dictation errors.   Paulette Blanch, MD 09/24/21 [phone]

## 2021-09-24 NOTE — Assessment & Plan Note (Addendum)
-   Patient presents with abdominal pain and constipation. - CT imaging revealed large bowel obstruction secondary to intraluminal mass within the distal transverse colon measuring up to 2.8 cm.  - General surgeon has evaluated the patient and discussed with patient and his daughter on treatment options including surgery versus stent placement.  Patient and the family has opted to proceed with a stent placement.  General surgeon has discussed with physician at [facility] and patient is being transferred to [facility] for further management.  -Deferred to general surgeon on further course of action.

## 2021-09-24 NOTE — ED Provider Notes (Signed)
7:18 AM Assumed care for off going team.   Blood pressure 91/70, pulse 70, temperature 98.2 F (36.8 C), temperature source Rectal, resp. rate 18, height 6' (1.829 m), weight 83.9 kg, SpO2 100 %.  See their HPI for full report but in brief pending labs and CT imaging.   I reviewed some of patient's prior blood pressures he typically runs 90s over 70s  Discussed with Dr Lysle Pearl- recommend admit to the hospitalist.  I asked him if he would want any antibiotics or blood thinners and he is going to first see the patient before making recommendation.  Urine looked infected but he has catheter most likely colonization.   Discussed with the hospital team for admission.     Vanessa [place], MD 09/24/21 1050

## 2021-09-24 NOTE — Assessment & Plan Note (Signed)
-   Not in CHF exacerbation. -Follows with cardiologist at [place] home medication Entresto and spironolactone.

## 2021-09-24 NOTE — Consult Note (Signed)
Initial Consultation Note   Patient: Jacob Avila YIF:[id number] DOB: [birth year] PCP: Jacob Billet, Avila DOA: 09/24/2021 DOS: the patient was seen and examined on 09/24/2021 Primary service: [facility]ila,*  Referring physician: Dr. Lysle Avila, General surgeon Reason for consult:  Medical management   Assessment and Plan: * Colonic mass - Patient presents with abdominal pain and constipation. - CT imaging revealed large bowel obstruction secondary to intraluminal mass within the distal transverse colon measuring up to 2.8 cm.  - General surgeon has evaluated the patient and discussed with patient and his daughter on treatment options including surgery versus stent placement.  Patient and the family has opted to proceed with a stent placement.  General surgeon has discussed with physician at [facility] and patient is being transferred to [facility] for further management.  -Deferred to general surgeon on further course of action.  Chronic indwelling Foley catheter - Patient has history of BPH and has chronic indwelling Foley catheter. -Visited his urologist yesterday and underwent irrigation for hematuria. -Today patient noted to have some leak from his Foley catheter which has been adjusted in the ED. -Continue to monitor.  Atrial fibrillation ([facility]) - Patient has history of chronic atrial fibrillation. -Continue home medication.  Hold Xarelto as patient is considered for surgical intervention.  Start patient on heparin drip for general surgery. -Monitor on telemetry.  CHF (congestive heart failure) ([place]) - Not in CHF exacerbation. -Follows with cardiologist at [facility] home medication Entresto and spironolactone.  HLD (hyperlipidemia) - Continue home meds.       TRH will sign off at present, please call us again when needed.  HPI: Jacob Avila is a 88 y.o. male with past medical history of BPH with chronic indwelling Foley catheter and follows with  urologist, congestive heart failure and chronic atrial fibrillation currently follows with cardiologist at [facility], essential hypertension and hyperlipidemia now presents to emergency department with abdominal pain and leak from his Foley catheter.  Patient states that he visited his urologist yesterday and underwent irrigation of his Foley catheter as he was noted to have hematuria.  Today his Foley catheter started leaking and patient also started noticing abdominal pain.  He had no BM for the past 4 to 5 days and feels like he is constipated.  Denies any nausea or vomiting, fever or chills, chest pain or cough.  In the emergency department patient's initial vitals were stable except for soft blood pressure of 91/70.  Blood work significant for mild leukocytosis with WBC count 14,700 and anemia with hemoglobin of 12 g.  Blood glucose 185.  Rest of the labs are remarkable.  UA showed WBC greater than 50, moderate leukocyte esterase and rare bacteria.  Urine culture being processed.  CT abdomen and pelvis with contrast showed large bowel obstruction secondary to an intraluminal mass within the distal transverse colon measuring up to 2.8 cm.  Highly suspicious for primary colon malignancy.  Pneumatosis within the wall of the ascending colon is concerning for bowel ischemia.  Based on this finding, ED provider has consulted general surgeon who has evaluated the patient and had discussion with patient's daughter.  Family wants to proceed with further intervention including surgery versus stent placement.  General surgeon has requested hospitalist service to manage patient's medical issue and to start patient on heparin drip while he makes further plan.      Review of Systems: As mentioned in the history of present illness. All other systems reviewed and are negative. Past Medical  History:  Diagnosis Date   1st degree AV block 05/13/2012   Arteriosclerosis of coronary artery 11/12/2014   Atrial fibrillation ([place])  11/30/2016   BP (high blood pressure) 05/12/2012   Bradycardia 05/12/2012   Dizziness 05/12/2012   HLD (hyperlipidemia)    Past Surgical History:  Procedure Laterality Date   CHOLECYSTECTOMY  2008   SHOULDER SURGERY  2005   Social History:  reports that he quit smoking about 43 years ago. His smoking use included cigarettes. He has been exposed to tobacco smoke. He has never used smokeless tobacco. He reports that he does not drink alcohol and does not use drugs.  No Known Allergies  No family history on file.  Prior to Admission medications   Medication Sig Start Date End Date Taking? Authorizing Provider  cefdinir (OMNICEF) 300 MG capsule Take 1 capsule (300 mg total) by mouth 2 (two) times daily. 09/19/21  Yes Vaillancourt, Samantha, PA-C  metoprolol succinate (TOPROL-XL) 25 MG 24 hr tablet Take 1 tablet by mouth daily. 05/08/21 05/08/22 Yes Jacob Avila  Rivaroxaban (XARELTO) 15 MG TABS tablet Take 15 mg by mouth in the morning. 03/20/21 03/20/22 Yes Jacob Avila  sacubitril-valsartan (ENTRESTO) 24-26 MG Take 1 tablet by mouth 2 (two) times daily. 01/23/21  Yes Jacob Avila  spironolactone (ALDACTONE) 25 MG tablet Take 25 mg by mouth daily. 12/10/17 09/24/21 Yes Jacob Avila  tamsulosin (FLOMAX) 0.4 MG CAPS capsule Take 1 capsule (0.4 mg total) by mouth daily. 08/27/21  Yes Jacob Avila  atorvastatin (LIPITOR) 80 MG tablet Take by mouth. 05/02/21   Jacob Avila  ferrous sulfate 325 (65 FE) MG tablet Take by mouth. Patient not taking: Reported on 09/24/2021    Jacob Avila  furosemide (LASIX) 20 MG tablet Take 40 mg by mouth daily. Patient not taking: Reported on 09/24/2021    Jacob Avila  lisinopril (ZESTRIL) 10 MG tablet Take 10 mg by mouth daily. Patient not taking: Reported on 09/24/2021 05/24/18   Jacob Avila    Physical Exam: Vitals:   09/24/21 1500 09/24/21 1530 09/24/21 1600 09/24/21  1615  BP: 117/67 116/74 116/73   Pulse: 63 68 (!) 41 (!) 58  Resp:  17    Temp:  98.4 F (36.9 C)    TempSrc:  Oral    SpO2: 95% 100% 100% 99%  Weight:      Height:       GENERAL:  patient lying in the bed with no acute distress.  EYES: Pupils equal, round, reactive to light and accommodation. No scleral icterus. Extraocular muscles intact.  HEENT: Head atraumatic, normocephalic. Oropharynx and nasopharynx clear.  NECK:  Supple, no jugular venous distention. No thyroid enlargement, no tenderness.  LUNGS: Normal breath sounds bilaterally, no wheezing, rales,rhonchi or crepitation. No use of accessory muscles of respiration.  CARDIOVASCULAR: Regular rate and rhythm, S1, S2 normal. No murmurs, rubs, or gallops.  ABDOMEN: Soft, mildly distended and doughy.  Tenderness noted diffusely with more prominent in the epigastric and umbilical region.  No guarding, rigidity or rebound tenderness.    EXTREMITIES: No pedal edema, cyanosis, or clubbing.  NEUROLOGIC: Cranial nerves II through XII are intact. Muscle strength 5/5 in all extremities. Sensation intact. Gait not checked.  PSYCHIATRIC: The patient is alert and oriented x 3.  Normal affect and good eye contact. SKIN: No obvious rash, lesion, or ulcer.    Data Reviewed:   There are no new results to review at this time.  Family Communication: Discussed  with patient's daughter at the bedside. Primary team communication:  Thank you very much for involving [place] in the care of your patient.  Author: Caryn Bee, Avila 09/24/2021 4:39 PM  For on call review [email or web address].

## 2021-09-24 NOTE — ED Triage Notes (Signed)
Pt reports that his catheter is leaking and he is having constipation with lower abd pain

## 2021-09-24 NOTE — ED Provider Notes (Signed)
At time of signout plan had been for hospitalist admission.  However Dr. Lysle Pearl with surgery who had been consulted on this patient felt patient required higher level of care.  He discussed with surgeons at [facility].  Given acuity of patient's condition and need for higher level of care the admission was canceled.  Patient had remained in the emergency department.  Will be transferred ED to ED.   Nance Pear, MD 09/24/21 [phone]

## 2021-09-24 NOTE — Consult Note (Signed)
[place] for Heparin Indication: atrial fibrillation  No Known Allergies  Patient Measurements: Height: 6' (182.9 cm) Weight: 83.9 kg (185 lb) IBW/kg (Calculated) : 77.6 Heparin Dosing Weight: 83.9 kg  Vital Signs: Temp: 98.5 F (36.9 C) (09/20 1208) Temp Source: Oral (09/20 0800) BP: 116/86 (09/20 1400) Pulse Rate: 93 (09/20 1345)  Labs: Recent Labs    09/24/21 0729  HGB 12.0*  HCT 37.8*  PLT 224  CREATININE 1.23    Estimated Creatinine Clearance: 42.9 mL/min (by C-G formula based on SCr of 1.23 mg/dL).   Medical History: Past Medical History:  Diagnosis Date   1st degree AV block 05/13/2012   Arteriosclerosis of coronary artery 11/12/2014   Atrial fibrillation ([place]) 11/30/2016   BP (high blood pressure) 05/12/2012   Bradycardia 05/12/2012   Dizziness 05/12/2012   HLD (hyperlipidemia)     Medications:  PTA: Rivaroxaban '15mg'$  daily, last dose taken 9/19@~0900(per daughter)  Assessment: Pharmacy has been consulted to initiate and dose heparin infusion in 88yo patient with history of arteriosclerosis of coronary artery and atrial fibrillation presenting to the ED with chief complaint of constipation with lower abdominal pain. CT showing large bowel obstruction secondary to an intraluminal mass within the distal transverse colon measuring up to 2.8 cm, highly suspicious for primary colon malignancy. Pneumatosis within the wall of the ascending colon is concerning for bowel ischemia. Surgery team has been consulted.  Baseline labs have been ordered and are pending.   Goal of Therapy:  Heparin level 0.3-0.7 units/ml aPTT: 66-102 sec Monitor platelets by anticoagulation protocol: Yes   Plan:  Give 4000 units bolus x 1 Start heparin infusion at 1200 units/hr Check anti-Xa level in 8 hours and daily while on heparin Continue to monitor H&H and platelets  Greysen Devino A Terrilyn Tyner 09/24/2021,2:29 PM

## 2021-09-24 NOTE — Assessment & Plan Note (Signed)
-   Patient has history of BPH and has chronic indwelling Foley catheter. -Visited his urologist yesterday and underwent irrigation for hematuria. -Today patient noted to have some leak from his Foley catheter which has been adjusted in the ED. -Continue to monitor.

## 2021-09-25 LAB — URINE CULTURE: Culture: NO GROWTH

## 2021-09-26 LAB — CEA: CEA: 3 ng/mL (ref 0.0–4.7)

## 2021-09-26 NOTE — Progress Notes (Signed)
Patient with significant bowel surgery on 09/24/2021, currently intubated at [facility]. Will have patient follow up in a few weeks to discuss possibility of surgery. Removing orders from Surgery Coordinator Inbasket, will re-add if patient deemed suitable for urological surgery.

## 2021-09-30 ENCOUNTER — Ambulatory Visit: Payer: PPO | Admitting: Urology

## 2021-10-02 ENCOUNTER — Ambulatory Visit: Payer: Medicare Other | Admitting: Family Medicine

## 2021-11-04 ENCOUNTER — Ambulatory Visit: Payer: PPO | Admitting: Urology

## 2021-11-04 ENCOUNTER — Encounter: Payer: Self-pay | Admitting: Urology

## 2021-11-04 VITALS — BP 110/64 | HR 97 | Ht 72.0 in | Wt 196.0 lb

## 2021-11-04 DIAGNOSIS — N138 Other obstructive and reflux uropathy: Secondary | ICD-10-CM

## 2021-11-04 DIAGNOSIS — N401 Enlarged prostate with lower urinary tract symptoms: Secondary | ICD-10-CM

## 2021-11-04 DIAGNOSIS — R338 Other retention of urine: Secondary | ICD-10-CM

## 2021-11-04 LAB — BLADDER SCAN AMB NON-IMAGING

## 2021-11-04 NOTE — Progress Notes (Signed)
   11/04/2021 1:40 PM   Jacob Avila Sep 02, 1927 [id number]  Reason for visit: Follow up urinary retention  HPI: 88 year old frail male who originally presented to the [facility] ER with urinary retention on 08/14/2021.  CT at that time showed a 220 g prostate, stable left adrenal nodule, and distended bladder.  He is anticoagulated at baseline with Xarelto.  He has failed 2 voiding trials since that time, and also had multiple ER visits for gross hematuria and problems with catheter not draining.  At our last visit we had discussed consideration of HOLEP to resume spontaneous voiding and resolve his recurrent gross hematuria felt to be from BPH/catheter irritation.  He was seen in the ER on 09/24/2021 and found to have an intraluminal colonic mass causing a bowel obstruction and pneumatosis was seen.  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  Apparently at the end of that hospitalization his catheter was removed prior to discharge, about 1 week ago, and he has been having incontinence since that time. Bladder scan today 58m indicative of overflow incontinence.  He is very bothered by wearing the depends that are constantly wet and would like the Foley to be replaced.  Foley catheter was replaced today, see procedure note.  We discussed options for bladder management again today at length including Foley catheter replacement with monthly changes, intermittent catheterization, HOLEP, or consideration of prostatic artery embolization(PAE).  Risks and benefits were discussed at [place] have [place] much higher risk of postoperative incontinence at least temporarily, but lower risk of recurrent gross hematuria or retention.  PAE has lower upfront risk of intervention, however may still require catheter for a few weeks to months, or still required Foley catheter if unable to void, however there is  minimal to no risk of incontinence.  While he recovers from his bowel resection and hospitalization, he would like to have the Foley catheter replaced and plan to change that out on a monthly basis moving forward, and consider HOLEP or PAE in the future.  We had an extensive conversation about the risks and benefits, and I think he and his daughter have a good understanding about options moving forward for bladder management.  -Foley replaced today -Referral to IR to at least discuss PAE -RTC 1 month Foley change, re-discuss HOLEP  I spent 45 total minutes on the day of the encounter including pre-visit review of the medical record, face-to-face time with the patient, and post visit ordering of labs/imaging/tests.   BBilley Co [facility] [address] [place] [phone]

## 2021-11-06 ENCOUNTER — Other Ambulatory Visit: Payer: Self-pay | Admitting: Urology

## 2021-11-06 DIAGNOSIS — R338 Other retention of urine: Secondary | ICD-10-CM

## 2021-11-12 ENCOUNTER — Encounter: Payer: Self-pay | Admitting: Internal Medicine

## 2021-11-12 ENCOUNTER — Emergency Department: Payer: PPO

## 2021-11-12 ENCOUNTER — Other Ambulatory Visit: Payer: Self-pay

## 2021-11-12 ENCOUNTER — Inpatient Hospital Stay
Admission: EM | Admit: 2021-11-12 | Discharge: 2021-11-20 | DRG: 665 | Disposition: A | Discharge status: Skilled Nursing Facility | Payer: PPO | Attending: Internal Medicine | Admitting: Internal Medicine

## 2021-11-12 DIAGNOSIS — T83021A Displacement of indwelling urethral catheter, initial encounter: Principal | ICD-10-CM | POA: Diagnosis present

## 2021-11-12 DIAGNOSIS — I130 Hypertensive heart and chronic kidney disease with heart failure and stage 1 through stage 4 chronic kidney disease, or unspecified chronic kidney disease: Secondary | ICD-10-CM | POA: Diagnosis present

## 2021-11-12 DIAGNOSIS — Z7901 Long term (current) use of anticoagulants: Secondary | ICD-10-CM

## 2021-11-12 DIAGNOSIS — Z79899 Other long term (current) drug therapy: Secondary | ICD-10-CM

## 2021-11-12 DIAGNOSIS — Z87891 Personal history of nicotine dependence: Secondary | ICD-10-CM | POA: Diagnosis not present

## 2021-11-12 DIAGNOSIS — I482 Chronic atrial fibrillation, unspecified: Secondary | ICD-10-CM | POA: Diagnosis present

## 2021-11-12 DIAGNOSIS — N1831 Chronic kidney disease, stage 3a: Secondary | ICD-10-CM | POA: Diagnosis present

## 2021-11-12 DIAGNOSIS — I10 Essential (primary) hypertension: Secondary | ICD-10-CM | POA: Diagnosis not present

## 2021-11-12 DIAGNOSIS — I700 Atherosclerosis of aorta: Secondary | ICD-10-CM | POA: Diagnosis present

## 2021-11-12 DIAGNOSIS — R319 Hematuria, unspecified: Secondary | ICD-10-CM | POA: Diagnosis not present

## 2021-11-12 DIAGNOSIS — N401 Enlarged prostate with lower urinary tract symptoms: Secondary | ICD-10-CM | POA: Diagnosis present

## 2021-11-12 DIAGNOSIS — I5022 Chronic systolic (congestive) heart failure: Secondary | ICD-10-CM | POA: Diagnosis present

## 2021-11-12 DIAGNOSIS — D62 Acute posthemorrhagic anemia: Secondary | ICD-10-CM | POA: Diagnosis present

## 2021-11-12 DIAGNOSIS — G9341 Metabolic encephalopathy: Secondary | ICD-10-CM | POA: Diagnosis present

## 2021-11-12 DIAGNOSIS — Y738 Miscellaneous gastroenterology and urology devices associated with adverse incidents, not elsewhere classified: Secondary | ICD-10-CM | POA: Diagnosis present

## 2021-11-12 DIAGNOSIS — I2510 Atherosclerotic heart disease of native coronary artery without angina pectoris: Secondary | ICD-10-CM | POA: Diagnosis present

## 2021-11-12 DIAGNOSIS — F32A Depression, unspecified: Secondary | ICD-10-CM | POA: Diagnosis present

## 2021-11-12 DIAGNOSIS — F0390 Unspecified dementia without behavioral disturbance: Secondary | ICD-10-CM | POA: Diagnosis present

## 2021-11-12 DIAGNOSIS — E785 Hyperlipidemia, unspecified: Secondary | ICD-10-CM | POA: Diagnosis present

## 2021-11-12 DIAGNOSIS — R569 Unspecified convulsions: Secondary | ICD-10-CM

## 2021-11-12 DIAGNOSIS — N400 Enlarged prostate without lower urinary tract symptoms: Secondary | ICD-10-CM | POA: Diagnosis present

## 2021-11-12 DIAGNOSIS — R310 Gross hematuria: Secondary | ICD-10-CM | POA: Diagnosis present

## 2021-11-12 DIAGNOSIS — R339 Retention of urine, unspecified: Secondary | ICD-10-CM | POA: Diagnosis present

## 2021-11-12 DIAGNOSIS — Z85038 Personal history of other malignant neoplasm of large intestine: Secondary | ICD-10-CM | POA: Diagnosis not present

## 2021-11-12 DIAGNOSIS — N179 Acute kidney failure, unspecified: Secondary | ICD-10-CM | POA: Diagnosis not present

## 2021-11-12 DIAGNOSIS — R338 Other retention of urine: Secondary | ICD-10-CM | POA: Diagnosis present

## 2021-11-12 DIAGNOSIS — R3129 Other microscopic hematuria: Secondary | ICD-10-CM | POA: Diagnosis not present

## 2021-11-12 DIAGNOSIS — Z978 Presence of other specified devices: Secondary | ICD-10-CM | POA: Diagnosis not present

## 2021-11-12 DIAGNOSIS — Z66 Do not resuscitate: Secondary | ICD-10-CM | POA: Diagnosis present

## 2021-11-12 DIAGNOSIS — N3289 Other specified disorders of bladder: Secondary | ICD-10-CM | POA: Diagnosis present

## 2021-11-12 DIAGNOSIS — Z85048 Personal history of other malignant neoplasm of rectum, rectosigmoid junction, and anus: Secondary | ICD-10-CM | POA: Diagnosis not present

## 2021-11-12 LAB — LIPASE, BLOOD: Lipase: 42 U/L (ref 11–51)

## 2021-11-12 LAB — COMPREHENSIVE METABOLIC PANEL
ALT: 17 U/L (ref 0–44)
AST: 19 U/L (ref 15–41)
Albumin: 2.9 g/dL — ABNORMAL LOW (ref 3.5–5.0)
Alkaline Phosphatase: 81 U/L (ref 38–126)
Anion gap: 10 (ref 5–15)
BUN: 26 mg/dL — ABNORMAL HIGH (ref 8–23)
CO2: 17 mmol/L — ABNORMAL LOW (ref 22–32)
Calcium: 8.7 mg/dL — ABNORMAL LOW (ref 8.9–10.3)
Chloride: 108 mmol/L (ref 98–111)
Creatinine, Ser: 1.51 mg/dL — ABNORMAL HIGH (ref 0.61–1.24)
GFR, Estimated: 43 mL/min — ABNORMAL LOW (ref >60)
Glucose, Bld: 125 mg/dL — ABNORMAL HIGH (ref 70–99)
Potassium: 4.3 mmol/L (ref 3.5–5.1)
Sodium: 135 mmol/L (ref 135–145)
Total Bilirubin: 0.5 mg/dL (ref 0.3–1.2)
Total Protein: 7 g/dL (ref 6.5–8.1)

## 2021-11-12 LAB — URINALYSIS, ROUTINE W REFLEX MICROSCOPIC
Bacteria, UA: NONE SEEN
Bilirubin Urine: NEGATIVE
Glucose, UA: NEGATIVE mg/dL
Ketones, ur: NEGATIVE mg/dL
Nitrite: NEGATIVE
Protein, ur: NEGATIVE mg/dL
RBC / HPF: >50 RBC/hpf — ABNORMAL HIGH (ref 0–5)
Specific Gravity, Urine: 1.009 (ref 1.005–1.030)
Squamous Epithelial / HPF: NONE SEEN (ref 0–5)
pH: 5 (ref 5.0–8.0)

## 2021-11-12 LAB — CBC WITH DIFFERENTIAL/PLATELET
Abs Immature Granulocytes: 0.07 10*3/uL (ref 0.00–0.07)
Basophils Absolute: 0.1 10*3/uL (ref 0.0–0.1)
Basophils Relative: 1 %
Eosinophils Absolute: 0.1 10*3/uL (ref 0.0–0.5)
Eosinophils Relative: 1 %
HCT: 33.3 % — ABNORMAL LOW (ref 39.0–52.0)
Hemoglobin: 10.2 g/dL — ABNORMAL LOW (ref 13.0–17.0)
Immature Granulocytes: 1 %
Lymphocytes Relative: 16 %
Lymphs Abs: 1.5 10*3/uL (ref 0.7–4.0)
MCH: 23.1 pg — ABNORMAL LOW (ref 26.0–34.0)
MCHC: 30.6 g/dL (ref 30.0–36.0)
MCV: 75.3 fL — ABNORMAL LOW (ref 80.0–100.0)
Monocytes Absolute: 0.5 10*3/uL (ref 0.1–1.0)
Monocytes Relative: 6 %
Neutro Abs: 7.3 10*3/uL (ref 1.7–7.7)
Neutrophils Relative %: 75 %
Platelets: 205 10*3/uL (ref 150–400)
RBC: 4.42 MIL/uL (ref 4.22–5.81)
RDW: 16.8 % — ABNORMAL HIGH (ref 11.5–15.5)
WBC: 9.6 10*3/uL (ref 4.0–10.5)
nRBC: 0 % (ref 0.0–0.2)

## 2021-11-12 LAB — CBC
HCT: 30.8 % — ABNORMAL LOW (ref 39.0–52.0)
Hemoglobin: 9.4 g/dL — ABNORMAL LOW (ref 13.0–17.0)
MCH: 23.3 pg — ABNORMAL LOW (ref 26.0–34.0)
MCHC: 30.5 g/dL (ref 30.0–36.0)
MCV: 76.2 fL — ABNORMAL LOW (ref 80.0–100.0)
Platelets: 231 10*3/uL (ref 150–400)
RBC: 4.04 MIL/uL — ABNORMAL LOW (ref 4.22–5.81)
RDW: 16.8 % — ABNORMAL HIGH (ref 11.5–15.5)
WBC: 15.5 10*3/uL — ABNORMAL HIGH (ref 4.0–10.5)
nRBC: 0 % (ref 0.0–0.2)

## 2021-11-12 LAB — PROTIME-INR
INR: 1.6 — ABNORMAL HIGH (ref 0.8–1.2)
Prothrombin Time: 19.2 seconds — ABNORMAL HIGH (ref 11.4–15.2)

## 2021-11-12 LAB — TROPONIN I (HIGH SENSITIVITY): Troponin I (High Sensitivity): 6 ng/L (ref <18)

## 2021-11-12 LAB — CBG MONITORING, ED: Glucose-Capillary: 154 mg/dL — ABNORMAL HIGH (ref 70–99)

## 2021-11-12 LAB — BRAIN NATRIURETIC PEPTIDE: B Natriuretic Peptide: 101.9 pg/mL — ABNORMAL HIGH (ref 0.0–100.0)

## 2021-11-12 LAB — GLUCOSE, CAPILLARY: Glucose-Capillary: 111 mg/dL — ABNORMAL HIGH (ref 70–99)

## 2021-11-12 LAB — MRSA NEXT GEN BY PCR, NASAL: MRSA by PCR Next Gen: NOT DETECTED

## 2021-11-12 LAB — APTT: aPTT: 39 seconds — ABNORMAL HIGH (ref 24–36)

## 2021-11-12 MED ORDER — SODIUM CHLORIDE 0.9 % IV BOLUS
1000.0000 mL | Freq: Once | INTRAVENOUS | Status: AC
  Administered 2021-11-12: 1000 mL via INTRAVENOUS

## 2021-11-12 MED ORDER — SODIUM CHLORIDE 0.9 % IV SOLN
2.0000 g | Freq: Two times a day (BID) | INTRAVENOUS | Status: DC
  Administered 2021-11-12: 2 g via INTRAVENOUS
  Filled 2021-11-12: qty 12.5

## 2021-11-12 MED ORDER — LEVETIRACETAM IN NACL 1500 MG/100ML IV SOLN
1500.0000 mg | Freq: Once | INTRAVENOUS | Status: AC
  Administered 2021-11-12: 1500 mg via INTRAVENOUS
  Filled 2021-11-12: qty 100

## 2021-11-12 MED ORDER — FENTANYL CITRATE PF 50 MCG/ML IJ SOSY
50.0000 ug | PREFILLED_SYRINGE | Freq: Once | INTRAMUSCULAR | Status: AC
  Administered 2021-11-12: 50 ug via INTRAVENOUS
  Filled 2021-11-12: qty 1

## 2021-11-12 MED ORDER — ONDANSETRON HCL 4 MG/2ML IJ SOLN: 4.0000 mg | Freq: Three times a day (TID) | INTRAMUSCULAR | Status: DC | PRN

## 2021-11-12 MED ORDER — ACETAMINOPHEN 325 MG PO TABS
650.0000 mg | ORAL_TABLET | Freq: Four times a day (QID) | ORAL | Status: DC | PRN
  Administered 2021-11-15: 650 mg via ORAL
  Filled 2021-11-12: qty 2

## 2021-11-12 MED ORDER — SODIUM CHLORIDE 0.9 % IV SOLN
1.0000 g | INTRAVENOUS | Status: AC
  Administered 2021-11-13 – 2021-11-15 (×3): 1 g via INTRAVENOUS
  Filled 2021-11-12 (×3): qty 10

## 2021-11-12 MED ORDER — SODIUM CHLORIDE 0.9 % IV BOLUS (SEPSIS): 500.0000 mL | Freq: Once | INTRAVENOUS | Status: DC

## 2021-11-12 MED ORDER — MIDAZOLAM HCL 2 MG/2ML IJ SOLN: 2.0000 mg | Freq: Once | INTRAMUSCULAR | Status: AC

## 2021-11-12 MED ORDER — MIDAZOLAM HCL 2 MG/2ML IJ SOLN
INTRAMUSCULAR | Status: AC
  Administered 2021-11-12: 2 mg via INTRAVENOUS
  Filled 2021-11-12: qty 2

## 2021-11-12 MED ORDER — METOPROLOL TARTRATE 5 MG/5ML IV SOLN: 2.5000 mg | INTRAVENOUS | Status: DC | PRN

## 2021-11-12 MED ORDER — ONDANSETRON HCL 4 MG/2ML IJ SOLN
INTRAMUSCULAR | Status: AC
  Filled 2021-11-12: qty 2

## 2021-11-12 MED ORDER — CHLORHEXIDINE GLUCONATE CLOTH 2 % EX PADS
6.0000 | MEDICATED_PAD | Freq: Every day | CUTANEOUS | Status: DC
  Administered 2021-11-12 – 2021-11-20 (×7): 6 via TOPICAL

## 2021-11-12 MED ORDER — IOHEXOL 300 MG/ML  SOLN
75.0000 mL | Freq: Once | INTRAMUSCULAR | Status: AC | PRN
  Administered 2021-11-12: 75 mL via INTRAVENOUS

## 2021-11-12 MED ORDER — MIDAZOLAM HCL 2 MG/2ML IJ SOLN: 2.0000 mg | Freq: Once | INTRAMUSCULAR | Status: DC

## 2021-11-12 MED ORDER — LORAZEPAM 2 MG/ML IJ SOLN: 1.0000 mg | INTRAMUSCULAR | Status: DC | PRN

## 2021-11-12 MED ORDER — ONDANSETRON HCL 4 MG/2ML IJ SOLN
4.0000 mg | Freq: Once | INTRAMUSCULAR | Status: AC
  Administered 2021-11-12: 4 mg via INTRAVENOUS
  Filled 2021-11-12: qty 2

## 2021-11-12 MED ORDER — ONDANSETRON HCL 4 MG/2ML IJ SOLN
4.0000 mg | Freq: Once | INTRAMUSCULAR | Status: AC
  Administered 2021-11-12: 4 mg via INTRAVENOUS

## 2021-11-12 MED ORDER — ACETAMINOPHEN 650 MG RE SUPP: 650.0000 mg | Freq: Four times a day (QID) | RECTAL | Status: DC | PRN

## 2021-11-12 MED ORDER — SODIUM CHLORIDE 0.9 % IR SOLN
3000.0000 mL | Status: DC
  Administered 2021-11-12: 3000 mL

## 2021-11-12 MED ORDER — LEVETIRACETAM IN NACL 500 MG/100ML IV SOLN
500.0000 mg | Freq: Two times a day (BID) | INTRAVENOUS | Status: DC
  Administered 2021-11-13 – 2021-11-16 (×8): 500 mg via INTRAVENOUS
  Filled 2021-11-12 (×9): qty 100

## 2021-11-12 MED ORDER — SODIUM CHLORIDE 0.9 % IR SOLN
3000.0000 mL | Status: DC
  Administered 2021-11-12 – 2021-11-13 (×3): 3000 mL

## 2021-11-12 NOTE — ED Notes (Signed)
Unable to irrigate foley, called to have urology cart brought to ER. Pt given pain medication and taken to CT

## 2021-11-12 NOTE — ED Triage Notes (Signed)
Per EMS, [facility] said pt had more blood in his foley than normal. Pt daughter said pt had seizure like activity which was witnessed by EMS. Not postictal after 30 seconds of shaking. Pt does have blood in foley bag

## 2021-11-12 NOTE — H&P (View-Only) (Signed)
Urology Consult   I have been asked to see the patient by Dr. Jori Moll, for evaluation and management of BPH, gross hematuria, clot retention.  Chief Complaint: Gross hematuria and clot retention  HPI:  Jacob Avila is a 88 y.o. male well-known to myself and urology.  Briefly, originally presented to the [facility] ER with urinary retention on 08/14/2021 and CT showed a 220 g prostate with distended bladder.  He is anticoagulated at baseline with Eliquis.  He failed multiple voiding trials since that time, and also had multiple ER visits for gross hematuria/clot retention.  They had ultimately opted for HOLEP with me to address both his retention and recurrent gross hematuria, however he was seen in the ER on 09/24/2021 and found to have an obstructing intraluminal colonic mass causing bowel obstruction and was transferred to [facility] for bowel resection showing colon adenocarcinoma and a diverting ostomy.  His catheter was apparently removed prior to discharge from [place] at that time and he then saw me in clinic a week later on 11/04/2021 with overflow incontinence and elevated PVR 500 mL and opted for Foley catheter replacement.  He presents this morning with nondraining Foley catheter with lower abdominal pain, palpable bladder, and significant clots.  CT performed this morning showed significant clot burden in the bladder and Foley catheter within the prostatic urethra.  The ER changed his Foley for a 51 [place] three-way and started CBI without significant irrigation.  Urology was consulted shortly after when he had a distended bladder and nondraining Foley.  He is here with his daughter and son today who provides most of the history.  There is also a question of possible seizure this morning on presentation to EMS.  Last dose of Eliquis was yesterday morning reportedly.  He denies any chest pain or shortness of breath.  Reports lower abdominal pain in the bladder region.  PMH: Past Medical History:   Diagnosis Date   1st degree AV block 05/13/2012   Arteriosclerosis of coronary artery 11/12/2014   Atrial fibrillation ([place]) 11/30/2016   BP (high blood pressure) 05/12/2012   Bradycardia 05/12/2012   Dizziness 05/12/2012   HLD (hyperlipidemia)     Surgical History: Past Surgical History:  Procedure Laterality Date   CHOLECYSTECTOMY  2008   SHOULDER SURGERY  2005     Allergies: No Known Allergies  Family History: No family history on file.  Social History:  reports that he quit smoking about 43 years ago. His smoking use included cigarettes. He has been exposed to tobacco smoke. He has never used smokeless tobacco. He reports that he does not drink alcohol and does not use drugs.   Physical Exam: BP 96/73 (BP Location: Left Arm)   Pulse 88   Temp 98.3 F (36.8 C) (Oral)   Resp 18   Ht 6' (1.829 m)   Wt 80.5 kg   SpO2 98%   BMI 24.06 kg/m    Constitutional: Appears uncomfortable, sedated Cardiovascular: No clubbing, cyanosis, or edema. Respiratory: Normal respiratory effort, no increased work of breathing. GI: Abdomen is soft, nontender, nondistended, no abdominal masses.  Ostomy in place with stool GU: 22 French three-way Foley with maroon urine  Procedure: In standard sterile fashion the three-way Foley catheter was irrigated with at least 5 L of normal saline with return of at least 300 mL old clot.  I was ultimately able to irrigate the catheter to very faint pink, but high suspicion for some residual clot based on the CT  findings.  Bladder scan 19m after irrigation.  Laboratory Data: Reviewed in epic Hematocrit 33.3 from 37.8 149-monthgo Creatinine 1.5 from 1.23 baseline  Pertinent Imaging: I have personally reviewed the CT abdomen pelvis showing large prostate with Foley balloon within the prostatic urethra, significant clot burden within the bladder.  Assessment & Plan:   Comorbid [age]ear old male with BPH(220 g prostate) and urinary retention who has failed  multiple voiding trials and had multiple hospitalizations and ER visits for gross hematuria and nondraining Foley catheter.  He also has [facility] cancer [facility]  They actually have an appointment tomorrow with interventional radiology to discuss prostatic artery embolization.  Catheter was irrigated for 45 minutes at the bedside today with return of significant clot burden and clear to faint pink.  High suspicion for some residual clot though based on CT findings.  Patient also had some questionable seizure activity on admission, as well as after irrigation.  Bladder scan after irrigation was 0 mL.  He was started on slow to medium CBI and urine was very faint pink.  I had a long conversation with the patient's family today about options.  He has proven with multiple ER visits that he has not tolerated Foley catheter well on anticoagulation with recurrent episodes of obstruction/clot retention.  Options would include chronic Foley catheter with high risk for recurrent hospitalizations/ER visits, proceed with HOLEP(holmium laser enucleation of the prostate) as previously considered for both retention and recurrent gross hematuria, or prostatic artery embolization with lower risk of incontinence, but likely longer duration until symptom improvement, recurrent gross hematuria, and potential persistent need for Foley catheter despite procedure.  Their original IR appointment was scheduled for tomorrow with Dr. WaPascal LuxI reached out to Dr. McLaurence Ferrariho is [facility] to discuss possible timing options during this hospitalization.  I will also reach out to the operating room to see what opportunities we have for HOLEP this week if needed.  Recommendations:  -Agree with admission to hospitalist and further work-up of reported seizure activity -Repeat H/H, transfuse as needed -Hold all anticoagulation -Continue CBI, urology will continue  to closely follow -Anticipate procedure this hospitalization for his BPH and gross hematuria, either HOLEP(potentially Thursday 11/9 in the afternoon) or prostatic artery embolization  BrBilley CoMD  Total time spent on the floor was 90 minutes, with greater than 50% spent in counseling and coordination of care with the patient regarding BPH, retention, and recurrent gross hematuria, irrigation of Foley catheter, and treatment options of chronic Foley, HOLEP, or prostatic artery embolization with IR.  BuWauwatosa[address]302 Be[address] Ridge StreetSuDesert View HighlandsuGrass Valley[place] [phone]

## 2021-11-12 NOTE — Consult Note (Signed)
Pharmacy Antibiotic Note  Jacob Avila is a 88 y.o. male admitted on 11/12/2021 with UTI.  Pharmacy has been consulted for cefepime dosing.  Assessment: 88 y.o. M presenting with nondraining Foley catheter with lower abdominal pain, palpable bladder, and significant clots.   Plan: Cefepime 2gm IV Q12H  Height: 6' (182.9 cm) Weight: 80.5 kg (177 lb 6.4 oz) IBW/kg (Calculated) : 77.6  Temp (24hrs), Avg:98.1 F (36.7 C), Min:97.8 F (36.6 C), Max:98.3 F (36.8 C)  Recent Labs  Lab 11/12/21 0550  WBC 9.6  CREATININE 1.51*    Estimated Creatinine Clearance: 35 mL/min (A) (by C-G formula based on SCr of 1.51 mg/dL (H)).    No Known Allergies  Antimicrobials this admission: Cefepime 11/8 >>   Dose adjustments this admission: N/A  Microbiology results: 11/8 BCx: pending 11/8 UCx: pending    Thank you for allowing pharmacy to be a part of this patient's care.  Alison Murray 11/12/2021 2:19 PM

## 2021-11-12 NOTE — Consult Note (Addendum)
Urology Consult   I have been asked to see the patient by Dr. Jori Moll, for evaluation and management of BPH, gross hematuria, clot retention.  Chief Complaint: Gross hematuria and clot retention  HPI:  Jacob Avila is a 88 y.o. male well-known to myself and urology.  Briefly, originally presented to the [facility] ER with urinary retention on 08/14/2021 and CT showed a 220 g prostate with distended bladder.  He is anticoagulated at baseline with Eliquis.  He failed multiple voiding trials since that time, and also had multiple ER visits for gross hematuria/clot retention.  They had ultimately opted for HOLEP with me to address both his retention and recurrent gross hematuria, however he was seen in the ER on 09/24/2021 and found to have an obstructing intraluminal colonic mass causing bowel obstruction and was transferred to [facility] for bowel resection showing colon adenocarcinoma and a diverting ostomy.  His catheter was apparently removed prior to discharge from [facility] at that time and he then saw me in clinic a week later on 11/04/2021 with overflow incontinence and elevated PVR 500 mL and opted for Foley catheter replacement.  He presents this morning with nondraining Foley catheter with lower abdominal pain, palpable bladder, and significant clots.  CT performed this morning showed significant clot burden in the bladder and Foley catheter within the prostatic urethra.  The ER changed his Foley for a 88 [place] three-way and started CBI without significant irrigation.  Urology was consulted shortly after when he had a distended bladder and nondraining Foley.  He is here with his daughter and son today who provides most of the history.  There is also a question of possible seizure this morning on presentation to EMS.  Last dose of Eliquis was yesterday morning reportedly.  He denies any chest pain or shortness of breath.  Reports lower abdominal pain in the bladder region.  PMH: Past Medical History:   Diagnosis Date   1st degree AV block 05/13/2012   Arteriosclerosis of coronary artery 11/12/2014   Atrial fibrillation ([place]) 11/30/2016   BP (high blood pressure) 05/12/2012   Bradycardia 05/12/2012   Dizziness 05/12/2012   HLD (hyperlipidemia)     Surgical History: Past Surgical History:  Procedure Laterality Date   CHOLECYSTECTOMY  2008   SHOULDER SURGERY  2005     Allergies: No Known Allergies  Family History: No family history on file.  Social History:  reports that he quit smoking about 43 years ago. His smoking use included cigarettes. He has been exposed to tobacco smoke. He has never used smokeless tobacco. He reports that he does not drink alcohol and does not use drugs.   Physical Exam: BP 96/73 (BP Location: Left Arm)   Pulse 88   Temp 98.3 F (36.8 C) (Oral)   Resp 18   Ht 6' (1.829 m)   Wt 80.5 kg   SpO2 98%   BMI 24.06 kg/m    Constitutional: Appears uncomfortable, sedated Cardiovascular: No clubbing, cyanosis, or edema. Respiratory: Normal respiratory effort, no increased work of breathing. GI: Abdomen is soft, nontender, nondistended, no abdominal masses.  Ostomy in place with stool GU: 22 French three-way Foley with maroon urine  Procedure: In standard sterile fashion the three-way Foley catheter was irrigated with at least 5 L of normal saline with return of at least 300 mL old clot.  I was ultimately able to irrigate the catheter to very faint pink, but high suspicion for some residual clot based on the CT  findings.  Bladder scan 14m after irrigation.  Laboratory Data: Reviewed in epic Hematocrit 33.3 from 37.8 184-monthgo Creatinine 1.5 from 1.23 baseline  Pertinent Imaging: I have personally reviewed the CT abdomen pelvis showing large prostate with Foley balloon within the prostatic urethra, significant clot burden within the bladder.  Assessment & Plan:   Comorbid [age]ear old male with BPH(220 g prostate) and urinary retention who has failed  multiple voiding trials and had multiple hospitalizations and ER visits for gross hematuria and nondraining Foley catheter.  He also has colon cancer and [facility]  They actually have an appointment tomorrow with interventional radiology to discuss prostatic artery embolization.  Catheter was irrigated for 45 minutes at the bedside today with return of significant clot burden and clear to faint pink.  High suspicion for some residual clot though based on CT findings.  Patient also had some questionable seizure activity on admission, as well as after irrigation.  Bladder scan after irrigation was 0 mL.  He was started on slow to medium CBI and urine was very faint pink.  I had a long conversation with the patient's family today about options.  He has proven with multiple ER visits that he has not tolerated Foley catheter well on anticoagulation with recurrent episodes of obstruction/clot retention.  Options would include chronic Foley catheter with high risk for recurrent hospitalizations/ER visits, proceed with HOLEP(holmium laser enucleation of the prostate) as previously considered for both retention and recurrent gross hematuria, or prostatic artery embolization with lower risk of incontinence, but likely longer duration until symptom improvement, recurrent gross hematuria, and potential persistent need for Foley catheter despite procedure.  Their original IR appointment was scheduled for tomorrow with Dr. WaPascal LuxI reached out to Dr. McLaurence Ferrariho [facility] to discuss possible timing options during this hospitalization.  I will also reach out to the operating room to see what opportunities we have for HOLEP this week if needed.  Recommendations:  -Agree with admission to hospitalist and further work-up of reported seizure activity -Repeat H/H, transfuse as needed -Hold all anticoagulation -Continue CBI, urology will continue  to closely follow -Anticipate procedure this hospitalization for his BPH and gross hematuria, either HOLEP(potentially Thursday 11/9 in the afternoon) or prostatic artery embolization  BrBilley CoMD  Total time spent on the floor was 90 minutes, with greater than 50% spent in counseling and coordination of care with the patient regarding BPH, retention, and recurrent gross hematuria, irrigation of Foley catheter, and treatment options of chronic Foley, HOLEP, or prostatic artery embolization with IR.  [address] StreetSuKenmoreuWhitmore LakeNC [phone]

## 2021-11-12 NOTE — Progress Notes (Signed)
Eeg done 

## 2021-11-12 NOTE — ED Provider Notes (Addendum)
Care assumed from Dr. Leonides Schanz.  Patient has a history of atrial fibrillation is on Eliquis.  History of BPH with chronic indwelling Foley catheter.  Urinary retention and hematuria since yesterday at nursing facility.  Patient had a witnessed seizure-like activity that was described as tonic-clonic and lasted for about 30 seconds.  This was with EMS and reported from his daughter who is at bedside when this occurred.  No history of a seizure disorder.   Lower abdominal tenderness to palpation on my exam with palpable bladder.  Foley catheter was removed with a large clot.  Patient able to urinate on his own.  Placed a 28 French three-way Foley catheter for irrigation but continued to have multiple blood clots causing urinary retention and the catheter.  Patient initiated on CBI.  Labs with a hemoglobin of 10.2.  Baseline 09/2021 was 12.  Consulted urology and discussed with Dr. Diamantina Providence, who started CBI at bedside.  Recommended admission to the hospitalist and will reach out to IR to discuss embolization.   Called into the patient's room for witnessed seizure-like activity.  Patient had generalized shaking and was unresponsive, tachycardic during that time.  Resolved after approximately 20 seconds.  Good peripheral pulses.  Given IV Versed 2 mg.  Ordered Keppra 1500 mg.  Consulted and discussed the patient's case with neurology Dr. Quinn Axe who recommended adding on another 1500 mg to have a total of 3 g of Keppra.  CT scan of the head without signs of intracranial hemorrhage or infarction.  On reevaluation patient able to follow simple commands.  CRITICAL CARE Performed by: Nathaniel Man   Total critical care time: 45 minutes  Critical care time was exclusive of separately billable procedures and treating other patients.  Critical care was necessary to treat or prevent imminent or life-threatening deterioration.  Critical care was time spent personally by me on the following activities: development  of treatment plan with patient and/or surrogate as well as nursing, discussions with consultants, evaluation of patient's response to treatment, examination of patient, obtaining history from patient or surrogate, ordering and performing treatments and interventions, ordering and review of laboratory studies, ordering and review of radiographic studies, pulse oximetry and re-evaluation of patient's condition.   Consulted hospitalist for admission for hematuria.  12:20 pm -patient was evaluated by that Dr. Quinn Axe in the emergency department.  Following simple commands but unable to state his name or where he sat is not returned back to baseline.  Unable to admit given concern for possible nonconvulsive seizures.  Given the patient's urologist plans to do procedure with possible IR do feel that the patient would be best served at this hospital.  We will continue to monitor.  Waiting for spot EEG to evaluate for nonconvulsive seizures.  Updated the hospitalist on holding of admission.  2:30 PM -we will place EEG patient became very combative.  Yelling and cursing at staff.  States that he is going to kill people, unable to verbally de-escalate.  Patient was placed in soft wrist restraints for violent behavior and trying to pull out his Foley catheter.  Will reevaluate after EEG.      Nathaniel Man, MD 11/12/21 1113    Nathaniel Man, MD 11/12/21 1242    Nathaniel Man, MD 11/12/21 1505

## 2021-11-12 NOTE — Progress Notes (Signed)
Urine remains pink on slow to medium CBI.  I irrigated with an additional 2 L with return of at least 75 mL old clot, urine faint pink at conclusion of irrigation, and resumed slow CBI with faint pink output  Continue CBI overnight  I had another long conversation with the patient and family this afternoon.  After discussing with interventional radiology, they would not be able to perform PAE within the next few days secondary to some equipment changes/construction at [facility].   Overall, I think he has better served with HOLEP regardless that would address both his urinary retention and recurrent gross hematuria of prostate source.  With PAE would likely require Foley catheter at least another 4 weeks with high risk for recurrent ER visits, gross hematuria, and persistent retention requiring long-term Foley  We discussed the risks and benefits of HoLEP at length.  The procedure requires general anesthesia and takes 1 to 2 hours, and a holmium laser is used to enucleate the prostate and push this tissue into the bladder.  A morcellator is then used to remove this tissue, which is sent for pathology.  Typically patients would discharge the same day, however with his comorbidities and numerous hospitalizations we will plan to monitor at least overnight.  We specifically discussed the risks of bleeding, infection, retrograde ejaculation, temporary urgency and urge incontinence, very low risk of long-term incontinence, urethral stricture/bladder neck contracture, pathologic evaluation of prostate tissue and possible detection of prostate cancer or other malignancy, and possible need for additional procedures.  -NPO at midnight -Hold all anticoagulation(last dose of Eliquis 11/7 AM) -Plan HOLEP(holmium laser enucleation of the prostate) tomorrow afternoon ~2pm  Jacob Madrid, MD 11/12/2021

## 2021-11-12 NOTE — ED Provider Notes (Signed)
[facility] Provider Note    Event Date/Time   First MD Initiated Contact with Patient 11/12/21 [phone]     (approximate)   History   Hematuria   HPI  Jacob Avila is a 88 y.o. male with history of A-fib on Eliquis, hyperlipidemia, hypertension, CHF, BPH who presents to the emergency department with EMS for gross hematuria noted in his Foley catheter.  Patient is living at [facility].  He states that he has had this Foley catheter for about 3 months and it was last changed out a week ago.  He states he is having some left lower quadrant abdominal pain.  No fevers, chest pain or shortness of breath.  He is having nausea here.  Patient's daughter states that she thought she witnessed seizure-like activity when EMS arrived at the nursing facility.  She describes it as about 30 seconds of shaking all over and that his dentures came out of his mouth.  There was no incontinence.  She states his eyes were closed during this episode.  She states that he did not respond, talk initially but then started talking normally about 4 to 5 minutes later.  She denies any previous history of seizures.  He denies any headache, numbness, tingling or weakness.   History provided by patient, daughter, EMS.    Past Medical History:  Diagnosis Date   1st degree AV block 05/13/2012   Arteriosclerosis of coronary artery 11/12/2014   Atrial fibrillation ([place]) 11/30/2016   BP (high blood pressure) 05/12/2012   Bradycardia 05/12/2012   Dizziness 05/12/2012   HLD (hyperlipidemia)     Past Surgical History:  Procedure Laterality Date   CHOLECYSTECTOMY  2008   SHOULDER SURGERY  2005    MEDICATIONS:  Prior to Admission medications   Medication Sig Start Date End Date Taking? Authorizing Provider  amiodarone (PACERONE) 200 MG tablet Take by mouth. 10/29/21 10/29/22  [provider]  apixaban (ELIQUIS) 5 MG TABS tablet Take by mouth. 10/28/21   [provider]   atorvastatin (LIPITOR) 80 MG tablet Take by mouth. 05/02/21   [provider]  finasteride (PROSCAR) 5 MG tablet Take by mouth. 10/29/21 10/29/22  [provider]  magnesium oxide (MAG-OX) 400 MG tablet Take by mouth. 10/28/21 10/28/22  [provider]  Melatonin 3 MG TBDP Take by mouth. 10/28/21   [provider]  mirtazapine (REMERON) 7.5 MG tablet Take by mouth. 10/28/21 10/28/22  [provider]  nitroGLYCERIN (NITROSTAT) 0.4 MG SL tablet Place 1 tablet under the tongue every 5 (five) minutes as needed. 10/28/21 10/28/22  [provider]  senna-docusate (SENOKOT-S) 8.6-50 MG tablet Take 2 tablets by mouth at bedtime. 10/28/21   [provider]  tamsulosin (FLOMAX) 0.4 MG CAPS capsule Take by mouth. 10/28/21 10/28/22  [provider]    Physical Exam   Triage Vital Signs: ED Triage Vitals  Enc Vitals Group     BP 11/12/21 0535 97/70     Pulse Rate 11/12/21 0535 88     Resp 11/12/21 0535 19     Temp --      Temp src --      SpO2 11/12/21 0535 96 %     Weight 11/12/21 0538 177 lb 6.4 oz (80.5 kg)     Height 11/12/21 0538 6' (1.829 m)     Head Circumference --      Peak Flow --      Pain Score 11/12/21 0538 6  Pain Loc --      Pain Edu? --      Excl. in [place]? --     Most recent vital signs: Vitals:   11/12/21 0600 11/12/21 0630  BP: 103/74 103/72  Pulse:    Resp: (!) 21 (!) 22  Temp:    SpO2:      CONSTITUTIONAL: Alert and oriented x 3 and responds appropriately to questions.  Elderly, thin HEAD: Normocephalic, atraumatic EYES: Conjunctivae clear, pupils appear equal, sclera nonicteric ENT: normal nose; moist mucous membranes NECK: Supple, normal ROM CARD: Irregularly irregular and rate controlled; S1 and S2 appreciated; no murmurs, no clicks, no rubs, no gallops RESP: Normal chest excursion without splinting or tachypnea; breath sounds clear and equal bilaterally; no wheezes, no rhonchi, no  rales, no hypoxia or respiratory distress, speaking full sentences ABD/GI: Normal bowel sounds; non-distended; soft, ender to palpation in the left lower quadrant without guarding or rebound, indwelling Foley catheter in place with gross hematuria, ostomy on the right side of the abdomen with normal brown appearing stool without melena or gross blood BACK: The back appears normal EXT: Normal ROM in all joints; no deformity noted, no edema; no cyanosis SKIN: Normal color for age and race; warm; no rash on exposed skin NEURO: Moves all extremities equally, normal speech, no facial asymmetry PSYCH: The patient's mood and manner are appropriate.   ED Results / Procedures / Treatments   LABS: (all labs ordered are listed, but only abnormal results are displayed) Labs Reviewed  CBC WITH DIFFERENTIAL/PLATELET - Abnormal; Notable for the following components:      Result Value   Hemoglobin 10.2 (*)    HCT 33.3 (*)    MCV 75.3 (*)    MCH 23.1 (*)    RDW 16.8 (*)    All other components within normal limits  COMPREHENSIVE METABOLIC PANEL - Abnormal; Notable for the following components:   CO2 17 (*)    Glucose, Bld 125 (*)    BUN 26 (*)    Creatinine, Ser 1.51 (*)    Calcium 8.7 (*)    Albumin 2.9 (*)    GFR, Estimated 43 (*)    All other components within normal limits  PROTIME-INR - Abnormal; Notable for the following components:   Prothrombin Time 19.2 (*)    INR 1.6 (*)    All other components within normal limits  URINE CULTURE  LIPASE, BLOOD  URINALYSIS, ROUTINE W REFLEX MICROSCOPIC  TROPONIN I (HIGH SENSITIVITY)  TROPONIN I (HIGH SENSITIVITY)     EKG:  EKG Interpretation  Date/Time:  Wednesday November 12 2021 05:35:56 EST Ventricular Rate:  87 PR Interval:    QRS Duration: 104 QT Interval:  394 QTC Calculation: 474 R Axis:   -23 Text Interpretation: Atrial fibrillation Borderline left axis deviation Anteroseptal infarct, age indeterminate Confirmed by Pryor Curia  [phone]) on 11/12/2021 6:17:09 AM         RADIOLOGY: My personal review and interpretation of imaging: CTs pending.  I have personally reviewed all radiology reports.   No results found.   PROCEDURES:  Critical Care performed: No    .1-3 Lead EKG Interpretation  Performed by: Joely Losier, Delice Bison, DO Authorized by: Gill Delrossi, Delice Bison, DO     Interpretation: abnormal     ECG rate:  88   ECG rate assessment: normal     Rhythm: atrial fibrillation     Ectopy: none     Conduction: normal       IMPRESSION /  MDM / ASSESSMENT AND PLAN / ED COURSE  I reviewed the triage vital signs and the nursing notes.    Patient here with gross hematuria from his Foley catheter, complaints of lower abdominal pain and possible seizure-like activity with his daughter and EMS.  The patient is on the cardiac monitor to evaluate for evidence of arrhythmia and/or significant heart rate changes.   DIFFERENTIAL DIAGNOSIS (includes but not limited to):   UTI, gross hematuria from Foley catheter and being on Eliquis, urinary retention from hematuria, diverticulitis, colitis, constipation, bowel obstruction  Seizure, CVA, intracranial hemorrhage, mass, electrolyte derangement, UTI   Patient's presentation is most consistent with acute presentation with potential threat to life or bodily function.   PLAN: We will obtain CBC, CMP, lipase, urinalysis, CT of the abdomen and pelvis, CT head, EKG.  Will give Zofran, pain medication.  We will give IV fluids.  Will monitor for further seizure-like activity.  Currently he is awake, alert and oriented x3.  His blood pressure is currently 97/70 but he is afebrile.  He states it is normal for his blood pressure to run in the 90s.   MEDICATIONS GIVEN IN ED: Medications  sodium chloride 0.9 % bolus 500 mL (has no administration in time range)  ondansetron (ZOFRAN) injection 4 mg (4 mg Intravenous Not Given 11/12/21 0709)  fentaNYL (SUBLIMAZE) injection 50 mcg (50  mcg Intravenous Given 11/12/21 0642)  iohexol (OMNIPAQUE) 300 MG/ML solution 75 mL (75 mLs Intravenous Contrast Given 11/12/21 0651)     ED COURSE: Labs show no leukocytosis.  Slight drop in hemoglobin from 12 down to 10.2 in the past 2 months.  Minimally elevated creatinine but this appears close to his baseline.  He does have a metabolic acidosis but normal anion gap.  LFTs, lipase unremarkable.  Troponin negative.  CTs of the head, abdomen pelvis pending.  Nursing staff unsuccessful in flushing his Foley catheter and will need to be exchanged and he may need continuous bladder irrigation for his gross hematuria.  Signed out the oncoming ED physician.   CONSULTS: Dispo pending further work-up.   OUTSIDE RECORDS REVIEWED: Reviewed multiple previous [facility] and [facility] notes within the past year.       FINAL CLINICAL IMPRESSION(S) / ED DIAGNOSES   Final diagnoses:  Gross hematuria  Seizure-like activity ([place])     Rx / DC Orders   ED Discharge Orders     None        Note:  This document was prepared using Dragon voice recognition software and may include unintentional dictation errors.   Lorane Cousar, Delice Bison, DO 11/12/21 [phone]

## 2021-11-12 NOTE — ED Notes (Signed)
Bladder scan indicated 117m in bladder

## 2021-11-12 NOTE — H&P (Signed)
History and Physical    Jacob Avila CHE:[id number] DOB: 05/29/1927 DOA: 11/12/2021  Referring MD/NP/PA:   PCP: Albina Billet, MD   Patient coming from:  The patient is coming from SNF   Chief Complaint: Hematuria, seizure, altered mental status  HPI: Jacob Avila is a 88 y.o. male with medical history significant of hypertension, hyperlipidemia, depression, atrial fibrillation on Eliquis, systolic CHF with a EF of 40-45%, indwelling Foley catheter due to BPH, CKD-3a, who presents with hematuria, seizure, altered mental status.  Per her daughter (I called her daughter by phone), patient has indwelling Foley catheter which was changed 1 week ago.  Patient was found to have blood in the catheter since yesterday.  Not sure if patient has symptoms of UTI. Per his daughter, patient had 2 episode of seizure-like activity. Patient is confused.  At her normal baseline, patient is orientated to person and place, but not always orientated to time. Now patient is altered, barely arousable, not oriented x3.  Patient moves all extremities on painful stimuli when I saw patient the ED. Per his daughter, patient has nausea and vomited twice and complains of abdominal pain.  Not sure if patient has diarrhea.  Patient does not seem to have chest pain, cough, shortness breath.  His daughter is not very sure when patient took his last dose of Eliquis, but possibly yesterday morning.   Data reviewed independently and ED Course: pt was found to have BNP 101, INR 1.6, troponin level 6, WBC 9.6, hemoglobin 10.2 (Hgb 12.0 on 09/24/2021), worsening renal function, temperature normal, blood pressure soft at 96/73, heart rate 123, 88, RR 38, 18, oxygen saturation 96% on room air.  CT of head is negative for acute intracranial abnormality. Pt is admitted to stepdown as inpatient.  Dr. Diamantina Providence of urology and Dr. Quinn Axe of neurology consulted.   CT-abd/pelvis 1. Malpositioned Foley catheter with balloon inflated in  the prostatic urethra, underlying marked prostatomegaly. Superimposed distended urinary bladder containing a large 9 cm amorphous layering density which is likely intravesical hematoma/hemorrhage. Subsequent bilateral hydronephrosis and hydroureter.   2. Abdominal surgery since September with apparent resection of the obstructing transverse colon mass. New right abdominal ostomy with no evidence of bowel obstruction or other adverse features.   3. No metastatic disease identified. Small indeterminate but probably benign liver lesions, recommend routine cancer surveillance imaging.   4.  Aortic Atherosclerosis (ICD10-I70.0).     US-pelvis Balloon of the Foley catheter is visualized within the bladder. Redemonstrated is heterogeneous layering material within the bladder. Given history of hematuria, this is favored to represent blood products. If there is concern for an underlying lesion, further evaluation with cystoscopy is recommended, when clinically appropriate.    EKG: I have personally reviewed.  Atrial fibrillation, QTc 474, low voltage, LAD.   Review of Systems: Could not be reviewed due to altered mental status.   Allergy: No Known Allergies  Past Medical History:  Diagnosis Date   1st degree AV block 05/13/2012   Arteriosclerosis of coronary artery 11/12/2014   Atrial fibrillation ([place]) 11/30/2016   BP (high blood pressure) 05/12/2012   Bradycardia 05/12/2012   Dizziness 05/12/2012   HLD (hyperlipidemia)     Past Surgical History:  Procedure Laterality Date   CHOLECYSTECTOMY  2008   SHOULDER SURGERY  2005    Social History:  reports that he quit smoking about 43 years ago. His smoking use included cigarettes. He has been exposed to tobacco smoke. He has never used smokeless tobacco.  He reports that he does not drink alcohol and does not use drugs.  Family History: No family history on file.  Could not reviewed due to altered mental status.  Prior to Admission  medications   Medication Sig Start Date End Date Taking? Authorizing Provider  amiodarone (PACERONE) 200 MG tablet Take by mouth. 10/29/21 10/29/22 Yes [provider]  apixaban (ELIQUIS) 5 MG TABS tablet Take by mouth. 10/28/21  Yes [provider]  atorvastatin (LIPITOR) 80 MG tablet Take by mouth. 05/02/21  Yes [provider]  finasteride (PROSCAR) 5 MG tablet Take by mouth. 10/29/21 10/29/22 Yes [provider]  magnesium oxide (MAG-OX) 400 MG tablet Take by mouth. 10/28/21 10/28/22 Yes [provider]  Melatonin 3 MG TBDP Take by mouth. 10/28/21  Yes [provider]  metoprolol succinate (TOPROL-XL) 25 MG 24 hr tablet Take 25 mg by mouth daily.   Yes [provider]  mirtazapine (REMERON) 7.5 MG tablet Take by mouth. 10/28/21 10/28/22 Yes [provider]  nitroGLYCERIN (NITROSTAT) 0.4 MG SL tablet Place 1 tablet under the tongue every 5 (five) minutes as needed. 10/28/21 10/28/22 Yes [provider]  senna-docusate (SENOKOT-S) 8.6-50 MG tablet Take 2 tablets by mouth at bedtime. 10/28/21  Yes [provider]  tamsulosin (FLOMAX) 0.4 MG CAPS capsule Take by mouth. 10/28/21 10/28/22 Yes [provider]    Physical Exam: Vitals:   11/12/21 1353 11/12/21 1530 11/12/21 1545 11/12/21 1715  BP: 104/76 113/75 112/66 109/69  Pulse:  85 88   Resp:  18 18   Temp:  98.3 F (36.8 C)    TempSrc:  Oral    SpO2:  97%    Weight:      Height:       General: Not in acute distress HEENT:       Eyes: PERRL, EOMI, no scleral icterus.       ENT: No discharge from the ears and nose       Neck: No JVD, no bruit, no mass felt. Heme: No neck lymph node enlargement. Cardiac: S1/S2, RRR, No murmurs, No gallops or rubs. Respiratory: No rales, wheezing, rhonchi or rubs. GI: Soft, nondistended, nontender, no organomegaly, BS present. GU: No hematuria Ext: No pitting leg edema bilaterally. 1+DP/PT pulse  bilaterally. Musculoskeletal: No joint deformities, No joint redness or warmth, no limitation of ROM in spin. Skin: No rashes.  Neuro: Altered, barely arousable, not following command, not oriented X3, cranial nerves II-XII grossly intact, moves all extremities on painful stimuli. Psych: Patient is not psychotic, no suicidal or hemocidal ideation.  Labs on Admission: I have personally reviewed following labs and imaging studies  CBC: Recent Labs  Lab 11/12/21 0550 11/12/21 1432  WBC 9.6 15.5*  NEUTROABS 7.3  --   HGB 10.2* 9.4*  HCT 33.3* 30.8*  MCV 75.3* 76.2*  PLT 205 732   Basic Metabolic Panel: Recent Labs  Lab 11/12/21 0550  NA 135  K 4.3  CL 108  CO2 17*  GLUCOSE 125*  BUN 26*  CREATININE 1.51*  CALCIUM 8.7*   GFR: Estimated Creatinine Clearance: 35 mL/min (A) (by C-G formula based on SCr of 1.51 mg/dL (H)). Liver Function Tests: Recent Labs  Lab 11/12/21 0550  AST 19  ALT 17  ALKPHOS 81  BILITOT 0.5  PROT 7.0  ALBUMIN 2.9*   Recent Labs  Lab 11/12/21 0550  LIPASE 42   No results for input(s): "AMMONIA" in the last 168 hours. Coagulation Profile: Recent Labs  Lab 11/12/21 0550  INR 1.6*   Cardiac Enzymes: No results for input(s): "CKTOTAL", "CKMB", "CKMBINDEX", "TROPONINI" in the last 168 hours. BNP (last 3 results) No results for input(s): "PROBNP" in the last 8760 hours. HbA1C: No results for input(s): "HGBA1C" in the last 72 hours. CBG: Recent Labs  Lab 11/12/21 1130  GLUCAP 154*   Lipid Profile: No results for input(s): "CHOL", "HDL", "LDLCALC", "TRIG", "CHOLHDL", "LDLDIRECT" in the last 72 hours. Thyroid Function Tests: No results for input(s): "TSH", "T4TOTAL", "FREET4", "T3FREE", "THYROIDAB" in the last 72 hours. Anemia Panel: No results for input(s): "VITAMINB12", "FOLATE", "FERRITIN", "TIBC", "IRON", "RETICCTPCT" in the last 72 hours. Urine analysis:    Component Value Date/Time   COLORURINE AMBER (A) 09/24/2021 0729    APPEARANCEUR CLOUDY (A) 09/24/2021 0729   APPEARANCEUR Clear 02/22/2015 0912   LABSPEC 1.026 09/24/2021 0729   PHURINE 5.0 09/24/2021 0729   GLUCOSEU 50 (A) 09/24/2021 0729   HGBUR LARGE (A) 09/24/2021 0729   BILIRUBINUR NEGATIVE 09/24/2021 0729   BILIRUBINUR Negative 02/22/2015 0912   KETONESUR 20 (A) 09/24/2021 0729   PROTEINUR 100 (A) 09/24/2021 0729   NITRITE NEGATIVE 09/24/2021 0729   LEUKOCYTESUR MODERATE (A) 09/24/2021 0729   Sepsis Labs: '@LABRCNTIP'$ (procalcitonin:4,lacticidven:4) )No results found for this or any previous visit (from the past 240 hour(s)).   Radiological Exams on Admission: US Pelvis Limited  Result Date: 11/12/2021 CLINICAL DATA:  Hematuria EXAM: LIMITED ULTRASOUND OF PELVIS TECHNIQUE: Limited transabdominal ultrasound examination of the pelvis was performed. COMPARISON:  None Available. FINDINGS: Balloon of the Foley catheter is visualized within the bladder. Redemonstrated is heterogeneous layering material which is slightly echogenic given history of hematuria could represent blood products. Other findings:  None. IMPRESSION: Balloon of the Foley catheter is visualized within the bladder. Redemonstrated is heterogeneous layering material within the bladder. Given history of hematuria, this is favored to represent blood products. If there is concern for an underlying lesion, further evaluation with cystoscopy is recommended, when clinically appropriate. Electronically Signed   By: Marin Roberts M.D.   On: 11/12/2021 09:51   CT ABDOMEN PELVIS W CONTRAST  Result Date: 11/12/2021 CLINICAL DATA:  88 year old male with seizure-like activity, altered mental status, gross hematuria. Obstructing transverse colon mass in September. EXAM: CT ABDOMEN AND PELVIS WITH CONTRAST TECHNIQUE: Multidetector CT imaging of the abdomen and pelvis was performed using the standard protocol following bolus administration of intravenous contrast. RADIATION DOSE REDUCTION: This exam was  performed according to the departmental dose-optimization program which includes automated exposure control, adjustment of the mA and/or kV according to patient size and/or use of iterative reconstruction technique. CONTRAST:  39m OMNIPAQUE IOHEXOL 300 MG/ML  SOLN COMPARISON:  Preoperative CT Abdomen and Pelvis 09/24/2021. CT Abdomen and Pelvis 12/26/2009. FINDINGS: Lower chest: Stable tortuous descending thoracic aorta with calcified atherosclerosis. Partially visible coronary artery calcified plaque or stents. No pericardial or pleural effusion. No significant cardiomegaly. Mild lung base scarring. Hepatobiliary: Small circumscribed low-density areas in the liver appear stable since September and too small to characterize but likely benign. Recommend routine cancer surveillance. Absent gallbladder. Pancreas: Negative. Spleen: Negative. Adrenals/Urinary Tract: Chronic left adrenal lipid rich adenoma is unchanged since 2011 and benign (no follow-up imaging recommended). right adrenal gland is normal. Bilateral simple fluid density renal cysts are also chronic and benign (no follow-up imaging recommended). Symmetric renal contrast enhancement but new bilateral hydronephrosis and hydroureter. On delayed images there is no renal contrast excretion. Calcified renal artery atherosclerosis again noted. Abnormal urinary bladder is distended, contains fluid and  gas, and also a relatively large 9.4 cm area of amorphous increased and mostly layering density. Malpositioned Foley catheter balloon is inflated in the prostatic urethra. Stomach/Bowel: New right abdominal colostomy and apparent resection of the abnormal transverse colon since September. There is a bowel staple line in the mid epigastrium now on series 2, image 17. Decompressed residual transverse colon in the left upper abdomen with decompressed descending colon, redundant sigmoid colon, and small volume retained stool in the rectum. Midline anterior abdominal  wall postoperative changes are new along with right abdominal ostomy. No dilated large or small bowel. Small volume fluid and gas in the stomach and duodenum. No free air or free fluid identified. Vascular/Lymphatic: Aortoiliac calcified atherosclerosis. Major arterial structures in the abdomen and pelvis are patent. Tortuous but nonaneurysmal abdominal aorta. Diminutive portal venous system with insufficient contrast on the initial images. On delayed images the portal venous system appears grossly patent. No lymphadenopathy identified. Reproductive: Marked prostatomegaly, and the Foley catheter balloon is inflated in the prostatic urethra today (series 2, image 74). Other: No pelvic free fluid. Musculoskeletal: Stable visualized osseous structures. Chronic spine degeneration. Chronic pubic symphysis degeneration. No acute or suspicious osseous lesion identified. IMPRESSION: 1. Malpositioned Foley catheter with balloon inflated in the prostatic urethra, underlying marked prostatomegaly. Superimposed distended urinary bladder containing a large 9 cm amorphous layering density which is likely intravesical hematoma/hemorrhage. Subsequent bilateral hydronephrosis and hydroureter. 2. Abdominal surgery since September with apparent resection of the obstructing transverse colon mass. New right abdominal ostomy with no evidence of bowel obstruction or other adverse features. 3. No metastatic disease identified. Small indeterminate but probably benign liver lesions, recommend routine cancer surveillance imaging. 4.  Aortic Atherosclerosis (ICD10-I70.0). Electronically Signed   By: Genevie Ann M.D.   On: 11/12/2021 07:49   CT HEAD WO CONTRAST (5MM)  Result Date: 11/12/2021 CLINICAL DATA:  88 year old male with seizure-like activity, altered mental status, gross hematuria. EXAM: CT HEAD WITHOUT CONTRAST TECHNIQUE: Contiguous axial images were obtained from the base of the skull through the vertex without intravenous contrast.  RADIATION DOSE REDUCTION: This exam was performed according to the departmental dose-optimization program which includes automated exposure control, adjustment of the mA and/or kV according to patient size and/or use of iterative reconstruction technique. COMPARISON:  Head CT 07/05/2018. FINDINGS: Brain: Cerebral volume has mildly decreased since 2020 but remains normal for age. No midline shift, ventriculomegaly, mass effect, evidence of mass lesion, intracranial hemorrhage or evidence of cortically based acute infarction. Patchy and confluent bilateral cerebral white matter hypodensity is stable but progressed, along with heterogeneity in the bilateral deep gray nuclei. No cortical encephalomalacia, brainstem and cerebellum appears stable and negative. Vascular: Calcified atherosclerosis at the skull base. No suspicious intracranial vascular hyperdensity. Skull: Stable and intact. Sinuses/Orbits: New low-density sinus fluid level or retention cyst in the left maxillary sinus with underlying chronic periosteal thickening there. But other Visualized paranasal sinuses and mastoids are clear. Other: No orbit or scalp soft tissue injury identified. IMPRESSION: 1. No acute intracranial abnormality identified. 2. Evidence of progressed small vessel disease since 2020. 3. Mild acute on chronic left maxillary sinusitis. Electronically Signed   By: Genevie Ann M.D.   On: 11/12/2021 07:39      Assessment/Plan Principal Problem:   Seizure ([place]) Active Problems:   Acute metabolic encephalopathy   Hematuria   BPH (benign prostatic hyperplasia)   Acute blood loss anemia   Atrial fibrillation, chronic (HCC)   Chronic systolic CHF (congestive heart failure) ([place])   HTN (  hypertension)   HLD (hyperlipidemia)   Depression   Chronic indwelling Foley catheter   Assessment and Plan:  Seizure [facility]): consulted Dr. Quinn Axe of neuro. patient still has altered mental status, barely arousable.  CT head negative.  -Admitted  to stepdown as inpatient -Seizure precaution - prn  Ativan for seizure - Patient was loaded with 1500 mg of Keppra x2 dose in ED --> will continue Keppra 500 mg bid --> will f/u neuro recommendations - EEG  Acute metabolic encephalopathy: CT head is negative.  May be due to postictal. -Frequent neurochecks -Will hold all oral medications until mental status improves  Hematuria: CT showed significant clot burden in the bladder and Foley catheter within the prostatic urethra.  Dr. Diamantina Providence of urology is consulted --> planning for HOLP (holmium laser enucleation for prostate) tomorrow -started CBI  -Hold Eliquis -start cefepime empirically --> switched to rocephin -Follow-up urinalysis, urine culture, blood culture  BPH: -Hold Proscar and Flomax until mental status improves  Acute blood loss anemia: Hemoglobin dropped from 12.0-10.2. -Follow-up CBC every 6 hours -INR/PTT, type screen   Chronic systolic CHF (congestive heart failure) ([place]): 2D echo on 06/09/2018 showed EF of 40-45%.  Patient does not have leg edema JVD.  CHF seem to be compensated.  BNP 101. -Watch volume status closely  Atrial fibrillation, chronic (HCC) -Hold Eliquis due to materia -Hold oral metoprolol, amiodarone until mental status improved -As needed IV metoprolol 2.5 mg every 2 hours for heart rate> 125  HTN (hypertension): Blood pressure is soft -Hold all blood pressure medications -monitoring Bp closely  HLD (hyperlipidemia) -Hold Lipitor  Depression -Hold Remeron      DVT ppx: SCD  Code Status: DNR per his daughter, but okay to use vasopressor if needed  Family Communication:  Yes, patient's daughter by phone  Disposition Plan:  Anticipate discharge back to previous environment  Consults called:   Dr. Diamantina Providence of urology and Dr. Quinn Axe of neurology consulted.  Admission status and Level of care: Stepdown:      SDU/inpation          Dispo: The patient is from: SNF              Anticipated  d/c is to: SNF              Anticipated d/c date is: 2 days              Patient currently is not medically stable to d/c.    Severity of Illness:  The appropriate patient status for this patient is INPATIENT. Inpatient status is judged to be reasonable and necessary in order to provide the required intensity of service to ensure the patient's safety. The patient's presenting symptoms, physical exam findings, and initial radiographic and laboratory data in the context of their chronic comorbidities is felt to place them at high risk for further clinical deterioration. Furthermore, it is not anticipated that the patient will be medically stable for discharge from the hospital within 2 midnights of admission.   * I certify that at the point of admission it is my clinical judgment that the patient will require inpatient hospital care spanning beyond 2 midnights from the point of admission due to high intensity of service, high risk for further deterioration and high frequency of surveillance required.*       Date of Service 11/12/2021    Ivor Costa [facility]   If 7PM-7AM, please contact night-coverage [email or web address] 11/12/2021, 5:38 PM

## 2021-11-12 NOTE — Progress Notes (Signed)
Pt arrival from ED at [place]. Pt placed on continuous monitoring, VSS, and pt denying pain. Pt oriented x3, neuro assessment complete, see flow sheets. Bilateral lung sounds clear, diminished. Pt on room air. Bowel sounds present, colostomy in place on RUQ. Pt given CHG bath, and given call light. Pt resting comfortably in bed.

## 2021-11-13 ENCOUNTER — Encounter: Payer: Self-pay | Admitting: Internal Medicine

## 2021-11-13 ENCOUNTER — Inpatient Hospital Stay: Payer: PPO | Admitting: Anesthesiology

## 2021-11-13 ENCOUNTER — Inpatient Hospital Stay
Admission: RE | Admit: 2021-11-13 | Discharge: 2021-11-13 | Disposition: A | Discharge status: Home / Self Care | Payer: PPO | Source: Ambulatory Visit | Attending: Urology | Admitting: Urology

## 2021-11-13 ENCOUNTER — Encounter
Admission: EM | Disposition: A | Discharge status: Skilled Nursing Facility | Payer: Self-pay | Source: Home / Self Care | Attending: Internal Medicine

## 2021-11-13 DIAGNOSIS — R3129 Other microscopic hematuria: Secondary | ICD-10-CM | POA: Diagnosis not present

## 2021-11-13 DIAGNOSIS — R569 Unspecified convulsions: Secondary | ICD-10-CM | POA: Diagnosis not present

## 2021-11-13 DIAGNOSIS — N401 Enlarged prostate with lower urinary tract symptoms: Secondary | ICD-10-CM

## 2021-11-13 DIAGNOSIS — R310 Gross hematuria: Secondary | ICD-10-CM

## 2021-11-13 DIAGNOSIS — Z85048 Personal history of other malignant neoplasm of rectum, rectosigmoid junction, and anus: Secondary | ICD-10-CM

## 2021-11-13 DIAGNOSIS — N400 Enlarged prostate without lower urinary tract symptoms: Secondary | ICD-10-CM | POA: Diagnosis not present

## 2021-11-13 DIAGNOSIS — N3289 Other specified disorders of bladder: Secondary | ICD-10-CM | POA: Diagnosis not present

## 2021-11-13 DIAGNOSIS — R338 Other retention of urine: Secondary | ICD-10-CM

## 2021-11-13 DIAGNOSIS — R339 Retention of urine, unspecified: Secondary | ICD-10-CM

## 2021-11-13 HISTORY — PX: CYSTOSCOPY: SHX5120

## 2021-11-13 HISTORY — PX: HOLEP-LASER ENUCLEATION OF THE PROSTATE WITH MORCELLATION: SHX6641

## 2021-11-13 LAB — CBC
HCT: 25.2 % — ABNORMAL LOW (ref 39.0–52.0)
HCT: 26.8 % — ABNORMAL LOW (ref 39.0–52.0)
HCT: 25.1 % — ABNORMAL LOW (ref 39.0–52.0)
HCT: 22.7 % — ABNORMAL LOW (ref 39.0–52.0)
Hemoglobin: 7.9 g/dL — ABNORMAL LOW (ref 13.0–17.0)
Hemoglobin: 8.3 g/dL — ABNORMAL LOW (ref 13.0–17.0)
Hemoglobin: 7.9 g/dL — ABNORMAL LOW (ref 13.0–17.0)
Hemoglobin: 7.1 g/dL — ABNORMAL LOW (ref 13.0–17.0)
MCH: 23.4 pg — ABNORMAL LOW (ref 26.0–34.0)
MCH: 23.2 pg — ABNORMAL LOW (ref 26.0–34.0)
MCH: 23.4 pg — ABNORMAL LOW (ref 26.0–34.0)
MCH: 23.7 pg — ABNORMAL LOW (ref 26.0–34.0)
MCHC: 31.3 g/dL (ref 30.0–36.0)
MCHC: 31 g/dL (ref 30.0–36.0)
MCHC: 31.5 g/dL (ref 30.0–36.0)
MCHC: 31.3 g/dL (ref 30.0–36.0)
MCV: 74.8 fL — ABNORMAL LOW (ref 80.0–100.0)
MCV: 74.9 fL — ABNORMAL LOW (ref 80.0–100.0)
MCV: 74.5 fL — ABNORMAL LOW (ref 80.0–100.0)
MCV: 75.7 fL — ABNORMAL LOW (ref 80.0–100.0)
Platelets: 210 10*3/uL (ref 150–400)
Platelets: 213 10*3/uL (ref 150–400)
Platelets: 192 10*3/uL (ref 150–400)
Platelets: 172 10*3/uL (ref 150–400)
RBC: 3.37 MIL/uL — ABNORMAL LOW (ref 4.22–5.81)
RBC: 3.58 MIL/uL — ABNORMAL LOW (ref 4.22–5.81)
RBC: 3.37 MIL/uL — ABNORMAL LOW (ref 4.22–5.81)
RBC: 3 MIL/uL — ABNORMAL LOW (ref 4.22–5.81)
RDW: 16.7 % — ABNORMAL HIGH (ref 11.5–15.5)
RDW: 16.9 % — ABNORMAL HIGH (ref 11.5–15.5)
RDW: 16.7 % — ABNORMAL HIGH (ref 11.5–15.5)
RDW: 16.7 % — ABNORMAL HIGH (ref 11.5–15.5)
WBC: 15.5 10*3/uL — ABNORMAL HIGH (ref 4.0–10.5)
WBC: 15.8 10*3/uL — ABNORMAL HIGH (ref 4.0–10.5)
WBC: 14.6 10*3/uL — ABNORMAL HIGH (ref 4.0–10.5)
WBC: 17.3 10*3/uL — ABNORMAL HIGH (ref 4.0–10.5)
nRBC: 0 % (ref 0.0–0.2)
nRBC: 0 % (ref 0.0–0.2)
nRBC: 0 % (ref 0.0–0.2)
nRBC: 0 % (ref 0.0–0.2)

## 2021-11-13 LAB — BASIC METABOLIC PANEL
Anion gap: 3 — ABNORMAL LOW (ref 5–15)
BUN: 33 mg/dL — ABNORMAL HIGH (ref 8–23)
CO2: 20 mmol/L — ABNORMAL LOW (ref 22–32)
Calcium: 8 mg/dL — ABNORMAL LOW (ref 8.9–10.3)
Chloride: 116 mmol/L — ABNORMAL HIGH (ref 98–111)
Creatinine, Ser: 1.61 mg/dL — ABNORMAL HIGH (ref 0.61–1.24)
GFR, Estimated: 40 mL/min — ABNORMAL LOW (ref >60)
Glucose, Bld: 110 mg/dL — ABNORMAL HIGH (ref 70–99)
Potassium: 4.8 mmol/L (ref 3.5–5.1)
Sodium: 139 mmol/L (ref 135–145)

## 2021-11-13 LAB — GLUCOSE, CAPILLARY: Glucose-Capillary: 103 mg/dL — ABNORMAL HIGH (ref 70–99)

## 2021-11-13 SURGERY — ENUCLEATION, PROSTATE, USING LASER, WITH MORCELLATION
Anesthesia: General

## 2021-11-13 MED ORDER — SODIUM CHLORIDE (PF) 0.9 % IJ SOLN
INTRAMUSCULAR | Status: AC
  Filled 2021-11-13: qty 10

## 2021-11-13 MED ORDER — FENTANYL CITRATE (PF) 100 MCG/2ML IJ SOLN
INTRAMUSCULAR | Status: DC | PRN
  Administered 2021-11-13: 25 ug via INTRAVENOUS
  Administered 2021-11-13: 50 ug via INTRAVENOUS

## 2021-11-13 MED ORDER — CEFAZOLIN SODIUM-DEXTROSE 2-3 GM-%(50ML) IV SOLR
INTRAVENOUS | Status: DC | PRN
  Administered 2021-11-13: 2 g via INTRAVENOUS

## 2021-11-13 MED ORDER — DEXAMETHASONE SODIUM PHOSPHATE 10 MG/ML IJ SOLN
INTRAMUSCULAR | Status: AC
  Filled 2021-11-13: qty 1

## 2021-11-13 MED ORDER — TAMSULOSIN HCL 0.4 MG PO CAPS
0.4000 mg | ORAL_CAPSULE | Freq: Every day | ORAL | Status: DC
  Administered 2021-11-14 – 2021-11-19 (×6): 0.4 mg via ORAL
  Filled 2021-11-13 (×6): qty 1

## 2021-11-13 MED ORDER — SODIUM CHLORIDE 0.9 % IV SOLN: INTRAVENOUS | Status: DC | PRN

## 2021-11-13 MED ORDER — PROPOFOL 10 MG/ML IV BOLUS
INTRAVENOUS | Status: DC | PRN
  Administered 2021-11-13: 30 mg via INTRAVENOUS

## 2021-11-13 MED ORDER — DILTIAZEM HCL-DEXTROSE 125-5 MG/125ML-% IV SOLN (PREMIX)
5.0000 mg/h | INTRAVENOUS | Status: DC
  Administered 2021-11-13 – 2021-11-14 (×2): 5 mg/h via INTRAVENOUS
  Filled 2021-11-13: qty 125

## 2021-11-13 MED ORDER — ROCURONIUM BROMIDE 100 MG/10ML IV SOLN
INTRAVENOUS | Status: DC | PRN
  Administered 2021-11-13: 20 mg via INTRAVENOUS
  Administered 2021-11-13: 50 mg via INTRAVENOUS

## 2021-11-13 MED ORDER — PHENYLEPHRINE 80 MCG/ML (10ML) SYRINGE FOR IV PUSH (FOR BLOOD PRESSURE SUPPORT)
PREFILLED_SYRINGE | INTRAVENOUS | Status: DC | PRN
  Administered 2021-11-13: 40 ug via INTRAVENOUS
  Administered 2021-11-13: 80 ug via INTRAVENOUS
  Administered 2021-11-13 (×2): 40 ug via INTRAVENOUS
  Administered 2021-11-13 (×3): 80 ug via INTRAVENOUS
  Administered 2021-11-13: 40 ug via INTRAVENOUS

## 2021-11-13 MED ORDER — NITROGLYCERIN 0.4 MG SL SUBL: 0.4000 mg | SUBLINGUAL_TABLET | SUBLINGUAL | Status: DC | PRN

## 2021-11-13 MED ORDER — AMIODARONE HCL 200 MG PO TABS
200.0000 mg | ORAL_TABLET | Freq: Every day | ORAL | Status: DC
  Administered 2021-11-14 – 2021-11-20 (×6): 200 mg via ORAL
  Filled 2021-11-13 (×6): qty 1

## 2021-11-13 MED ORDER — OXYCODONE HCL 5 MG/5ML PO SOLN: 5.0000 mg | Freq: Once | ORAL | Status: DC | PRN

## 2021-11-13 MED ORDER — SODIUM CHLORIDE 0.9 % IR SOLN
Status: DC | PRN
  Administered 2021-11-13: 66000 mL

## 2021-11-13 MED ORDER — METOPROLOL SUCCINATE ER 25 MG PO TB24
25.0000 mg | ORAL_TABLET | Freq: Every day | ORAL | Status: DC
  Filled 2021-11-13: qty 1

## 2021-11-13 MED ORDER — PROPOFOL 10 MG/ML IV BOLUS
INTRAVENOUS | Status: AC
  Filled 2021-11-13: qty 20

## 2021-11-13 MED ORDER — DILTIAZEM HCL-DEXTROSE 125-5 MG/125ML-% IV SOLN (PREMIX)
INTRAVENOUS | Status: DC | PRN
  Administered 2021-11-13: 5 mg via INTRAVENOUS
  Administered 2021-11-13: 5 mg/h via INTRAVENOUS
  Administered 2021-11-13: 5 mg via INTRAVENOUS

## 2021-11-13 MED ORDER — ATORVASTATIN CALCIUM 20 MG PO TABS
80.0000 mg | ORAL_TABLET | Freq: Every day | ORAL | Status: DC
  Administered 2021-11-14 – 2021-11-20 (×6): 80 mg via ORAL
  Filled 2021-11-13 (×3): qty 4
  Filled 2021-11-13: qty 1
  Filled 2021-11-13: qty 4
  Filled 2021-11-13: qty 1

## 2021-11-13 MED ORDER — SUGAMMADEX SODIUM 200 MG/2ML IV SOLN
INTRAVENOUS | Status: DC | PRN
  Administered 2021-11-13: 200 mg via INTRAVENOUS

## 2021-11-13 MED ORDER — LIDOCAINE HCL (PF) 2 % IJ SOLN
INTRAMUSCULAR | Status: AC
  Filled 2021-11-13: qty 5

## 2021-11-13 MED ORDER — CEFAZOLIN SODIUM 1 G IJ SOLR
INTRAMUSCULAR | Status: AC
  Filled 2021-11-13: qty 20

## 2021-11-13 MED ORDER — DEXAMETHASONE SODIUM PHOSPHATE 10 MG/ML IJ SOLN
INTRAMUSCULAR | Status: DC | PRN
  Administered 2021-11-13: 10 mg via INTRAVENOUS

## 2021-11-13 MED ORDER — FINASTERIDE 5 MG PO TABS
5.0000 mg | ORAL_TABLET | Freq: Every day | ORAL | Status: DC
  Administered 2021-11-14 – 2021-11-20 (×6): 5 mg via ORAL
  Filled 2021-11-13 (×6): qty 1

## 2021-11-13 MED ORDER — EPHEDRINE SULFATE (PRESSORS) 50 MG/ML IJ SOLN
INTRAMUSCULAR | Status: DC | PRN
  Administered 2021-11-13: 5 mg via INTRAVENOUS
  Administered 2021-11-13: 10 mg via INTRAVENOUS

## 2021-11-13 MED ORDER — ONDANSETRON HCL 4 MG/2ML IJ SOLN
INTRAMUSCULAR | Status: AC
  Filled 2021-11-13: qty 2

## 2021-11-13 MED ORDER — DILTIAZEM HCL-DEXTROSE 125-5 MG/125ML-% IV SOLN (PREMIX)
INTRAVENOUS | Status: AC
  Filled 2021-11-13: qty 125

## 2021-11-13 MED ORDER — FENTANYL CITRATE (PF) 100 MCG/2ML IJ SOLN: 25.0000 ug | INTRAMUSCULAR | Status: DC | PRN

## 2021-11-13 MED ORDER — LIDOCAINE HCL (CARDIAC) PF 100 MG/5ML IV SOSY
PREFILLED_SYRINGE | INTRAVENOUS | Status: DC | PRN
  Administered 2021-11-13: 60 mg via INTRAVENOUS
  Administered 2021-11-13: 40 mg via INTRAVENOUS

## 2021-11-13 MED ORDER — ONDANSETRON HCL 4 MG/2ML IJ SOLN
INTRAMUSCULAR | Status: DC | PRN
  Administered 2021-11-13: 4 mg via INTRAVENOUS

## 2021-11-13 MED ORDER — OXYCODONE HCL 5 MG PO TABS: 5.0000 mg | ORAL_TABLET | Freq: Once | ORAL | Status: DC | PRN

## 2021-11-13 MED ORDER — FENTANYL CITRATE (PF) 100 MCG/2ML IJ SOLN
INTRAMUSCULAR | Status: AC
  Filled 2021-11-13: qty 2

## 2021-11-13 SURGICAL SUPPLY — 48 items
ADAPTER IRRIG TUBE 2 SPIKE SOL (ADAPTER) ×2 IMPLANT
ADPR TBG 2 SPK PMP STRL ASCP (ADAPTER) ×2
BAG DRAIN SIEMENS DORNER NS (MISCELLANEOUS) ×1 IMPLANT
BAG DRN LRG CPC RND TRDRP CNTR (MISCELLANEOUS) ×1
BAG DRN NS LF (MISCELLANEOUS) ×1
BAG URO DRAIN 4000ML (MISCELLANEOUS) ×1 IMPLANT
BRUSH SCRUB EZ 1% IODOPHOR (MISCELLANEOUS) ×1 IMPLANT
CATH FOLEY 3WAY 30CC 24FR (CATHETERS) ×1
CATH URETL OPEN 5X70 (CATHETERS) ×1 IMPLANT
CATH URETL OPEN END 4X70 (CATHETERS) ×1 IMPLANT
CATH URTH STD 24FR FL 3W 2 (CATHETERS) ×1 IMPLANT
CONTAINER COLLECT MORCELLATR (MISCELLANEOUS) ×1 IMPLANT
CYBERBLADE MORCELLATOR DISP (MISCELLANEOUS) IMPLANT
CYBERBLADE POWER KIT DISP (MISCELLANEOUS) IMPLANT
DRAPE UTILITY 15X26 TOWEL STRL (DRAPES) IMPLANT
ELECT BIVAP BIPO 22/24 DONUT (ELECTROSURGICAL) ×1
ELECTRD BIVAP BIPO 22/24 DONUT (ELECTROSURGICAL) IMPLANT
FIBER LASER MOSES 550 DFL (Laser) ×1 IMPLANT
FILTER OVERFLOW MORCELLATOR (FILTER) ×1 IMPLANT
GAUZE 4X4 16PLY ~~LOC~~+RFID DBL (SPONGE) ×2 IMPLANT
GLOVE SURG UNDER POLY LF SZ7.5 (GLOVE) ×1 IMPLANT
GOWN STRL REUS W/ TWL LRG LVL3 (GOWN DISPOSABLE) ×1 IMPLANT
GOWN STRL REUS W/ TWL XL LVL3 (GOWN DISPOSABLE) ×1 IMPLANT
GOWN STRL REUS W/TWL LRG LVL3 (GOWN DISPOSABLE) ×1
GOWN STRL REUS W/TWL XL LVL3 (GOWN DISPOSABLE) ×1
GUIDEWIRE STR DUAL SENSOR (WIRE) ×1 IMPLANT
HOLDER FOLEY CATH W/STRAP (MISCELLANEOUS) ×1 IMPLANT
IV NS IRRIG 3000ML ARTHROMATIC (IV SOLUTION) ×5 IMPLANT
KIT TURNOVER CYSTO (KITS) ×1 IMPLANT
MANIFOLD NEPTUNE II (INSTRUMENTS) ×1 IMPLANT
MBRN O SEALING YLW 17 FOR INST (MISCELLANEOUS) ×1
MEMBRANE SLNG YLW 17 FOR INST (MISCELLANEOUS) ×1 IMPLANT
MORCELLATOR COLLECT CONTAINER (MISCELLANEOUS) ×1
MORCELLATOR OVERFLOW FILTER (FILTER) ×1
MORCELLATOR ROTATION 4.75 335 (MISCELLANEOUS) ×1 IMPLANT
PACK CYSTO AR (MISCELLANEOUS) ×1 IMPLANT
SET CYSTO W/LG BORE CLAMP LF (SET/KITS/TRAYS/PACK) ×1 IMPLANT
SET IRRIG Y TYPE TUR BLADDER L (SET/KITS/TRAYS/PACK) ×1 IMPLANT
SLEEVE PROTECTION STRL DISP (MISCELLANEOUS) ×2 IMPLANT
SURGILUBE 2OZ TUBE FLIPTOP (MISCELLANEOUS) ×1 IMPLANT
SYR TOOMEY IRRIG 70ML (MISCELLANEOUS) ×1
SYRINGE TOOMEY IRRIG 70ML (MISCELLANEOUS) ×1 IMPLANT
TRAP FLUID SMOKE EVACUATOR (MISCELLANEOUS) ×1 IMPLANT
TUBE PUMP MORCELLATOR PIRANHA (TUBING) ×1 IMPLANT
WATER STERILE IRR 1000ML POUR (IV SOLUTION) ×1 IMPLANT
WATER STERILE IRR 500ML POUR (IV SOLUTION) ×1 IMPLANT
cyber blade morcellator IMPLANT
extrudan tubi di connessione funnel IMPLANT

## 2021-11-13 NOTE — Plan of Care (Signed)
Patient is OTF at procedure, neurology will f/u tmrw.  Su Monks, MD [facility] [phone]  If 7pm- 7am, please page neurology on call as listed in [place].

## 2021-11-13 NOTE — Significant Event (Signed)
Received call from PACU RN.  Patient had rapid HR in OR, with rates up to 140s.  Started on cardizem gtt in OR  Will keep him on drip overnight for rate control.  Mental status was too poor this morning to tolerate PO medications.  PO amiodarone and metoprolol ordered to start tomorrow AM.  Eliquis will remain on hold  Discussed case with [facility] urology.  Case went well, was discussed with patients daughter.  Urology recommends to continue slow CBI overnight (orders placed).  Urology to see in AM to consider foley removal and make further recommendations.  Will monitor H/H.  Transfuse PRN.  Consider transfusion for hemoglobin<8  Neurology to follow up in AM  Ralene Muskrat MD

## 2021-11-13 NOTE — Transfer of Care (Signed)
Immediate Anesthesia Transfer of Care Note  Patient: Jacob Avila  Procedure(s) Performed: HOLEP-LASER ENUCLEATION OF THE PROSTATE WITH MORCELLATION CYSTOSCOPY WITH CLOT EVACUATION  Patient Location: PACU  Anesthesia Type:General  Level of Consciousness: alert  and confused  Airway & Oxygen Therapy: Patient Spontanous Breathing and Patient connected to face mask oxygen  Post-op Assessment: Report given to RN and Post -op Vital signs reviewed and stable  Post vital signs: Reviewed and stable  Last Vitals:  Vitals Value Taken Time  BP 96/63 11/13/21 1702  Temp 36.1 C 11/13/21 1700  Pulse 31 11/13/21 1706  Resp 21 11/13/21 1707  SpO2 98 % 11/13/21 1706  Vitals shown include unvalidated device data.  Last Pain:  Vitals:   11/13/21 1318  TempSrc: Temporal  PainSc: 0-No pain      Patients Stated Pain Goal: 0 (03/49/17 9150)  Complications: No notable events documented.

## 2021-11-13 NOTE — Anesthesia Procedure Notes (Signed)
Procedure Name: Intubation Date/Time: 11/13/2021 2:18 PM  Performed by: Jonna Clark, CRNAPre-anesthesia Checklist: Patient identified, Patient being monitored, Timeout performed, Emergency Drugs available and Suction available Patient Re-evaluated:Patient Re-evaluated prior to induction Oxygen Delivery Method: Circle system utilized Preoxygenation: Pre-oxygenation with 100% oxygen Induction Type: IV induction Ventilation: Mask ventilation without difficulty Laryngoscope Size: Mac and 4 Grade View: Grade I Tube type: Oral Tube size: 7.0 mm Number of attempts: 1 Placement Confirmation: ETT inserted through vocal cords under direct vision, positive ETCO2 and breath sounds checked- equal and bilateral Secured at: 21 cm Tube secured with: Tape Dental Injury: Teeth and Oropharynx as per pre-operative assessment

## 2021-11-13 NOTE — Anesthesia Preprocedure Evaluation (Addendum)
Anesthesia Evaluation  Patient identified by MRN, date of birth, ID band Patient confused    Reviewed: Allergy & Precautions, NPO status , Patient's Chart, lab work & pertinent test results  History of Anesthesia Complications Negative for: history of anesthetic complications  Airway Mallampati: III  TM Distance: >3 FB Neck ROM: full    Dental  (+) Edentulous Upper, Edentulous Lower   Pulmonary former smoker   Pulmonary exam normal        Cardiovascular hypertension, On Medications and On Home Beta Blockers + CAD, + Cardiac Stents and +CHF  + dysrhythmias Atrial Fibrillation      Neuro/Psych Seizures -,  PSYCHIATRIC DISORDERS  Depression   Dementia    GI/Hepatic negative GI ROS, Neg liver ROS,,,  Endo/Other  negative endocrine ROS    Renal/GU Renal disease (CKD)     Musculoskeletal   Abdominal   Peds  Hematology  (+) Blood dyscrasia, anemia Hgb 7.9    Anesthesia Other Findings Past Medical History: 05/13/2012: 1st degree AV block 11/12/2014: Arteriosclerosis of coronary artery 11/30/2016: Atrial fibrillation ([place]) 05/12/2012: BP (high blood pressure) 05/12/2012: Bradycardia 05/12/2012: Dizziness No date: HLD (hyperlipidemia)  Past Surgical History: 2008: CHOLECYSTECTOMY 2005: SHOULDER SURGERY  BMI    Body Mass Index: 24.07 kg/m      Reproductive/Obstetrics negative OB ROS                             Anesthesia Physical Anesthesia Plan  ASA: 3  Anesthesia Plan: General ETT   Post-op Pain Management: Ofirmev IV (intra-op)*   Induction: Intravenous  PONV Risk Score and Plan: Ondansetron, Dexamethasone and Treatment may vary due to age or medical condition  Airway Management Planned: Oral ETT  Additional Equipment:   Intra-op Plan:   Post-operative Plan: Extubation in OR  Informed Consent: I have reviewed the patients History and Physical, chart, labs and discussed the  procedure including the risks, benefits and alternatives for the proposed anesthesia with the patient or authorized representative who has indicated his/her understanding and acceptance.   Patient has DNR.  Discussed DNR with power of attorney and Continue DNR.   Dental Advisory Given and Consent reviewed with POA  Plan Discussed with: Anesthesiologist, CRNA and Surgeon  Anesthesia Plan Comments: (Patient consented for risks of anesthesia including but not limited to:  - adverse reactions to medications - damage to eyes, teeth, lips or other oral mucosa - nerve damage due to positioning  - sore throat or hoarseness - Damage to heart, brain, nerves, lungs, other parts of body or loss of life  Patient voiced understanding.)        Anesthesia Quick Evaluation

## 2021-11-13 NOTE — Anesthesia Postprocedure Evaluation (Signed)
Anesthesia Post Note  Patient: Jacob Avila  Procedure(s) Performed: HOLEP-LASER ENUCLEATION OF THE PROSTATE WITH MORCELLATION CYSTOSCOPY WITH CLOT EVACUATION  Patient location during evaluation: PACU Anesthesia Type: General Level of consciousness: awake Pain management: pain level controlled Vital Signs Assessment: post-procedure vital signs reviewed and stable Respiratory status: spontaneous breathing, nonlabored ventilation, respiratory function stable and patient connected to nasal cannula oxygen Cardiovascular status: blood pressure returned to baseline and stable Postop Assessment: no apparent nausea or vomiting Anesthetic complications: no   No notable events documented.   Last Vitals:  Vitals:   11/13/21 1810 11/13/21 1846  BP: 92/61 95/62  Pulse: 98 (!) 101  Resp: 20   Temp: (!) 36.1 C 36.7 C  SpO2: 100% 98%    Last Pain:  Vitals:   11/13/21 1810  TempSrc:   PainSc: 0-No pain                 Precious Haws Johncharles Fusselman

## 2021-11-13 NOTE — Op Note (Addendum)
Date of procedure: 11/13/21  Preoperative diagnosis:  BPH with urinary retention Gross hematuria  Postoperative diagnosis:  Same  Procedure: HoLEP (Holmium Laser Enucleation of the Prostate)  Surgeon: Nickolas Madrid, MD  Anesthesia: General  Complications: None  Intraoperative findings:  Large prostate with obstructing lateral lobes, arterial bleeder at 11:00 at the bladder neck controlled with laser Moderate bladder trabeculations, mild posterior bladder erythema from Foley, no suspicious lesions Verumontanum and ureteral orifices intact at conclusion of case  EBL: 50 mL  Specimens: Prostate chips  Enucleation time: 44 minutes  Morcellation time: 42 minutes(difficult small pieces at conclusion of morcellation)  Intra-op weight: 100g  Drains: 24 French three-way, 60 cc in balloon  Indication: Jacob Avila is a 88 y.o. patient with BPH and Foley dependent urinary retention as well as recurrent gross hematuria and clot retention.  He has had multiple admissions for hematuria and clot retention, and was admitted on 11/12/2021-in the setting of dropping hematocrit and persistent need for CBI despite irrigation, they opted for HOLEP to address both sides retention and recurrent gross hematuria.  After reviewing the management options for treatment, they elected to proceed with the above surgical procedure(s). We have discussed the potential benefits and risks of the procedure, side effects of the proposed treatment, the likelihood of the patient achieving the goals of the procedure, and any potential problems that might occur during the procedure or recuperation.  We specifically discussed the risks of bleeding, infection, hematuria and clot retention, need for additional procedures, possible overnight hospital stay, temporary urgency and incontinence, rare long-term incontinence, and retrograde ejaculation.  Informed consent has been obtained.   Description of procedure:  The  patient was taken to the operating room and general anesthesia was induced.  The patient was placed in the dorsal lithotomy position, prepped and draped in the usual sterile fashion, and preoperative antibiotics(Ancef) were administered.  SCDs were placed for DVT prophylaxis.  A preoperative time-out was performed.   Jacob Avila sounds were used to gently dilated the urethra up to 54F. The 8 French continuous flow resectoscope was inserted into the urethra using the visual obturator  The prostate was large with obstructing lateral lobes. The bladder was thoroughly inspected and notable for moderate trabeculations, mild erythema at the posterior bladder wall from catheter, but no suspicious lesions.  The ureteral orifices were located in orthotopic position.  The laser was set to 2 J and 60 Hz and was used to make a lambda incision just proximal to the verumontanum down to the level of the capsule. Early apical release was performed by making a circumferential mucosal incision proximal to the sphincter. A 6 o'clock incision was then made down to the level of the capsule from the bladder neck to the verumontanum.  The lateral lobes were then incised circumferentially until they were disconnected from the surrounding tissue.  The capsule was examined and laser was used for meticulous hemostasis.    The 7 French resectoscope was then switched out for the 26 French nephroscope and the lobes were morcellated using the  disposable Cyber Blade device and the tissue sent to pathology.  A 24 French three-way catheter was inserted easily with the aid of a catheter guide, and 60 cc were placed in the balloon.  Urine was light red.  The catheter irrigated easily with a Toomey syringe.  CBI was initiated.  Urine remained light to medium red despite CBI, and with his complex history I opted to re-insert the resectoscope with the bipolar  button.  The catheter was deflated and removed.  There were a few small areas of bleeding  near the bladder neck, and these were meticulously fulgurated.  Under low flow conditions, no bleeding was noted.  With the aid of a catheter guide a 24 French three-way Foley was reinserted into the bladder with return of light pink urine, and 60 mL were placed in the balloon.  The catheter irrigated easily and remained clear.    The patient tolerated the procedure well without any immediate complications and was extubated and transferred to the recovery room in stable condition.  Urine was clear on fast CBI.  Disposition: Stable to PACU  Plan: Check H/H in PACU Continue CBI overnight, titrate to light pink/clear urine Anticipate stopping CBI in the morning, potentially Foley removal tomorrow or Saturday Continue to hold anticoagulation  Nickolas Madrid, MD 11/13/2021

## 2021-11-13 NOTE — Interval H&P Note (Signed)
UROLOGY H&P UPDATE  Agree with prior H&P dated 11/12/21.   Cardiac: RRR Lungs: CTA bilaterally  Laterality: N/A Procedure: HoLEP  We discussed the risks and benefits of HoLEP at length.  The procedure requires general anesthesia and takes 1 to 2 hours, and a holmium laser is used to enucleate the prostate and push this tissue into the bladder.  A morcellator is then used to remove this tissue, which is sent for pathology.  The vast majority(>95%) of patients are able to discharge the same day with a catheter in place for 2 to 3 days, and will follow-up in clinic for a voiding trial.  We specifically discussed the risks of bleeding, infection, retrograde ejaculation, temporary urgency and urge incontinence, very low risk of long-term incontinence, urethral stricture/bladder neck contracture, pathologic evaluation of prostate tissue and possible detection of prostate cancer or other malignancy, and possible need for additional procedures.  I had a very long conversation with patient and his daughter Jacob Avila about the above risks, as well as long term incontinence,and risk of DVT/PE, stroke, MI, PE, or even death. Benefits outweigh risks with his numerous ER visits/hospitalizations, and dropping hct.  Jacob Co, MD 11/13/2021

## 2021-11-13 NOTE — Progress Notes (Signed)
Urology Inpatient Progress Note  Subjective: No acute events overnight. He is afebrile, VSS. Hemoglobin up today, 8.3.  WBC count stable, 15.8.  Creatinine up, 1.61. Foley catheter in place draining clear, yellow urine on slow drip CBI. He is asleep this morning and unable to contribute to HPI.  Anti-infectives: Anti-infectives (From admission, onward)    Start     Dose/Rate Route Frequency Ordered Stop   11/13/21 0600  cefTRIAXone (ROCEPHIN) 1 g in sodium chloride 0.9 % 100 mL IVPB        1 g 200 mL/hr over 30 Minutes Intravenous Every 24 hours 11/12/21 1557     11/12/21 1500  ceFEPIme (MAXIPIME) 2 g in sodium chloride 0.9 % 100 mL IVPB  Status:  Discontinued        2 g 200 mL/hr over 30 Minutes Intravenous Every 12 hours 11/12/21 1415 11/12/21 1557      Current Facility-Administered Medications  Medication Dose Route Frequency Provider Last Rate Last Admin   acetaminophen (TYLENOL) suppository 650 mg  650 mg Rectal Q6H PRN Ivor Costa, MD       acetaminophen (TYLENOL) tablet 650 mg  650 mg Oral Q6H PRN Ivor Costa, MD       cefTRIAXone (ROCEPHIN) 1 g in sodium chloride 0.9 % 100 mL IVPB  1 g Intravenous Q24H Ivor Costa, MD   Stopped at 11/13/21 0555   Chlorhexidine Gluconate Cloth 2 % PADS 6 each  6 each Topical Daily Ivor Costa, MD   6 each at 11/12/21 2124   levETIRAcetam (KEPPRA) IVPB 500 mg/100 mL premix  500 mg Intravenous Huntley Dec, MD   Stopped at 11/13/21 0028   LORazepam (ATIVAN) injection 1 mg  1 mg Intravenous Q2H PRN Ivor Costa, MD       metoprolol tartrate (LOPRESSOR) injection 2.5 mg  2.5 mg Intravenous Q2H PRN Ivor Costa, MD       ondansetron [facility]) injection 4 mg  4 mg Intravenous Q8H PRN Ivor Costa, MD       sodium chloride 0.9 % bolus 500 mL  500 mL Intravenous Once Ward, Kristen N, DO   Held at 11/12/21 0730   sodium chloride irrigation 0.9 % 3,000 mL  3,000 mL Irrigation Continuous Mumma, Shannon, MD   3,000 mL at 11/12/21 1053   sodium chloride irrigation  0.9 % 3,000 mL  3,000 mL Irrigation Continuous Nickolas Madrid C, MD   3,000 mL at 11/12/21 1715   Objective: Vital signs in last 24 hours: Temp:  [97.5 F (36.4 C)-99 F (37.2 C)] 98.4 F (36.9 C) (11/09 0900) Pulse Rate:  [57-111] 80 (11/09 0900) Resp:  [11-33] 11 (11/09 0900) BP: (84-123)/(62-81) 101/68 (11/09 0900) SpO2:  [86 %-100 %] 100 % (11/09 0900) Weight:  [80.5 kg] 80.5 kg (11/08 1836)  Intake/Output from previous day: 11/08 0701 - 11/09 0700 In: [place] [IV Piggyback:200] Out: 8350 [Urine:7925; Stool:425] Intake/Output this shift: Total I/O In: -  Out: 1000 [Urine:1000]  Physical Exam Vitals and nursing note reviewed.  Constitutional:      General: He is sleeping. He is not in acute distress.    Appearance: He is not toxic-appearing or diaphoretic.  HENT:     Head: Normocephalic and atraumatic.  Pulmonary:     Effort: Pulmonary effort is normal. No respiratory distress.  Skin:    General: Skin is warm and dry.    Lab Results:  Recent Labs    11/13/21 0356 11/13/21 0500  WBC 15.5* 15.8*  HGB 7.9*  8.3*  HCT 25.2* 26.8*  PLT 210 213   BMET Recent Labs    11/12/21 0550 11/13/21 0356  NA 135 139  K 4.3 4.8  CL 108 116*  CO2 17* 20*  GLUCOSE 125* 110*  BUN 26* 33*  CREATININE 1.51* 1.61*  CALCIUM 8.7* 8.0*   PT/INR Recent Labs    11/12/21 0550  LABPROT 19.2*  INR 1.6*   ABG No results for input(s): "PHART", "HCO3" in the last 72 hours.  Invalid input(s): "PCO2", "PO2"  Studies/Results: US Pelvis Limited  Result Date: 11/12/2021 CLINICAL DATA:  Hematuria EXAM: LIMITED ULTRASOUND OF PELVIS TECHNIQUE: Limited transabdominal ultrasound examination of the pelvis was performed. COMPARISON:  None Available. FINDINGS: Balloon of the Foley catheter is visualized within the bladder. Redemonstrated is heterogeneous layering material which is slightly echogenic given history of hematuria could represent blood products. Other findings:  None.  IMPRESSION: Balloon of the Foley catheter is visualized within the bladder. Redemonstrated is heterogeneous layering material within the bladder. Given history of hematuria, this is favored to represent blood products. If there is concern for an underlying lesion, further evaluation with cystoscopy is recommended, when clinically appropriate. Electronically Signed   By: Marin Roberts M.D.   On: 11/12/2021 09:51   CT ABDOMEN PELVIS W CONTRAST  Result Date: 11/12/2021 CLINICAL DATA:  88 year old male with seizure-like activity, altered mental status, gross hematuria. Obstructing transverse colon mass in September. EXAM: CT ABDOMEN AND PELVIS WITH CONTRAST TECHNIQUE: Multidetector CT imaging of the abdomen and pelvis was performed using the standard protocol following bolus administration of intravenous contrast. RADIATION DOSE REDUCTION: This exam was performed according to the departmental dose-optimization program which includes automated exposure control, adjustment of the mA and/or kV according to patient size and/or use of iterative reconstruction technique. CONTRAST:  44m OMNIPAQUE IOHEXOL 300 MG/ML  SOLN COMPARISON:  Preoperative CT Abdomen and Pelvis 09/24/2021. CT Abdomen and Pelvis 12/26/2009. FINDINGS: Lower chest: Stable tortuous descending thoracic aorta with calcified atherosclerosis. Partially visible coronary artery calcified plaque or stents. No pericardial or pleural effusion. No significant cardiomegaly. Mild lung base scarring. Hepatobiliary: Small circumscribed low-density areas in the liver appear stable since September and too small to characterize but likely benign. Recommend routine cancer surveillance. Absent gallbladder. Pancreas: Negative. Spleen: Negative. Adrenals/Urinary Tract: Chronic left adrenal lipid rich adenoma is unchanged since 2011 and benign (no follow-up imaging recommended). right adrenal gland is normal. Bilateral simple fluid density renal cysts are also chronic and  benign (no follow-up imaging recommended). Symmetric renal contrast enhancement but new bilateral hydronephrosis and hydroureter. On delayed images there is no renal contrast excretion. Calcified renal artery atherosclerosis again noted. Abnormal urinary bladder is distended, contains fluid and gas, and also a relatively large 9.4 cm area of amorphous increased and mostly layering density. Malpositioned Foley catheter balloon is inflated in the prostatic urethra. Stomach/Bowel: New right abdominal colostomy and apparent resection of the abnormal transverse colon since September. There is a bowel staple line in the mid epigastrium now on series 2, image 17. Decompressed residual transverse colon in the left upper abdomen with decompressed descending colon, redundant sigmoid colon, and small volume retained stool in the rectum. Midline anterior abdominal wall postoperative changes are new along with right abdominal ostomy. No dilated large or small bowel. Small volume fluid and gas in the stomach and duodenum. No free air or free fluid identified. Vascular/Lymphatic: Aortoiliac calcified atherosclerosis. Major arterial structures in the abdomen and pelvis are patent. Tortuous but nonaneurysmal abdominal aorta. Diminutive portal venous  system with insufficient contrast on the initial images. On delayed images the portal venous system appears grossly patent. No lymphadenopathy identified. Reproductive: Marked prostatomegaly, and the Foley catheter balloon is inflated in the prostatic urethra today (series 2, image 74). Other: No pelvic free fluid. Musculoskeletal: Stable visualized osseous structures. Chronic spine degeneration. Chronic pubic symphysis degeneration. No acute or suspicious osseous lesion identified. IMPRESSION: 1. Malpositioned Foley catheter with balloon inflated in the prostatic urethra, underlying marked prostatomegaly. Superimposed distended urinary bladder containing a large 9 cm amorphous layering  density which is likely intravesical hematoma/hemorrhage. Subsequent bilateral hydronephrosis and hydroureter. 2. Abdominal surgery since September with apparent resection of the obstructing transverse colon mass. New right abdominal ostomy with no evidence of bowel obstruction or other adverse features. 3. No metastatic disease identified. Small indeterminate but probably benign liver lesions, recommend routine cancer surveillance imaging. 4.  Aortic Atherosclerosis (ICD10-I70.0). Electronically Signed   By: Genevie Ann M.D.   On: 11/12/2021 07:49   CT HEAD WO CONTRAST (5MM)  Result Date: 11/12/2021 CLINICAL DATA:  88 year old male with seizure-like activity, altered mental status, gross hematuria. EXAM: CT HEAD WITHOUT CONTRAST TECHNIQUE: Contiguous axial images were obtained from the base of the skull through the vertex without intravenous contrast. RADIATION DOSE REDUCTION: This exam was performed according to the departmental dose-optimization program which includes automated exposure control, adjustment of the mA and/or kV according to patient size and/or use of iterative reconstruction technique. COMPARISON:  Head CT 07/05/2018. FINDINGS: Brain: Cerebral volume has mildly decreased since 2020 but remains normal for age. No midline shift, ventriculomegaly, mass effect, evidence of mass lesion, intracranial hemorrhage or evidence of cortically based acute infarction. Patchy and confluent bilateral cerebral white matter hypodensity is stable but progressed, along with heterogeneity in the bilateral deep gray nuclei. No cortical encephalomalacia, brainstem and cerebellum appears stable and negative. Vascular: Calcified atherosclerosis at the skull base. No suspicious intracranial vascular hyperdensity. Skull: Stable and intact. Sinuses/Orbits: New low-density sinus fluid level or retention cyst in the left maxillary sinus with underlying chronic periosteal thickening there. But other Visualized paranasal sinuses  and mastoids are clear. Other: No orbit or scalp soft tissue injury identified. IMPRESSION: 1. No acute intracranial abnormality identified. 2. Evidence of progressed small vessel disease since 2020. 3. Mild acute on chronic left maxillary sinusitis. Electronically Signed   By: Genevie Ann M.D.   On: 11/12/2021 07:39    Assessment & Plan: 88 year old male with BPH with massive prostatomegaly and Foley dependent urinary retention with recurrent gross hematuria and clot retention recently admitted at [facility] with colon adenocarcinoma now s/p bowel resection now readmitted with gross hematuria and clot retention.  Urine efflux is clear yellow on slow drip CBI this morning.  We will continue with plans for HOLEP with Dr. Diamantina Providence this afternoon.  Please keep him n.p.o. in advance of procedure.  Please continue to hold anticoagulation.  Debroah Loop, PA-C 11/13/2021

## 2021-11-13 NOTE — Progress Notes (Addendum)
Neurology consulted on this patient for new onset seizure GTC x2 today. He is admitted for severe hematuria. He received 3g keppra and was started on '500mg'$  maint bid. rEEG showed no ongoing seizure activity. Patient agitated, unable to provide history. Will obtain history tomorrow from family and also perform MRI brain wwo when he is able to tolerate the procedure. Full consult note to follow.  Su Monks, MD [facility] [phone]  If 7pm- 7am, please page neurology on call as listed in [place].

## 2021-11-13 NOTE — Progress Notes (Signed)
PROGRESS NOTE    Jacob Avila  YSA:[id number] DOB: 1927/10/01 DOA: 11/12/2021 PCP: Jacob Billet, MD    Brief Narrative:  88 y.o. male with medical history significant of hypertension, hyperlipidemia, depression, atrial fibrillation on Eliquis, systolic CHF with a EF of 40-45%, indwelling Foley catheter due to BPH, CKD-3a, who presents with hematuria, seizure, altered mental status.   Per her daughter (I called her daughter by phone), patient has indwelling Foley catheter which was changed 1 week ago.  Patient was found to have blood in the catheter since yesterday.  Not sure if patient has symptoms of UTI. Per his daughter, patient had 2 episode of seizure-like activity. Patient is confused.  At her normal baseline, patient is orientated to person and place, but not always orientated to time. Now patient is altered, barely arousable, not oriented x3.  Patient moves all extremities on painful stimuli when I saw patient the ED. Per his daughter, patient has nausea and vomited twice and complains of abdominal pain.  Not sure if patient has diarrhea.  Patient does not seem to have chest pain, cough, shortness breath.  His daughter is not very sure when patient took his last dose of Eliquis, but possibly yesterday morning.   CT abdomen pelvis revealed malpositioned Foley catheter with balloon inflated in prostatic urethra.  Patient with significant hematuria.  Urology consulted from ED.  Placed on CBI.  Urine clearing up and hemoglobin stable as of 11/9.  Urology planning procedure on 11/9 in afternoon (HOLEP).  Neurology consulted for seizure-like activity.  Routine EEG negative.  Patient remains on IV Keppra.  11/9: He is lethargic on my evaluation and unable to participate in interview   Assessment & Plan:   Principal Problem:   Seizure ([place]) Active Problems:   Acute metabolic encephalopathy   Hematuria   BPH (benign prostatic hyperplasia)   Acute blood loss anemia   Atrial  fibrillation, chronic (HCC)   Chronic systolic CHF (congestive heart failure) (HCC)   HTN (hypertension)   HLD (hyperlipidemia)   Depression   Chronic indwelling Foley catheter  Seizure-like activity consulted Dr. Quinn Avila of neuro.  patient still has altered mental status, barely arousable.   CT head negative Spot EEG negative Plan: Seizure precautions Keppra 500 IV twice daily As needed Ativan for breakthrough seizure activity Seizure precautions  Acute metabolic encephalopathy  CT head is negative.  May be due to postictal. -Frequent neurochecks -Will hold all oral medications until mental status improves   Hematuria CT showed significant clot burden in the bladder and Foley catheter within the prostatic urethra.   Dr. Diamantina Avila of urology is consulted Plan: Continue empiric Rocephin Hold anticoagulation N.p.o. for urologic intervention 11/9 (HOLEP)  BPH -Hold Proscar and Flomax until mental status improves   Acute blood loss anemia  Hemoglobin dropped from 12.0-10.2. Has been relatively stable Currently no negation for transfusion    Chronic systolic CHF (congestive heart failure) ([place])  2D echo on 06/09/2018 showed EF of 40-45%.   Patient does not have leg edema JVD.   CHF seem to be compensated.  BNP 101. -Watch volume status closely   Atrial fibrillation, chronic (HCC) -Hold Eliquis due to hematuria -Hold oral metoprolol, amiodarone until mental status improved -As needed IV metoprolol 2.5 mg every 2 hours for heart rate> 125   HTN (hypertension)  Blood pressure is soft -Hold all blood pressure medications -monitoring Bp closely   HLD (hyperlipidemia) -Hold Lipitor   Depression -Hold Remeron  DVT prophylaxis: SCD Code  Status: DNR Family Communication: Daughter Jacob Avila [phone] on 11/9 Disposition Plan: Status is: Inpatient Remains inpatient appropriate because: Suspected new onset seizure, acute hematuria, need for urologic  procedure.   Level of care: Progressive  Consultants:  Neurology Urology  Procedures:  HOLEP planned 11/9  Antimicrobials: Ceftriaxone   Subjective: Seen and examined.  Lethargic.  Unable to participate in interview.  Objective: Vitals:   11/13/21 0800 11/13/21 0900 11/13/21 1000 11/13/21 1100  BP: (!) 84/68 101/68 109/83 (!) 109/59  Pulse:  80  94  Resp: '12 11 14 '$ (!) 21  Temp:  98.4 F (36.9 C)    TempSrc:  Axillary    SpO2:  100%  100%  Weight:      Height:        Intake/Output Summary (Last 24 hours) at 11/13/2021 1215 Last data filed at 11/13/2021 1100 Gross per 24 hour  Intake 10600 ml  Output 9350 ml  Net 1250 ml   Filed Weights   11/12/21 0538 11/12/21 1836  Weight: 80.5 kg 80.5 kg    Examination:  General exam: Sleepy, lethargic.  No visible distress Respiratory system: Lungs clear.  Normal work of breathing, room air Cardiovascular system: S1-S2, RRR, no murmurs, no pedal edema Gastrointestinal system: Soft, NT/ND, normal bowel sounds GU:+ Foley+, CBI Central nervous system: Lethargic.  Unable to assess orientation Extremities: Unable to assess Skin: No rashes, lesions or ulcers Psychiatry: Unable to assess    Data Reviewed: I have personally reviewed following labs and imaging studies  CBC: Recent Labs  Lab 11/12/21 0550 11/12/21 1432 11/13/21 0356 11/13/21 0500 11/13/21 1138  WBC 9.6 15.5* 15.5* 15.8* 14.6*  NEUTROABS 7.3  --   --   --   --   HGB 10.2* 9.4* 7.9* 8.3* 7.9*  HCT 33.3* 30.8* 25.2* 26.8* 25.1*  MCV 75.3* 76.2* 74.8* 74.9* 74.5*  PLT 205 231 210 213 329   Basic Metabolic Panel: Recent Labs  Lab 11/12/21 0550 11/13/21 0356  NA 135 139  K 4.3 4.8  CL 108 116*  CO2 17* 20*  GLUCOSE 125* 110*  BUN 26* 33*  CREATININE 1.51* 1.61*  CALCIUM 8.7* 8.0*   GFR: Estimated Creatinine Clearance: 32.8 mL/min (A) (by C-G formula based on SCr of 1.61 mg/dL (H)). Liver Function Tests: Recent Labs  Lab 11/12/21 0550   AST 19  ALT 17  ALKPHOS 81  BILITOT 0.5  PROT 7.0  ALBUMIN 2.9*   Recent Labs  Lab 11/12/21 0550  LIPASE 42   No results for input(s): "AMMONIA" in the last 168 hours. Coagulation Profile: Recent Labs  Lab 11/12/21 0550  INR 1.6*   Cardiac Enzymes: No results for input(s): "CKTOTAL", "CKMB", "CKMBINDEX", "TROPONINI" in the last 168 hours. BNP (last 3 results) No results for input(s): "PROBNP" in the last 8760 hours. HbA1C: No results for input(s): "HGBA1C" in the last 72 hours. CBG: Recent Labs  Lab 11/12/21 1130 11/12/21 1837 11/13/21 0836  GLUCAP 154* 111* 103*   Lipid Profile: No results for input(s): "CHOL", "HDL", "LDLCALC", "TRIG", "CHOLHDL", "LDLDIRECT" in the last 72 hours. Thyroid Function Tests: No results for input(s): "TSH", "T4TOTAL", "FREET4", "T3FREE", "THYROIDAB" in the last 72 hours. Anemia Panel: No results for input(s): "VITAMINB12", "FOLATE", "FERRITIN", "TIBC", "IRON", "RETICCTPCT" in the last 72 hours. Sepsis Labs: No results for input(s): "PROCALCITON", "LATICACIDVEN" in the last 168 hours.  Recent Results (from the past 240 hour(s))  Culture, blood (Routine X 2) w Reflex to ID Panel     Status:  None (Preliminary result)   Collection Time: 11/12/21  1:52 PM   Specimen: BLOOD  Result Value Ref Range Status   Specimen Description BLOOD LEFT ANTECUBITAL  Final   Special Requests   Final    BOTTLES DRAWN AEROBIC AND ANAEROBIC Blood Culture adequate volume   Culture   Final    NO GROWTH < 24 HOURS Performed at [facility], [address] [place], [place] [address]    Report Status PENDING  Incomplete  Culture, blood (Routine X 2) w Reflex to ID Panel     Status: None (Preliminary result)   Collection Time: 11/12/21  2:32 PM   Specimen: BLOOD  Result Value Ref Range Status   Specimen Description BLOOD RIGHT ANTECUBITAL  Final   Special Requests   Final    BOTTLES DRAWN AEROBIC AND ANAEROBIC Blood Culture results may not be  optimal due to an inadequate volume of blood received in culture bottles   Culture   Final    NO GROWTH < 24 HOURS Performed at [facility], [address] [place], [place] [address]    Report Status PENDING  Incomplete  MRSA Next Gen by PCR, Nasal     Status: None   Collection Time: 11/12/21  6:45 PM   Specimen: Nasal Mucosa; Nasal Swab  Result Value Ref Range Status   MRSA by PCR Next Gen NOT DETECTED NOT DETECTED Final    Comment: (NOTE) The GeneXpert MRSA Assay (FDA approved for NASAL specimens only), is one component of a comprehensive MRSA colonization surveillance program. It is not intended to diagnose MRSA infection nor to guide or monitor treatment for MRSA infections. Test performance is not FDA approved in patients less than 37 years old. Performed at [facility], [address] [place], [place] [address]          Radiology Studies: US Pelvis Limited  Result Date: 11/12/2021 CLINICAL DATA:  Hematuria EXAM: LIMITED ULTRASOUND OF PELVIS TECHNIQUE: Limited transabdominal ultrasound examination of the pelvis was performed. COMPARISON:  None Available. FINDINGS: Balloon of the Foley catheter is visualized within the bladder. Redemonstrated is heterogeneous layering material which is slightly echogenic given history of hematuria could represent blood products. Other findings:  None. IMPRESSION: Balloon of the Foley catheter is visualized within the bladder. Redemonstrated is heterogeneous layering material within the bladder. Given history of hematuria, this is favored to represent blood products. If there is concern for an underlying lesion, further evaluation with cystoscopy is recommended, when clinically appropriate. Electronically Signed   By: Marin Roberts M.D.   On: 11/12/2021 09:51   CT ABDOMEN PELVIS W CONTRAST  Result Date: 11/12/2021 CLINICAL DATA:  88 year old male with seizure-like activity, altered mental status, gross hematuria.  Obstructing transverse colon mass in September. EXAM: CT ABDOMEN AND PELVIS WITH CONTRAST TECHNIQUE: Multidetector CT imaging of the abdomen and pelvis was performed using the standard protocol following bolus administration of intravenous contrast. RADIATION DOSE REDUCTION: This exam was performed according to the departmental dose-optimization program which includes automated exposure control, adjustment of the mA and/or kV according to patient size and/or use of iterative reconstruction technique. CONTRAST:  77m OMNIPAQUE IOHEXOL 300 MG/ML  SOLN COMPARISON:  Preoperative CT Abdomen and Pelvis 09/24/2021. CT Abdomen and Pelvis 12/26/2009. FINDINGS: Lower chest: Stable tortuous descending thoracic aorta with calcified atherosclerosis. Partially visible coronary artery calcified plaque or stents. No pericardial or pleural effusion. No significant cardiomegaly. Mild lung base scarring. Hepatobiliary: Small circumscribed low-density areas in the liver appear stable  since September and too small to characterize but likely benign. Recommend routine cancer surveillance. Absent gallbladder. Pancreas: Negative. Spleen: Negative. Adrenals/Urinary Tract: Chronic left adrenal lipid rich adenoma is unchanged since 2011 and benign (no follow-up imaging recommended). right adrenal gland is normal. Bilateral simple fluid density renal cysts are also chronic and benign (no follow-up imaging recommended). Symmetric renal contrast enhancement but new bilateral hydronephrosis and hydroureter. On delayed images there is no renal contrast excretion. Calcified renal artery atherosclerosis again noted. Abnormal urinary bladder is distended, contains fluid and gas, and also a relatively large 9.4 cm area of amorphous increased and mostly layering density. Malpositioned Foley catheter balloon is inflated in the prostatic urethra. Stomach/Bowel: New right abdominal colostomy and apparent resection of the abnormal transverse colon since  September. There is a bowel staple line in the mid epigastrium now on series 2, image 17. Decompressed residual transverse colon in the left upper abdomen with decompressed descending colon, redundant sigmoid colon, and small volume retained stool in the rectum. Midline anterior abdominal wall postoperative changes are new along with right abdominal ostomy. No dilated large or small bowel. Small volume fluid and gas in the stomach and duodenum. No free air or free fluid identified. Vascular/Lymphatic: Aortoiliac calcified atherosclerosis. Major arterial structures in the abdomen and pelvis are patent. Tortuous but nonaneurysmal abdominal aorta. Diminutive portal venous system with insufficient contrast on the initial images. On delayed images the portal venous system appears grossly patent. No lymphadenopathy identified. Reproductive: Marked prostatomegaly, and the Foley catheter balloon is inflated in the prostatic urethra today (series 2, image 74). Other: No pelvic free fluid. Musculoskeletal: Stable visualized osseous structures. Chronic spine degeneration. Chronic pubic symphysis degeneration. No acute or suspicious osseous lesion identified. IMPRESSION: 1. Malpositioned Foley catheter with balloon inflated in the prostatic urethra, underlying marked prostatomegaly. Superimposed distended urinary bladder containing a large 9 cm amorphous layering density which is likely intravesical hematoma/hemorrhage. Subsequent bilateral hydronephrosis and hydroureter. 2. Abdominal surgery since September with apparent resection of the obstructing transverse colon mass. New right abdominal ostomy with no evidence of bowel obstruction or other adverse features. 3. No metastatic disease identified. Small indeterminate but probably benign liver lesions, recommend routine cancer surveillance imaging. 4.  Aortic Atherosclerosis (ICD10-I70.0). Electronically Signed   By: Genevie Ann M.D.   On: 11/12/2021 07:49   CT HEAD WO CONTRAST  (5MM)  Result Date: 11/12/2021 CLINICAL DATA:  88 year old male with seizure-like activity, altered mental status, gross hematuria. EXAM: CT HEAD WITHOUT CONTRAST TECHNIQUE: Contiguous axial images were obtained from the base of the skull through the vertex without intravenous contrast. RADIATION DOSE REDUCTION: This exam was performed according to the departmental dose-optimization program which includes automated exposure control, adjustment of the mA and/or kV according to patient size and/or use of iterative reconstruction technique. COMPARISON:  Head CT 07/05/2018. FINDINGS: Brain: Cerebral volume has mildly decreased since 2020 but remains normal for age. No midline shift, ventriculomegaly, mass effect, evidence of mass lesion, intracranial hemorrhage or evidence of cortically based acute infarction. Patchy and confluent bilateral cerebral white matter hypodensity is stable but progressed, along with heterogeneity in the bilateral deep gray nuclei. No cortical encephalomalacia, brainstem and cerebellum appears stable and negative. Vascular: Calcified atherosclerosis at the skull base. No suspicious intracranial vascular hyperdensity. Skull: Stable and intact. Sinuses/Orbits: New low-density sinus fluid level or retention cyst in the left maxillary sinus with underlying chronic periosteal thickening there. But other Visualized paranasal sinuses and mastoids are clear. Other: No orbit or scalp soft tissue injury  identified. IMPRESSION: 1. No acute intracranial abnormality identified. 2. Evidence of progressed small vessel disease since 2020. 3. Mild acute on chronic left maxillary sinusitis. Electronically Signed   By: Genevie Ann M.D.   On: 11/12/2021 07:39        Scheduled Meds:  Chlorhexidine Gluconate Cloth  6 each Topical Daily   Continuous Infusions:  cefTRIAXone (ROCEPHIN)  IV Stopped (11/13/21 0555)   levETIRAcetam 500 mg (11/13/21 1114)   sodium chloride Stopped (11/12/21 0730)   sodium  chloride irrigation     sodium chloride irrigation       LOS: 1 day      Sidney Ace, MD [facility]   If 7PM-7AM, please contact night-coverage  11/13/2021, 12:15 PM

## 2021-11-14 DIAGNOSIS — R569 Unspecified convulsions: Secondary | ICD-10-CM | POA: Diagnosis not present

## 2021-11-14 DIAGNOSIS — I482 Chronic atrial fibrillation, unspecified: Secondary | ICD-10-CM | POA: Diagnosis not present

## 2021-11-14 DIAGNOSIS — R319 Hematuria, unspecified: Secondary | ICD-10-CM | POA: Diagnosis not present

## 2021-11-14 DIAGNOSIS — D62 Acute posthemorrhagic anemia: Secondary | ICD-10-CM

## 2021-11-14 LAB — BASIC METABOLIC PANEL
Anion gap: 9 (ref 5–15)
BUN: 29 mg/dL — ABNORMAL HIGH (ref 8–23)
CO2: 11 mmol/L — ABNORMAL LOW (ref 22–32)
Calcium: 7 mg/dL — ABNORMAL LOW (ref 8.9–10.3)
Chloride: 120 mmol/L — ABNORMAL HIGH (ref 98–111)
Creatinine, Ser: 1.31 mg/dL — ABNORMAL HIGH (ref 0.61–1.24)
GFR, Estimated: 51 mL/min — ABNORMAL LOW (ref >60)
Glucose, Bld: 140 mg/dL — ABNORMAL HIGH (ref 70–99)
Potassium: 4.6 mmol/L (ref 3.5–5.1)
Sodium: 140 mmol/L (ref 135–145)

## 2021-11-14 LAB — CBC WITH DIFFERENTIAL/PLATELET
Abs Immature Granulocytes: 0.04 10*3/uL (ref 0.00–0.07)
Basophils Absolute: 0 10*3/uL (ref 0.0–0.1)
Basophils Relative: 0 %
Eosinophils Absolute: 0 10*3/uL (ref 0.0–0.5)
Eosinophils Relative: 0 %
HCT: 18.7 % — ABNORMAL LOW (ref 39.0–52.0)
Hemoglobin: 5.9 g/dL — ABNORMAL LOW (ref 13.0–17.0)
Immature Granulocytes: 0 %
Lymphocytes Relative: 4 %
Lymphs Abs: 0.3 10*3/uL — ABNORMAL LOW (ref 0.7–4.0)
MCH: 23.7 pg — ABNORMAL LOW (ref 26.0–34.0)
MCHC: 31.6 g/dL (ref 30.0–36.0)
MCV: 75.1 fL — ABNORMAL LOW (ref 80.0–100.0)
Monocytes Absolute: 0.1 10*3/uL (ref 0.1–1.0)
Monocytes Relative: 1 %
Neutro Abs: 8.4 10*3/uL — ABNORMAL HIGH (ref 1.7–7.7)
Neutrophils Relative %: 95 %
Platelets: 166 10*3/uL (ref 150–400)
RBC: 2.49 MIL/uL — ABNORMAL LOW (ref 4.22–5.81)
RDW: 16.7 % — ABNORMAL HIGH (ref 11.5–15.5)
WBC: 8.9 10*3/uL (ref 4.0–10.5)
nRBC: 0 % (ref 0.0–0.2)

## 2021-11-14 LAB — PREPARE RBC (CROSSMATCH)

## 2021-11-14 LAB — URINE CULTURE: Culture: NO GROWTH

## 2021-11-14 LAB — GLUCOSE, CAPILLARY
Glucose-Capillary: 130 mg/dL — ABNORMAL HIGH (ref 70–99)
Glucose-Capillary: 111 mg/dL — ABNORMAL HIGH (ref 70–99)
Glucose-Capillary: 119 mg/dL — ABNORMAL HIGH (ref 70–99)
Glucose-Capillary: 114 mg/dL — ABNORMAL HIGH (ref 70–99)

## 2021-11-14 LAB — ABO/RH: ABO/RH(D): O POS

## 2021-11-14 LAB — HEMOGLOBIN: Hemoglobin: 6.9 g/dL — ABNORMAL LOW (ref 13.0–17.0)

## 2021-11-14 MED ORDER — METOPROLOL SUCCINATE ER 25 MG PO TB24
25.0000 mg | ORAL_TABLET | Freq: Every day | ORAL | Status: DC
  Administered 2021-11-15: 25 mg via ORAL
  Filled 2021-11-14: qty 1

## 2021-11-14 MED ORDER — SODIUM CHLORIDE 0.9% IV SOLUTION: Freq: Once | INTRAVENOUS | Status: AC

## 2021-11-14 MED ORDER — SODIUM CHLORIDE 0.9 % IV SOLN: INTRAVENOUS | Status: DC

## 2021-11-14 NOTE — Care Management Important Message (Signed)
Important Message  Patient Details  Name: Jacob Avila MRN: [id number] Date of Birth: 1927/11/14   Medicare Important Message Given:  N/A - LOS <3 / Initial given by admissions     [name] 11/14/2021, 10:56 AM

## 2021-11-14 NOTE — NC FL2 (Signed)
[place] LEVEL OF CARE SCREENING TOOL     IDENTIFICATION  Patient Name: Jacob Avila Birthdate: 01/06/1927 Sex: male Admission Date (Current Location): 11/12/2021  [facility] and [place] Number:  Engineering geologist and Address:         Provider Number: [id number]  Attending Physician Name and Address:  Annita Brod, MD  Relative Name and Phone Number:  [name] [phone]    Current Level of Care: Hospital Recommended Level of Care: [place] Prior Approval Number:    Date Approved/Denied:   PASRR Number: [id number]  Discharge Plan: SNF    Current Diagnoses: Patient Active Problem List   Diagnosis Date Noted   Seizure ([place]) 11/12/2021   Atrial fibrillation, chronic ([place]) 43/15/4008   Chronic systolic CHF (congestive heart failure) ([place]) 11/12/2021   HTN (hypertension) 11/12/2021   Depression 11/12/2021   Hematuria 67/61/9509   Acute metabolic encephalopathy 32/67/1245   Acute blood loss anemia 11/12/2021   BPH (benign prostatic hyperplasia) 11/12/2021   Colonic mass 09/24/2021   Chronic indwelling Foley catheter 09/24/2021   Syncope 06/08/2018   Atrial fibrillation ([place]) 11/30/2016   CHF (congestive heart failure) ([place]) 11/03/2016   Dyspnea on exertion 10/23/2016   Arteriosclerosis of coronary artery 11/12/2014   1st degree AV block 05/13/2012   Bradycardia 05/12/2012   Dizziness 05/12/2012   HLD (hyperlipidemia) 05/12/2012   BP (high blood pressure) 05/12/2012   History of cholecystectomy 05/12/2012    Orientation RESPIRATION BLADDER Height & Weight     Self, Time, Situation, Place  Normal Incontinent, External catheter Weight: 177 lb 7.5 oz (80.5 kg) Height:  6' (182.9 cm)  BEHAVIORAL SYMPTOMS/MOOD NEUROLOGICAL BOWEL NUTRITION STATUS      Incontinent Diet (see discharge summary)  AMBULATORY STATUS COMMUNICATION OF NEEDS Skin   Extensive Assist Verbally Other (Comment) (closed incision perineum)                        Personal Care Assistance Level of Assistance  Bathing, Feeding, Dressing, Total care Bathing Assistance: Limited assistance Feeding assistance: Independent Dressing Assistance: Limited assistance Total Care Assistance: Limited assistance   Functional Limitations Info  Sight, Hearing, Speech Sight Info: Adequate Hearing Info: Adequate Speech Info: Adequate    SPECIAL CARE FACTORS FREQUENCY  PT (By licensed PT), OT (By licensed OT)     PT Frequency: min 4x's/weekly OT Frequency: min 4x's/weekly            Contractures Contractures Info: Not present    Additional Factors Info  Code Status, Allergies Code Status Info: DNR Allergies Info: nka           Current Medications (11/14/2021):  This is the current hospital active medication list Current Facility-Administered Medications  Medication Dose Route Frequency Provider Last Rate Last Admin   0.9 %  sodium chloride infusion   Intravenous Continuous Annita Brod, MD 75 mL/hr at 11/14/21 1205 New Bag at 11/14/21 1205   acetaminophen (TYLENOL) suppository 650 mg  650 mg Rectal Q6H PRN Billey Co, MD       acetaminophen (TYLENOL) tablet 650 mg  650 mg Oral Q6H PRN Billey Co, MD       amiodarone (PACERONE) tablet 200 mg  200 mg Oral Daily Sreenath, Sudheer B, MD       atorvastatin (LIPITOR) tablet 80 mg  80 mg Oral Daily Sreenath, Sudheer B, MD       cefTRIAXone (ROCEPHIN) 1 g in sodium chloride 0.9 %  100 mL IVPB  1 g Intravenous Q24H Billey Co, MD 200 mL/hr at 11/14/21 0519 1 g at 11/14/21 5027   Chlorhexidine Gluconate Cloth 2 % PADS 6 each  6 each Topical Daily Billey Co, MD   6 each at 11/13/21 1243   finasteride (PROSCAR) tablet 5 mg  5 mg Oral Daily Ralene Muskrat B, MD       levETIRAcetam (KEPPRA) IVPB 500 mg/100 mL premix  500 mg Intravenous Q12H Billey Co, MD 400 mL/hr at 11/14/21 1206 500 mg at 11/14/21 1206   LORazepam (ATIVAN) injection 1 mg  1 mg  Intravenous Q2H PRN Billey Co, MD       metoprolol succinate (TOPROL-XL) 24 hr tablet 25 mg  25 mg Oral Daily Sreenath, Sudheer B, MD       metoprolol tartrate (LOPRESSOR) injection 2.5 mg  2.5 mg Intravenous Q2H PRN Billey Co, MD       nitroGLYCERIN (NITROSTAT) SL tablet 0.4 mg  0.4 mg Sublingual Q5 min PRN Ralene Muskrat B, MD       ondansetron (ZOFRAN) injection 4 mg  4 mg Intravenous Q8H PRN Nickolas Madrid C, MD       sodium chloride 0.9 % bolus 500 mL  500 mL Intravenous Once Billey Co, MD   Held at 11/12/21 0730   sodium chloride irrigation 0.9 % 3,000 mL  3,000 mL Irrigation Continuous Nickolas Madrid C, MD   3,000 mL at 11/12/21 1053   sodium chloride irrigation 0.9 % 3,000 mL  3,000 mL Irrigation Continuous Billey Co, MD   3,000 mL at 11/13/21 1239   tamsulosin (FLOMAX) capsule 0.4 mg  0.4 mg Oral QPC supper Sidney Ace, MD         Discharge Medications: Please see discharge summary for a list of discharge medications.  Relevant Imaging Results:  Relevant Lab Results:   Additional Information SSN: [id number]  Judeen Hammans Linzy Darling, LCSW

## 2021-11-14 NOTE — TOC Initial Note (Signed)
Transition of Care [facility]) - Initial/Assessment Note    Patient Details  Name: Jacob Avila MRN: [id number] Date of Birth: 1927/09/25  [facility]) CM/SW Contact:    Tiburcio Bash, LCSW Phone Number: 11/14/2021, 12:16 PM  Clinical Narrative:                  CSW met with patient and daughter [name] at bedside. They confirm patient is from [facility], would like to return. CSW has requested PT orders for evals, in order for patient to get insurance auth to return to [facility].  Expected Discharge Plan: Skilled Nursing Facility Barriers to Discharge: Continued Medical Work up   Patient Goals and CMS Choice Patient states their goals for this hospitalization and ongoing recovery are:: to go home CMS Medicare.gov Compare Post Acute Care list provided to:: Patient Choice offered to / list presented to : Patient  Expected Discharge Plan and Services Expected Discharge Plan: [place]       Living arrangements for the past 2 months: Single Family Home                                      Prior Living Arrangements/Services Living arrangements for the past 2 months: Single Family Home Lives with:: Self                   Activities of Daily Living Home Assistive Devices/Equipment: Wheelchair, Environmental consultant (specify type), Dentures (specify type) ADL Screening (condition at time of admission) Patient's cognitive ability adequate to safely complete daily activities?: No Is the patient deaf or have difficulty hearing?: No Does the patient have difficulty seeing, even when wearing glasses/contacts?: No Does the patient have difficulty concentrating, remembering, or making decisions?: Yes Patient able to express need for assistance with ADLs?: No Does the patient have difficulty dressing or bathing?: Yes Independently performs ADLs?: No Communication: Independent Dressing (OT): Needs assistance Is this a change from baseline?: Pre-admission  baseline Grooming: Needs assistance Is this a change from baseline?: Pre-admission baseline Feeding: Independent Bathing: Dependent Is this a change from baseline?: Pre-admission baseline Toileting: Dependent Is this a change from baseline?: Pre-admission baseline In/Out Bed: Needs assistance Is this a change from baseline?: Pre-admission baseline Walks in Home: Needs assistance Is this a change from baseline?: Pre-admission baseline Does the patient have difficulty walking or climbing stairs?: Yes Weakness of Legs: Both Weakness of Arms/Hands: Both  Permission Sought/Granted                  Emotional Assessment       Orientation: : Oriented to Self, Oriented to Place, Oriented to  Time, Oriented to Situation Alcohol / Substance Use: Not Applicable Psych Involvement: No (comment)  Admission diagnosis:  Seizure ([place]) [R56.9] Gross hematuria [R31.0] Seizure-like activity ([place]) [R56.9] Patient Active Problem List   Diagnosis Date Noted   Seizure ([place]) 11/12/2021   Atrial fibrillation, chronic ([place]) 45/03/8880   Chronic systolic CHF (congestive heart failure) ([place]) 11/12/2021   HTN (hypertension) 11/12/2021   Depression 11/12/2021   Hematuria 80/03/4915   Acute metabolic encephalopathy 91/50/5697   Acute blood loss anemia 11/12/2021   BPH (benign prostatic hyperplasia) 11/12/2021   Colonic mass 09/24/2021   Chronic indwelling Foley catheter 09/24/2021   Syncope 06/08/2018   Atrial fibrillation ([place]) 11/30/2016   CHF (congestive heart failure) ([place]) 11/03/2016   Dyspnea on exertion 10/23/2016   Arteriosclerosis  of coronary artery 11/12/2014   1st degree AV block 05/13/2012   Bradycardia 05/12/2012   Dizziness 05/12/2012   HLD (hyperlipidemia) 05/12/2012   BP (high blood pressure) 05/12/2012   History of cholecystectomy 05/12/2012   PCP:  Albina Billet, MD Pharmacy:   [facility] [place], [place] - [address] [place] [address] Phone: [phone] Fax: [phone]     Social Determinants of Health (SDOH) Interventions    Readmission Risk Interventions     No data to display

## 2021-11-14 NOTE — Progress Notes (Signed)
   Subjective Urine crystal-clear this morning on slow CBI He denies any pain, appears alert, oriented, with no complaints  Physical Exam: BP 101/67 (BP Location: Left Arm)   Pulse 83   Temp (!) 97.4 F (36.3 C) (Oral)   Resp 18   Ht 6' (1.829 m)   Wt 80.5 kg   SpO2 96%   BMI 24.07 kg/m    Constitutional:  Alert and oriented, No acute distress. Respiratory: Normal respiratory effort, no increased work of breathing. GI: Abdomen is soft, non-tender, non-distended Foley: Urine crystal clear on slow CBI  Laboratory Data: Reviewed Hematocrit 18.7 from 23 last night  Assessment & Plan:   Comorbid 88 year old male with BPH and chronic Foley retention with multiple hospitalizations for gross hematuria, admitted with dropping hematocrit and opted for HOLEP 11/14/21.  Urine crystal clear on slow CBI POD#1 and CBI stopped, will recheck later this morning and consider Foley removal.  -CBI stopped -Recommend transfusion based on hematocrit of 18.7 this morning -Continue to hold anticoagulation, anticipate holding at least 3 to 4 days -Urology will manage Foley and recheck this morning, potential Foley removal later this morning  Billey Co, MD

## 2021-11-14 NOTE — Progress Notes (Signed)
PROGRESS NOTE    Jacob Avila  VFI:[id number] DOB: July 24, 1927 DOA: 11/12/2021 PCP: Albina Billet, MD    Brief Narrative:  88 y.o. male with medical history significant of hypertension, hyperlipidemia, depression, atrial fibrillation on Eliquis, systolic CHF with a EF of 40-45%, indwelling Foley catheter due to BPH (last changed 1 week prior), CKD-3a, who presented to the emergency department on 11/8 with hematuria, seizure, altered mental status.  According to patient's daughter, patient had 2 episodes of seizure-like activity on day of presentation to the ED.  On admission, patient appeared to be altered and barely arousable.  Neurology consulted and patient started on IV Keppra.  Routine EEG negative.  By 11/9, patient much more awake and alert.   CT abdomen pelvis revealed malpositioned Foley catheter with balloon inflated in prostatic urethra and patient noted to have significant hematuria.  Patient seen by urology and started on bladder irrigation.  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  Patient tolerated procedure well, however in PACU, patient went into rapid atrial fibrillation and started on p.o. amiodarone and metoprolol.  Patient's hemoglobin which was lower than baseline on admission, continued to trend downward and by 11/10, down to 5.9.  Transfused 2 units packed red blood cells.   Assessment & Plan:   Principal Problem:   Seizure ([place]) Active Problems:   Acute metabolic encephalopathy   Hematuria   BPH (benign prostatic hyperplasia)   Acute blood loss anemia   Atrial fibrillation, chronic (HCC)   Chronic systolic CHF (congestive heart failure) (H[facility])   HTN (hypertension)   HLD (hyperlipidemia)   Depression   Chronic indwelling Foley catheter  Seizure-like activity Neurology consulted.  CT and spot EEG negative.  On IV Keppra plus as needed Ativan.  Seizure precautions.  Neurology planning to get MRI this weekend as  patient's renal function improves.    Acute metabolic encephalopathy  CT head is negative.  May be due to postictal.  Patient's mentation much improved by 11/10.  Unclear etiology.   Hematuria CT showed significant clot burden in the bladder and Foley catheter within the prostatic urethra.  Anticoagulation on hold.  Status post HOLEP.  BPH -Hold Proscar and Flomax until mental status improves   Acute blood loss anemia Baseline hemoglobin around 12 and on admission at 10.  Has been trending downward and by 11/10 down to 5.9.  Transfused 2 units packed red blood cells.  Recheck labs in the morning.  This is secondary to his hematuria which looks to be significantly improving with CBI and HoLEP.    Chronic systolic CHF (congestive heart failure) ([place])  2D echo on 06/09/2018 showed EF of 40-45%.   Patient does not have leg edema JVD.   CHF seem to be compensated.  BNP 101. Recheck BNP in the morning following blood transfusion today   Atrial fibrillation, chronic (HCC) -Hold Eliquis due to hematuria -Hold oral metoprolol, amiodarone until mental status improved -As needed IV metoprolol 2.5 mg every 2 hours for heart rate> 125   HTN (hypertension)  Blood pressure is soft, secondary to blood loss anemia -Hold all blood pressure medications -monitoring Bp closely   HLD (hyperlipidemia) -Hold Lipitor   Depression -Hold Remeron  AKI in the setting of stage IIIa chronic kidney disease: Secondary to acute blood loss anemia.  With fluids, improved and expect will further improve following blood transfusion.  DVT prophylaxis: SCD Code Status: DNR Family Communication: Daughter updated at bedside Disposition Plan: Status  is: Inpatient Remains inpatient appropriate because:  -Stabilization of hemoglobin -Clearance by urology -Neurology   Level of care: Progressive  Consultants:  Neurology Urology  Procedures:  HOLEP 11/9 Transfusion 2 unit packed red blood cells  11/10  Antimicrobials: Ceftriaxone 11/9-11/11   Subjective: Oriented x2, no acute distress  Objective: Vitals:   11/14/21 1554 11/14/21 1609 11/14/21 1611 11/14/21 1624  BP: (!) 95/57 92/60 (!) 91/54 (!) 91/54  Pulse: 68 66 62 60  Resp: (!) 21 (!) 23  17  Temp: 98.1 F (36.7 C) 98.6 F (37 C) 98 F (36.7 C) 98.1 F (36.7 C)  TempSrc: Oral Oral Oral Oral  SpO2: 95% 99% 100% 100%  Weight:      Height:        Intake/Output Summary (Last 24 hours) at 11/14/2021 1759 Last data filed at 11/14/2021 1556 Gross per 24 hour  Intake 18925.75 ml  Output 19860 ml  Net -934.25 ml    Filed Weights   11/12/21 0538 11/12/21 1836 11/13/21 1318  Weight: 80.5 kg 80.5 kg 80.5 kg    Examination:  General exam: Alert, interactive and appropriate Respiratory system: Clear to auscultation bilaterally Cardiovascular system: Irregular rhythm, rate controlled Gastrointestinal system: Soft, nontender, nondistended, positive bowel sounds GU:+ Foley+, urine notes hematuria, however starting to become more clear Central nervous system: No focal deficits Extremities: No clubbing or cyanosis, trace pitting edema Skin: No rashes, lesions or ulcers Psychiatry: Appropriate, no evidence of psychoses    Data Reviewed: I have personally reviewed following labs and imaging studies Hemoglobin 5.9.  White blood cell count normalized, renal function slightly improved CBC: Recent Labs  Lab 11/12/21 0550 11/12/21 1432 11/13/21 0356 11/13/21 0500 11/13/21 1138 11/13/21 1809 11/14/21 0638  WBC 9.6   < > 15.5* 15.8* 14.6* 17.3* 8.9  NEUTROABS 7.3  --   --   --   --   --  8.4*  HGB 10.2*   < > 7.9* 8.3* 7.9* 7.1* 5.9*  HCT 33.3*   < > 25.2* 26.8* 25.1* 22.7* 18.7*  MCV 75.3*   < > 74.8* 74.9* 74.5* 75.7* 75.1*  PLT 205   < > 210 213 192 172 166   < > = values in this interval not displayed.    Basic Metabolic Panel: Recent Labs  Lab 11/12/21 0550 11/13/21 0356 11/14/21 0422  NA 135 139  140  K 4.3 4.8 4.6  CL 108 116* 120*  CO2 17* 20* 11*  GLUCOSE 125* 110* 140*  BUN 26* 33* 29*  CREATININE 1.51* 1.61* 1.31*  CALCIUM 8.7* 8.0* 7.0*    GFR: Estimated Creatinine Clearance: 40.3 mL/min (A) (by C-G formula based on SCr of 1.31 mg/dL (H)). Liver Function Tests: Recent Labs  Lab 11/12/21 0550  AST 19  ALT 17  ALKPHOS 81  BILITOT 0.5  PROT 7.0  ALBUMIN 2.9*    Recent Labs  Lab 11/12/21 0550  LIPASE 42    No results for input(s): "AMMONIA" in the last 168 hours. Coagulation Profile: Recent Labs  Lab 11/12/21 0550  INR 1.6*    Cardiac Enzymes: No results for input(s): "CKTOTAL", "CKMB", "CKMBINDEX", "TROPONINI" in the last 168 hours. BNP (last 3 results) No results for input(s): "PROBNP" in the last 8760 hours. HbA1C: No results for input(s): "HGBA1C" in the last 72 hours. CBG: Recent Labs  Lab 11/12/21 1837 11/13/21 0836 11/14/21 0751 11/14/21 1201 11/14/21 1627  GLUCAP 111* 103* 130* 111* 119*    Lipid Profile: No results for input(s): "  CHOL", "HDL", "LDLCALC", "TRIG", "CHOLHDL", "LDLDIRECT" in the last 72 hours. Thyroid Function Tests: No results for input(s): "TSH", "T4TOTAL", "FREET4", "T3FREE", "THYROIDAB" in the last 72 hours. Anemia Panel: No results for input(s): "VITAMINB12", "FOLATE", "FERRITIN", "TIBC", "IRON", "RETICCTPCT" in the last 72 hours. Sepsis Labs: No results for input(s): "PROCALCITON", "LATICACIDVEN" in the last 168 hours.  Recent Results (from the past 240 hour(s))  Culture, blood (Routine X 2) w Reflex to ID Panel     Status: None (Preliminary result)   Collection Time: 11/12/21  1:52 PM   Specimen: BLOOD  Result Value Ref Range Status   Specimen Description BLOOD LEFT ANTECUBITAL  Final   Special Requests   Final    BOTTLES DRAWN AEROBIC AND ANAEROBIC Blood Culture adequate volume   Culture   Final    NO GROWTH 2 DAYS Performed at [facility], [address], [place], [place] [address]     Report Status PENDING  Incomplete  Culture, blood (Routine X 2) w Reflex to ID Panel     Status: None (Preliminary result)   Collection Time: 11/12/21  2:32 PM   Specimen: BLOOD  Result Value Ref Range Status   Specimen Description BLOOD RIGHT ANTECUBITAL  Final   Special Requests   Final    BOTTLES DRAWN AEROBIC AND ANAEROBIC Blood Culture results may not be optimal due to an inadequate volume of blood received in culture bottles   Culture   Final    NO GROWTH 2 DAYS Performed at [facility], [address], [place], [place] [address]    Report Status PENDING  Incomplete  MRSA Next Gen by PCR, Nasal     Status: None   Collection Time: 11/12/21  6:45 PM   Specimen: Nasal Mucosa; Nasal Swab  Result Value Ref Range Status   MRSA by PCR Next Gen NOT DETECTED NOT DETECTED Final    Comment: (NOTE) The GeneXpert MRSA Assay (FDA approved for NASAL specimens only), is one component of a comprehensive MRSA colonization surveillance program. It is not intended to diagnose MRSA infection nor to guide or monitor treatment for MRSA infections. Test performance is not FDA approved in patients less than 24 years old. Performed at [facility], [address], [place], [place] [address]   Urine Culture     Status: None   Collection Time: 11/12/21 10:11 PM   Specimen: Urine, Random  Result Value Ref Range Status   Specimen Description   Final    URINE, RANDOM Performed at [facility], [address] [place], [place] [address]    Special Requests   Final    NONE Performed at [facility], [address] [place], [place] [address]    Culture   Final    NO GROWTH Performed at [facility], [place] [address], [place],  [address]    Report Status 11/14/2021 FINAL  Final         Radiology Studies: No results found.      Scheduled Meds:  amiodarone  200 mg Oral Daily   atorvastatin  80 mg Oral Daily   Chlorhexidine Gluconate  Cloth  6 each Topical Daily   finasteride  5 mg Oral Daily   [START ON 11/15/2021] metoprolol succinate  25 mg Oral Daily   tamsulosin  0.4 mg Oral QPC supper   Continuous Infusions:  sodium chloride 75 mL/hr at 11/14/21 1205   cefTRIAXone (ROCEPHIN)  IV 1 g (11/14/21 0519)   levETIRAcetam  500 mg (11/14/21 1206)   sodium chloride Stopped (11/12/21 0730)   sodium chloride irrigation     sodium chloride irrigation       LOS: 2 days      Annita Brod, MD [facility]   If 7PM-7AM, please contact night-coverage  11/14/2021, 5:59 PM

## 2021-11-14 NOTE — Progress Notes (Signed)
I reevaluated him at mid-morning. Urine is clear pink off CBI with no clots. He denies pain or other concerns. Will keep CBI off and keep Foley in place with tentative plans for removal tomorrow.  Debroah Loop, PA-C

## 2021-11-15 ENCOUNTER — Inpatient Hospital Stay: Payer: PPO

## 2021-11-15 DIAGNOSIS — R569 Unspecified convulsions: Secondary | ICD-10-CM | POA: Diagnosis not present

## 2021-11-15 LAB — BLOOD GAS, VENOUS
Acid-base deficit: 7.6 mmol/L — ABNORMAL HIGH (ref 0.0–2.0)
Bicarbonate: 16.2 mmol/L — ABNORMAL LOW (ref 20.0–28.0)
O2 Saturation: 93.5 %
Patient temperature: 37
pCO2, Ven: 28 mmHg — ABNORMAL LOW (ref 44–60)
pH, Ven: 7.37 (ref 7.25–7.43)
pO2, Ven: 62 mmHg — ABNORMAL HIGH (ref 32–45)

## 2021-11-15 LAB — BPAM RBC
Blood Product Expiration Date: 202312112359
Blood Product Expiration Date: 202312112359
Blood Product Expiration Date: 202312142359
ISSUE DATE / TIME: 202311101041
ISSUE DATE / TIME: 202311101548
ISSUE DATE / TIME: 202311102246
Unit Type and Rh: 5100
Unit Type and Rh: 5100
Unit Type and Rh: 5100

## 2021-11-15 LAB — TYPE AND SCREEN
ABO/RH(D): O POS
ABO/RH(D): O POS
Antibody Screen: NEGATIVE
Antibody Screen: NEGATIVE
Unit division: 0
Unit division: 0
Unit division: 0

## 2021-11-15 LAB — BASIC METABOLIC PANEL
Anion gap: 3 — ABNORMAL LOW (ref 5–15)
BUN: 23 mg/dL (ref 8–23)
CO2: 16 mmol/L — ABNORMAL LOW (ref 22–32)
Calcium: 6.9 mg/dL — ABNORMAL LOW (ref 8.9–10.3)
Chloride: 120 mmol/L — ABNORMAL HIGH (ref 98–111)
Creatinine, Ser: 1.19 mg/dL (ref 0.61–1.24)
GFR, Estimated: 58 mL/min — ABNORMAL LOW (ref >60)
Glucose, Bld: 132 mg/dL — ABNORMAL HIGH (ref 70–99)
Potassium: 4.3 mmol/L (ref 3.5–5.1)
Sodium: 139 mmol/L (ref 135–145)

## 2021-11-15 LAB — GLUCOSE, CAPILLARY
Glucose-Capillary: 118 mg/dL — ABNORMAL HIGH (ref 70–99)
Glucose-Capillary: 137 mg/dL — ABNORMAL HIGH (ref 70–99)
Glucose-Capillary: 102 mg/dL — ABNORMAL HIGH (ref 70–99)

## 2021-11-15 LAB — HEMOGLOBIN: Hemoglobin: 8 g/dL — ABNORMAL LOW (ref 13.0–17.0)

## 2021-11-15 LAB — BRAIN NATRIURETIC PEPTIDE: B Natriuretic Peptide: 478 pg/mL — ABNORMAL HIGH (ref 0.0–100.0)

## 2021-11-15 MED ORDER — FUROSEMIDE 10 MG/ML IJ SOLN: 20.0000 mg | Freq: Once | INTRAMUSCULAR | Status: DC

## 2021-11-15 MED ORDER — GADOBUTROL 1 MMOL/ML IV SOLN
7.5000 mL | Freq: Once | INTRAVENOUS | Status: AC | PRN
  Administered 2021-11-15: 7.5 mL via INTRAVENOUS

## 2021-11-15 MED ORDER — METOPROLOL SUCCINATE ER 25 MG PO TB24
12.5000 mg | ORAL_TABLET | Freq: Every day | ORAL | Status: DC
  Administered 2021-11-18 – 2021-11-20 (×3): 12.5 mg via ORAL
  Filled 2021-11-15 (×4): qty 1

## 2021-11-15 NOTE — Progress Notes (Addendum)
       CROSS COVER NOTE  NAME: BRADLEY BOSTELMAN MRN: [id number] DOB : 1927/11/21  Additional Unit of PRBCs ordered and transfused for hemoglobin of 6.9. Post transfusion improved to 8.0 Bicarb 16 on chem panel VBG pending Kathlene Cote NP [facility]

## 2021-11-15 NOTE — Progress Notes (Signed)
S: Patient is feeling well, no neuro complaints. No further seizure activity since admission. Awaiting MRI.  O:  Vitals:   11/15/21 0734 11/15/21 1100  BP: (!) 93/57 112/69  Pulse: (!) 54 75  Resp: 16 20  Temp: (!) 97.3 F (36.3 C) 97.8 F (36.6 C)  SpO2: 98% 98%    Physical Exam Gen: A&Ox4, NAD HEENT: Atraumatic, normocephalic; oropharynx clear, tongue without atrophy or fasciculations. Resp: CTAB, normal work of breathing CV: RRR, extremities appear well-perfused. Abd: soft/NT/ND Extrem: Nml bulk; no cyanosis, clubbing, or edema.  Neuro: *MS: A&O x4. Follows multi-step commands.  *Speech: no dysarthria or aphasia, able to name and repeat. *CN:    I: Deferred   II,III: PERRLA, VFF by confrontation, optic discs not visualized 2/2 pupillary constriction   III,IV,VI: EOMI w/o nystagmus, no ptosis   V: Sensation intact from V1 to V3 to LT   VII: Eyelid closure was full.  Smile symmetric.   VIII: Hearing intact to voice   IX,X: Voice normal, palate elevates symmetrically    XI: SCM/trap 5/5 bilat   XII: Tongue protrudes midline, no atrophy or fasciculations  *Motor:   Normal bulk.  No tremor, rigidity or bradykinesia. No pronator drift.   Strength: Dlt Bic Tri WE WrF FgS Gr HF KnF KnE PlF DoF    Left '5 5 5 5 5 5 5 5 5 5 5 5    '$ Right '5 5 5 5 5 5 5 5 5 5 5 5   '$ *Sensory: Intact to light touch, pinprick, temperature vibration throughout. Symmetric. Propioception intact bilat.  No double-simultaneous extinction.  *Coordination:  Finger-to-nose, heel-to-shin, rapid alternating motions were intact. *Reflexes:  2+ and symmetric throughout without clonus; toes down-going bilat *Gait: deferred  rEEG: moderate diffuse slowing, no epileptiform activity  A/P: 88 yo admitted for hematuria with 2 GTCs day of admission and no prior hx seizure. rEEG no epileptiform abnl. MRI brain wwo pending. If no sig abnl on MRI / no seizure focus identified, OK to discharge on current dose of keppra  with outpatient neuro f/u (I will arrange).  Su Monks, MD [facility] [phone]  If 7pm- 7am, please page neurology on call as listed in [place].

## 2021-11-15 NOTE — Evaluation (Signed)
Physical Therapy Evaluation Patient Details Name: Jacob Avila MRN: [id number] DOB: November 27, 1927 Today's Date: 11/15/2021  History of Present Illness  presented to ER secondary to hematuria, seizure-like activity and AMS; admitted for management of acute metabolic encephalopathy and malpositioned foley (with clot burden), s/p HoLEP (11/13/21).  Hospital course also complicated by rapid afib and post-op blood-loss anemia.  Clinical Impression  Patient resting in bed upon arrival to session; alert and oriented to basic information, follows commands and eager for OOB/participation with session.  Endorses mild pain/soreness in abdomen (FACES 4/10).  Generally weak and deconditioned throughout all extremities, but does demonstrate mild asymmetry in weakness L LE > UE (patient reports difficulty with L LE started 'a couple weeks ago').  Denies sensory deficit.  Currently requiring min/mod assist for bed mobility; mod assist for sit/stand, static standing and lateral stepping edge of bed (5') with RW.  Demonstrates side-stepping at edge of bed, progressive flexion of bilat hips/knees in WBing; decreased loading/stance time L > R LE. Poor balance, very high risk for buckling. Recommend use of RW and +1 at all times (possible +2 with progressive gait distance). Additional gait/OOB efforts deferred, as transport en route for transfer to MRI. Of note, HR elevation to 120s with sit/stand and standing balance efforts; denies chest pain/pressure. Does return to 80-90s with seated rest periods. Would benefit from skilled PT to address above deficits and promote optimal return to PLOF.; recommend transition to STR upon discharge from acute hospitalization.        Recommendations for follow up therapy are one component of a multi-disciplinary discharge planning process, led by the attending physician.  Recommendations may be updated based on patient status, additional functional criteria and insurance  authorization.  Follow Up Recommendations Skilled nursing-short term rehab (<3 hours/day) Can patient physically be transported by private vehicle: Yes    Assistance Recommended at Discharge Frequent or constant Supervision/Assistance  Patient can return home with the following  A lot of help with walking and/or transfers;A lot of help with bathing/dressing/bathroom    Equipment Recommendations    Recommendations for Other Services       Functional Status Assessment Patient has had a recent decline in their functional status and demonstrates the ability to make significant improvements in function in a reasonable and predictable amount of time.     Precautions / Restrictions Precautions Precautions: Fall Restrictions Weight Bearing Restrictions: No      Mobility  Bed Mobility Overal bed mobility: Needs Assistance Bed Mobility: Supine to Sit, Sit to Supine     Supine to sit: Min assist, Mod assist Sit to supine: Min assist   General bed mobility comments: assist for L LE management and truncal elevation    Transfers Overall transfer level: Needs assistance Equipment used: Rolling walker (2 wheels) Transfers: Sit to/from Stand Sit to Stand: Mod assist           General transfer comment: assist/stabilization for L LE placement (tends to slide anterior to BOS at times); extensive assist for lift off and anterior weight translation.  consistent cuing for postural extension. Limited eccentric control with stand to sit.    Ambulation/Gait Ambulation/Gait assistance: Min assist Gait Distance (Feet): 5 Feet Assistive device: Rolling walker (2 wheels)         General Gait Details: side-stepping at edge of bed, progressive flexion of bilat hips/knees in WBing; decreased loading/stance time L > R LE.  Poor balance, very high risk for buckling.  Recommend use of RW and +1 at  all times (possible +2 with progressive gait distance)  Stairs            Wheelchair  Mobility    Modified Rankin (Stroke Patients Only)       Balance Overall balance assessment: Needs assistance Sitting-balance support: No upper extremity supported, Feet supported Sitting balance-Leahy Scale: Good       Standing balance-Leahy Scale: Poor                               Pertinent Vitals/Pain Pain Assessment Pain Assessment: Faces Faces Pain Scale: Hurts little more Pain Location: abdomen Pain Descriptors / Indicators: Aching, Guarding, Grimacing Pain Intervention(s): Limited activity within patient's tolerance, Monitored during session, Repositioned    Home Living Family/patient expects to be discharged to:: Skilled nursing facility                   Additional Comments: Per patient, at baseline, lives alone in 1-level home with single step to enter.  Ambulatory without assist device and denies fall history.  Has recently been at [facility] for rehab s/p abdominal surgery (with ostomy creation)    Prior Function Prior Level of Function : Independent/Modified Independent             Mobility Comments: At baseline, indep with ADLs, household and community mobilization; denies fall history.  Endorses participating with therapy at rehab, but "not enough"       Hand Dominance   Dominant Hand: Right    Extremity/Trunk Assessment   Upper Extremity Assessment Upper Extremity Assessment: Generalized weakness (R UE grossly 4+/5, L UE grossly 4/5)    Lower Extremity Assessment Lower Extremity Assessment: Generalized weakness (R LE grossly 4-/5, L LE grossly 3-/5.  Denies sensory deficit)       Communication   Communication: HOH  Cognition Arousal/Alertness: Awake/alert Behavior During Therapy: WFL for tasks assessed/performed Overall Cognitive Status: Within Functional Limits for tasks assessed                                          General Comments      Exercises Other Exercises Other Exercises: Mod assist  to don undergarments and pants (in both supine and sitting edge of bed positioning); educated in hemi-dressing technique due to L LE weakness. Mod assist for sit/stand and static standing balance with RW, mod assist to pull pants over hips.   Assessment/Plan    PT Assessment Patient needs continued PT services  PT Problem List Decreased strength;Decreased range of motion;Decreased activity tolerance;Decreased balance;Decreased mobility;Decreased knowledge of use of DME;Decreased safety awareness;Decreased knowledge of precautions;Cardiopulmonary status limiting activity       PT Treatment Interventions DME instruction;Gait training;Functional mobility training;Stair training;Therapeutic activities;Therapeutic exercise;Balance training;Cognitive remediation;Patient/family education    PT Goals (Current goals can be found in the Care Plan section)  Acute Rehab PT Goals Patient Stated Goal: to get stronger PT Goal Formulation: With patient Time For Goal Achievement: 11/29/21 Potential to Achieve Goals: Good    Frequency Min 2X/week     Co-evaluation               AM-PAC PT "6 Clicks" Mobility  Outcome Measure Help needed turning from your back to your side while in a flat bed without using bedrails?: A Little Help needed moving from lying on your back to sitting on the side  of a flat bed without using bedrails?: A Little Help needed moving to and from a bed to a chair (including a wheelchair)?: A Lot Help needed standing up from a chair using your arms (e.g., wheelchair or bedside chair)?: A Lot Help needed to walk in hospital room?: A Lot Help needed climbing 3-5 steps with a railing? : A Lot 6 Click Score: 14    End of Session Equipment Utilized During Treatment: Gait belt Activity Tolerance: Patient tolerated treatment well Patient left: in bed;with call bell/phone within reach;with bed alarm set Nurse Communication: Mobility status PT Visit Diagnosis: Muscle weakness  (generalized) (M62.81);Difficulty in walking, not elsewhere classified (R26.2)    Time: 1683-7290 PT Time Calculation (min) (ACUTE ONLY): 24 min   Charges:   PT Evaluation $PT Eval Moderate Complexity: 1 Mod         Patton Swisher H. Owens Shark, PT, DPT, NCS 11/15/21, 2:08 PM [phone]

## 2021-11-15 NOTE — Progress Notes (Addendum)
PROGRESS NOTE    Jacob Avila  GMW:[id number] DOB: 08/25/1927 DOA: 11/12/2021 PCP: Albina Billet, MD    Brief Narrative:  88 y.o. male with medical history significant of hypertension, hyperlipidemia, depression, atrial fibrillation on Eliquis, systolic CHF with a EF of 40-45%, indwelling Foley catheter due to BPH (last changed 1 week prior), CKD-3a, who presented to the emergency department on 11/8 with hematuria, seizure, altered mental status.  According to patient's daughter, patient had 2 episodes of seizure-like activity on day of presentation to the ED.  On admission, patient appeared to be altered and barely arousable.  Neurology consulted and patient started on IV Keppra.  Routine EEG negative.  By 11/9, patient much more awake and alert.   CT abdomen pelvis revealed malpositioned Foley catheter with balloon inflated in prostatic urethra and patient noted to have significant hematuria.  Patient seen by urology and started on bladder irrigation.  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  Patient tolerated procedure well, however in PACU, patient went into rapid atrial fibrillation and started on p.o. amiodarone and metoprolol.  Patient's hemoglobin which was lower than baseline on admission, continued to trend downward and by 11/10, down to 5.9.  Transfused PRBC (total of 3 units)   Assessment & Plan:   Principal Problem:   Seizure ([place]) Active Problems:   Acute metabolic encephalopathy   Hematuria   BPH (benign prostatic hyperplasia)   Acute blood loss anemia   Atrial fibrillation, chronic (HCC)   Chronic systolic CHF (congestive heart failure) (HCC)   HTN (hypertension)   HLD (hyperlipidemia)   Depression   Chronic indwelling Foley catheter  Seizure-like activity Neurology consulted.  CT and spot EEG negative.  On IV Keppra plus as needed Ativan.  Seizure precautions.  Neurology planning to get MRI this weekend as patient's  renal function improves.  Orders placed  Acute metabolic encephalopathy  CT head is negative.  May be due to postictal.  Patient's mentation much improved by 11/10.  Unclear etiology but appears to be at baseline   Hematuria CT showed significant clot burden in the bladder and Foley catheter within the prostatic urethra.  Anticoagulation on hold.  Status post HOLEP.  BPH -resume home meds   Acute blood loss anemia Baseline hemoglobin around 12 and on admission at 10.  Has been trending downward and by 11/10 down to 5.9.  Transfused 3 units packed red blood cells.   -secondary to his hematuria which looks to be significantly improving with CBI and HoLEP.    Chronic systolic CHF (congestive heart failure) ([place])  2D echo on 06/09/2018 showed EF of 40-45%.   -lasix x 1 if BP improves following 3rd transfusion   Atrial fibrillation, chronic (HCC) -Hold Eliquis due to hematuria Resume oral metoprolol, amiodarone  -As needed IV metoprolol 2.5 mg every 2 hours for heart rate> 125   HTN (hypertension)  Blood pressure is soft, secondary to blood loss anemia   HLD (hyperlipidemia)  Lipitor   Depression - Remeron  AKI in the setting of stage IIIa chronic kidney disease: Secondary to acute blood loss anemia.   -monitor with dose of IV lasix  DVT prophylaxis: SCD Code Status: DNR Family Communication: no family at bedside Disposition Plan: Status is: Inpatient Remains inpatient appropriate because:  From SNF?   Level of care: Progressive  Consultants:  Neurology Urology  Procedures:  HOLEP 11/9 Transfusion 2 unit packed red blood cells 11/10  Antimicrobials: Ceftriaxone 11/9-11/11  Subjective: Says he wants to be d/c'd home  Objective: Vitals:   11/15/21 0200 11/15/21 0439 11/15/21 0500 11/15/21 0734  BP: (!) 97/55  102/64 (!) 93/57  Pulse: (!) 58  60 (!) 54  Resp: '16  16 16  '$ Temp: (!) 97.5 F (36.4 C) 97.6 F (36.4 C)  (!) 97.3 F (36.3 C)  TempSrc: Oral  Axillary  Oral  SpO2: 100%  98% 98%  Weight:      Height:        Intake/Output Summary (Last 24 hours) at 11/15/2021 1047 Last data filed at 11/15/2021 5364 Gross per 24 hour  Intake 3246.25 ml  Output 1240 ml  Net 2006.25 ml   Filed Weights   11/12/21 0538 11/12/21 1836 11/13/21 1318  Weight: 80.5 kg 80.5 kg 80.5 kg    Examination:   General: Appearance:    Elderly male in no acute distress     Lungs:     respirations unlabored  Heart:    Bradycardic.   MS:   All extremities are intact.   Neurologic:   Awake, alert, pleasantly confused       Data Reviewed: I have personally reviewed following labs and imaging studies Hemoglobin 5.9.  White blood cell count normalized, renal function slightly improved CBC: Recent Labs  Lab 11/12/21 0550 11/12/21 1432 11/13/21 0356 11/13/21 0500 11/13/21 1138 11/13/21 1809 11/14/21 0638 11/14/21 2010 11/15/21 0315  WBC 9.6   < > 15.5* 15.8* 14.6* 17.3* 8.9  --   --   NEUTROABS 7.3  --   --   --   --   --  8.4*  --   --   HGB 10.2*   < > 7.9* 8.3* 7.9* 7.1* 5.9* 6.9* 8.0*  HCT 33.3*   < > 25.2* 26.8* 25.1* 22.7* 18.7*  --   --   MCV 75.3*   < > 74.8* 74.9* 74.5* 75.7* 75.1*  --   --   PLT 205   < > 210 213 192 172 166  --   --    < > = values in this interval not displayed.   Basic Metabolic Panel: Recent Labs  Lab 11/12/21 0550 11/13/21 0356 11/14/21 0422 11/15/21 0234  NA 135 139 140 139  K 4.3 4.8 4.6 4.3  CL 108 116* 120* 120*  CO2 17* 20* 11* 16*  GLUCOSE 125* 110* 140* 132*  BUN 26* 33* 29* 23  CREATININE 1.51* 1.61* 1.31* 1.19  CALCIUM 8.7* 8.0* 7.0* 6.9*   GFR: Estimated Creatinine Clearance: 44.4 mL/min (by C-G formula based on SCr of 1.19 mg/dL). Liver Function Tests: Recent Labs  Lab 11/12/21 0550  AST 19  ALT 17  ALKPHOS 81  BILITOT 0.5  PROT 7.0  ALBUMIN 2.9*   Recent Labs  Lab 11/12/21 0550  LIPASE 42   No results for input(s): "AMMONIA" in the last 168 hours. Coagulation  Profile: Recent Labs  Lab 11/12/21 0550  INR 1.6*   Cardiac Enzymes: No results for input(s): "CKTOTAL", "CKMB", "CKMBINDEX", "TROPONINI" in the last 168 hours. BNP (last 3 results) No results for input(s): "PROBNP" in the last 8760 hours. HbA1C: No results for input(s): "HGBA1C" in the last 72 hours. CBG: Recent Labs  Lab 11/14/21 0751 11/14/21 1201 11/14/21 1627 11/14/21 2109 11/15/21 0737  GLUCAP 130* 111* 119* 114* 118*   Lipid Profile: No results for input(s): "CHOL", "HDL", "LDLCALC", "TRIG", "CHOLHDL", "LDLDIRECT" in the last 72 hours. Thyroid Function Tests: No results for input(s): "TSH", "T4TOTAL", "  FREET4", "T3FREE", "THYROIDAB" in the last 72 hours. Anemia Panel: No results for input(s): "VITAMINB12", "FOLATE", "FERRITIN", "TIBC", "IRON", "RETICCTPCT" in the last 72 hours. Sepsis Labs: No results for input(s): "PROCALCITON", "LATICACIDVEN" in the last 168 hours.  Recent Results (from the past 240 hour(s))  Culture, blood (Routine X 2) w Reflex to ID Panel     Status: None (Preliminary result)   Collection Time: 11/12/21  1:52 PM   Specimen: BLOOD  Result Value Ref Range Status   Specimen Description BLOOD LEFT ANTECUBITAL  Final   Special Requests   Final    BOTTLES DRAWN AEROBIC AND ANAEROBIC Blood Culture adequate volume   Culture   Final    NO GROWTH 3 DAYS Performed at [facility], [address] [place], [place] [address]    Report Status PENDING  Incomplete  Culture, blood (Routine X 2) w Reflex to ID Panel     Status: None (Preliminary result)   Collection Time: 11/12/21  2:32 PM   Specimen: BLOOD  Result Value Ref Range Status   Specimen Description BLOOD RIGHT ANTECUBITAL  Final   Special Requests   Final    BOTTLES DRAWN AEROBIC AND ANAEROBIC Blood Culture results may not be optimal due to an inadequate volume of blood received in culture bottles   Culture   Final    NO GROWTH 3 DAYS Performed at [facility], [address] [place], [place] [address]    Report Status PENDING  Incomplete  MRSA Next Gen by PCR, Nasal     Status: None   Collection Time: 11/12/21  6:45 PM   Specimen: Nasal Mucosa; Nasal Swab  Result Value Ref Range Status   MRSA by PCR Next Gen NOT DETECTED NOT DETECTED Final    Comment: (NOTE) The GeneXpert MRSA Assay (FDA approved for NASAL specimens only), is one component of a comprehensive MRSA colonization surveillance program. It is not intended to diagnose MRSA infection nor to guide or monitor treatment for MRSA infections. Test performance is not FDA approved in patients less than 52 years old. Performed at [facility], [address], [place], [place] [address]   Urine Culture     Status: None   Collection Time: 11/12/21 10:11 PM   Specimen: Urine, Random  Result Value Ref Range Status   Specimen Description   Final    URINE, RANDOM Performed at [facility], [address] [place], [place] [address]    Special Requests   Final    NONE Performed at [facility], [address] [place], [place] [address]    Culture   Final    NO GROWTH Performed at [facility], [place] [address], [place], [place] [address]    Report Status 11/14/2021 FINAL  Final         Radiology Studies: No results found.      Scheduled Meds:  amiodarone  200 mg Oral Daily   atorvastatin  80 mg Oral Daily   Chlorhexidine Gluconate Cloth  6 each Topical Daily   finasteride  5 mg Oral Daily   metoprolol succinate  25 mg Oral Daily   tamsulosin  0.4 mg Oral QPC supper   Continuous Infusions:  sodium chloride 75 mL/hr at 11/14/21 2347   levETIRAcetam 500 mg (11/15/21 0946)   sodium chloride Stopped (11/12/21 0730)   sodium chloride irrigation     sodium chloride irrigation       LOS: 3 days  Geradine Girt, DO [facility]   If 7PM-7AM, please contact night-coverage  11/15/2021, 10:47 AM

## 2021-11-15 NOTE — Progress Notes (Signed)
2 Days Post-Op Subjective: Patient reports no pelvic pain. Urine very light pink this morning. No other complaints  Objective: Vital signs in last 24 hours: Temp:  [97.3 F (36.3 C)-98.8 F (37.1 C)] 97.3 F (36.3 C) (11/11 0734) Pulse Rate:  [54-74] 54 (11/11 0734) Resp:  [16-23] 16 (11/11 0734) BP: (85-102)/(53-64) 93/57 (11/11 0734) SpO2:  [95 %-100 %] 98 % (11/11 0734)  Intake/Output from previous day: 11/10 0701 - 11/11 0700 In: 3496.3 [P.O.:360; I.V.:1238.3; AVWUJ:8119; IV Piggyback:200] Out: 1250 [Urine:750; Stool:500] Intake/Output this shift: No intake/output data recorded.  Physical Exam:  General:alert, cooperative, and appears stated age GI: soft, non tender, normal bowel sounds, no palpable masses, no organomegaly, no inguinal hernia Male genitalia: not done Extremities: extremities normal, atraumatic, no cyanosis or edema  Lab Results: Recent Labs    11/13/21 1138 11/13/21 1809 11/14/21 0638 11/14/21 2010 11/15/21 0315  HGB 7.9* 7.1* 5.9* 6.9* 8.0*  HCT 25.1* 22.7* 18.7*  --   --    BMET Recent Labs    11/14/21 0422 11/15/21 0234  NA 140 139  K 4.6 4.3  CL 120* 120*  CO2 11* 16*  GLUCOSE 140* 132*  BUN 29* 23  CREATININE 1.31* 1.19  CALCIUM 7.0* 6.9*   No results for input(s): "LABPT", "INR" in the last 72 hours. No results for input(s): "LABURIN" in the last 72 hours. Results for orders placed or performed during the hospital encounter of 11/12/21  Culture, blood (Routine X 2) w Reflex to ID Panel     Status: None (Preliminary result)   Collection Time: 11/12/21  1:52 PM   Specimen: BLOOD  Result Value Ref Range Status   Specimen Description BLOOD LEFT ANTECUBITAL  Final   Special Requests   Final    BOTTLES DRAWN AEROBIC AND ANAEROBIC Blood Culture adequate volume   Culture   Final    NO GROWTH 3 DAYS Performed at [facility], [address], [place], [place] [address]    Report Status PENDING  Incomplete  Culture, blood  (Routine X 2) w Reflex to ID Panel     Status: None (Preliminary result)   Collection Time: 11/12/21  2:32 PM   Specimen: BLOOD  Result Value Ref Range Status   Specimen Description BLOOD RIGHT ANTECUBITAL  Final   Special Requests   Final    BOTTLES DRAWN AEROBIC AND ANAEROBIC Blood Culture results may not be optimal due to an inadequate volume of blood received in culture bottles   Culture   Final    NO GROWTH 3 DAYS Performed at [facility], [address] [place], [place] [address]    Report Status PENDING  Incomplete  MRSA Next Gen by PCR, Nasal     Status: None   Collection Time: 11/12/21  6:45 PM   Specimen: Nasal Mucosa; Nasal Swab  Result Value Ref Range Status   MRSA by PCR Next Gen NOT DETECTED NOT DETECTED Final    Comment: (NOTE) The GeneXpert MRSA Assay (FDA approved for NASAL specimens only), is one component of a comprehensive MRSA colonization surveillance program. It is not intended to diagnose MRSA infection nor to guide or monitor treatment for MRSA infections. Test performance is not FDA approved in patients less than 23 years old. Performed at [facility], [address], [place], [place] [address]   Urine Culture     Status: None   Collection Time: 11/12/21 10:11 PM   Specimen: Urine, Random  Result Value Ref Range Status  Specimen Description   Final    URINE, RANDOM Performed at [facility], [address] [place], [place] [address]    Special Requests   Final    NONE Performed at [facility], [address], [place], [place] [address]    Culture   Final    NO GROWTH Performed at [facility], [place] [address] [place],  [address]    Report Status 11/14/2021 FINAL  Final    Studies/Results: No results found.  Assessment/Plan: 88yo with BPH and gross hematuria Hematuria has resolved a voiding trial ordered for this morning. If patient passes his voiding trial and urine remains clear he  can be discharged home    LOS: 3 days   Nicolette Bang 11/15/2021, 10:06 AM

## 2021-11-16 DIAGNOSIS — R569 Unspecified convulsions: Secondary | ICD-10-CM | POA: Diagnosis not present

## 2021-11-16 LAB — CBC
HCT: 26.2 % — ABNORMAL LOW (ref 39.0–52.0)
Hemoglobin: 8.6 g/dL — ABNORMAL LOW (ref 13.0–17.0)
MCH: 25.3 pg — ABNORMAL LOW (ref 26.0–34.0)
MCHC: 32.8 g/dL (ref 30.0–36.0)
MCV: 77.1 fL — ABNORMAL LOW (ref 80.0–100.0)
Platelets: 156 10*3/uL (ref 150–400)
RBC: 3.4 MIL/uL — ABNORMAL LOW (ref 4.22–5.81)
RDW: 16.7 % — ABNORMAL HIGH (ref 11.5–15.5)
WBC: 8.8 10*3/uL (ref 4.0–10.5)
nRBC: 0 % (ref 0.0–0.2)

## 2021-11-16 LAB — BASIC METABOLIC PANEL
Anion gap: 1 — ABNORMAL LOW (ref 5–15)
BUN: 21 mg/dL (ref 8–23)
CO2: 18 mmol/L — ABNORMAL LOW (ref 22–32)
Calcium: 7 mg/dL — ABNORMAL LOW (ref 8.9–10.3)
Chloride: 120 mmol/L — ABNORMAL HIGH (ref 98–111)
Creatinine, Ser: 1.05 mg/dL (ref 0.61–1.24)
GFR, Estimated: >60 mL/min (ref >60)
Glucose, Bld: 91 mg/dL (ref 70–99)
Potassium: 3.9 mmol/L (ref 3.5–5.1)
Sodium: 139 mmol/L (ref 135–145)

## 2021-11-16 LAB — GLUCOSE, CAPILLARY
Glucose-Capillary: 84 mg/dL (ref 70–99)
Glucose-Capillary: 94 mg/dL (ref 70–99)
Glucose-Capillary: 88 mg/dL (ref 70–99)

## 2021-11-16 MED ORDER — LEVETIRACETAM 500 MG PO TABS
500.0000 mg | ORAL_TABLET | Freq: Two times a day (BID) | ORAL | Status: DC
  Administered 2021-11-16 – 2021-11-20 (×7): 500 mg via ORAL
  Filled 2021-11-16 (×8): qty 1

## 2021-11-16 NOTE — Progress Notes (Signed)
PROGRESS NOTE    Arthor NEAMIAH SCIARRA  JSH:[id number] DOB: 1927-07-08 DOA: 11/12/2021 PCP: Albina Billet, MD    Brief Narrative:  88 y.o. male with medical history significant of hypertension, hyperlipidemia, depression, atrial fibrillation on Eliquis, systolic CHF with a EF of 40-45%, indwelling Foley catheter due to BPH (last changed 1 week prior), CKD-3a, who presented to the emergency department on 11/8 with hematuria, seizure, altered mental status.  According to patient's daughter, patient had 2 episodes of seizure-like activity on day of presentation to the ED.  On admission, patient appeared to be altered and barely arousable.  Neurology consulted and patient started on IV Keppra.  Routine EEG negative.  By 11/9, patient much more awake and alert.   CT abdomen pelvis revealed malpositioned Foley catheter with balloon inflated in prostatic urethra and patient noted to have significant hematuria.  Patient seen by urology and started on bladder irrigation.  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  Patient tolerated procedure well, however in PACU, patient went into rapid atrial fibrillation and started on p.o. amiodarone and metoprolol.  Patient's hemoglobin which was lower than baseline on admission, continued to trend downward and by 11/10, down to 5.9.  Transfused PRBC (total of 3 units)  Plan to return to SNF once bed available   Assessment & Plan:   Principal Problem:   Seizure ([place]) Active Problems:   Acute metabolic encephalopathy   Hematuria   BPH (benign prostatic hyperplasia)   Acute blood loss anemia   Atrial fibrillation, chronic (HCC)   Chronic systolic CHF (congestive heart failure) (HCC)   HTN (hypertension)   HLD (hyperlipidemia)   Depression   Chronic indwelling Foley catheter   Seizure-like activity Neurology consulted.  CT and spot EEG negative.  On IV Keppra plus as needed Ativan.  Seizure precautions.   -MRI  negative -per neurology can d/c on keppra  Acute metabolic encephalopathy -appears to be back at baseline   Hematuria CT showed significant clot burden in the bladder and Foley catheter within the prostatic urethra.  Anticoagulation on hold.  Status post HOLEP. -defer to urology when anticoagulation can be restarted-- hgb appears stable  BPH -resume home meds   Acute blood loss anemia Baseline hemoglobin around 12 and on admission at 10.  Has been trending downward and by 11/10 down to 5.9.  Transfused 3 units packed red blood cells.   -hgb stable    Chronic systolic CHF (congestive heart failure) ([place])  2D echo on 06/09/2018 showed EF of 40-45%.     Atrial fibrillation, chronic (HCC) -Hold Eliquis due to hematuria Resume oral metoprolol, amiodarone  -As needed IV metoprolol 2.5 mg every 2 hours for heart rate> 125   HTN (hypertension) -BP improved   HLD (hyperlipidemia)  Lipitor   Depression - Remeron  AKI in the setting of stage IIIa chronic kidney disease: Secondary to acute blood loss anemia.   -resolved and stable  DVT prophylaxis: SCD Code Status: DNR Family Communication: no family at bedside Disposition Plan: Status is: Inpatient Remains inpatient appropriate because:  From SNF?   Level of care: Med-Surg  Consultants:  Neurology Urology   Subjective: Sleeping this AM, denies SOB  Objective: Vitals:   11/16/21 0032 11/16/21 0502 11/16/21 0509 11/16/21 0756  BP: 120/70 120/70 126/74 125/79  Pulse: (!) 58 (!) 55 80 (!) 56  Resp: (!) '21 18 19 16  '$ Temp: (!) 97.5 F (36.4 C) 97.6 F (36.4 C) 98.1 F (  36.7 C) 98.6 F (37 C)  TempSrc:      SpO2: 100% 97% 100% 99%  Weight:      Height:        Intake/Output Summary (Last 24 hours) at 11/16/2021 1029 Last data filed at 11/16/2021 0900 Gross per 24 hour  Intake --  Output 500 ml  Net -500 ml   Filed Weights   11/12/21 0538 11/12/21 1836 11/13/21 1318  Weight: 80.5 kg 80.5 kg 80.5 kg     Examination:    General: Appearance:    elderly male in no acute distress     Lungs:      respirations unlabored  Heart:    Bradycardic.   MS:   All extremities are intact.   Neurologic:   Will awaken       Data Reviewed: I have personally reviewed following labs and imaging studies Hemoglobin 5.9.  White blood cell count normalized, renal function slightly improved CBC: Recent Labs  Lab 11/12/21 0550 11/12/21 1432 11/13/21 0500 11/13/21 1138 11/13/21 1809 11/14/21 0638 11/14/21 2010 11/15/21 0315 11/16/21 0455  WBC 9.6   < > 15.8* 14.6* 17.3* 8.9  --   --  8.8  NEUTROABS 7.3  --   --   --   --  8.4*  --   --   --   HGB 10.2*   < > 8.3* 7.9* 7.1* 5.9* 6.9* 8.0* 8.6*  HCT 33.3*   < > 26.8* 25.1* 22.7* 18.7*  --   --  26.2*  MCV 75.3*   < > 74.9* 74.5* 75.7* 75.1*  --   --  77.1*  PLT 205   < > 213 192 172 166  --   --  156   < > = values in this interval not displayed.   Basic Metabolic Panel: Recent Labs  Lab 11/12/21 0550 11/13/21 0356 11/14/21 0422 11/15/21 0234 11/16/21 0455  NA 135 139 140 139 139  K 4.3 4.8 4.6 4.3 3.9  CL 108 116* 120* 120* 120*  CO2 17* 20* 11* 16* 18*  GLUCOSE 125* 110* 140* 132* 91  BUN 26* 33* 29* 23 21  CREATININE 1.51* 1.61* 1.31* 1.19 1.05  CALCIUM 8.7* 8.0* 7.0* 6.9* 7.0*   GFR: Estimated Creatinine Clearance: 50.3 mL/min (by C-G formula based on SCr of 1.05 mg/dL). Liver Function Tests: Recent Labs  Lab 11/12/21 0550  AST 19  ALT 17  ALKPHOS 81  BILITOT 0.5  PROT 7.0  ALBUMIN 2.9*   Recent Labs  Lab 11/12/21 0550  LIPASE 42   No results for input(s): "AMMONIA" in the last 168 hours. Coagulation Profile: Recent Labs  Lab 11/12/21 0550  INR 1.6*   Cardiac Enzymes: No results for input(s): "CKTOTAL", "CKMB", "CKMBINDEX", "TROPONINI" in the last 168 hours. BNP (last 3 results) No results for input(s): "PROBNP" in the last 8760 hours. HbA1C: No results for input(s): "HGBA1C" in the last 72  hours. CBG: Recent Labs  Lab 11/14/21 2109 11/15/21 0737 11/15/21 1158 11/15/21 1657 11/16/21 0759  GLUCAP 114* 118* 137* 102* 84   Lipid Profile: No results for input(s): "CHOL", "HDL", "LDLCALC", "TRIG", "CHOLHDL", "LDLDIRECT" in the last 72 hours. Thyroid Function Tests: No results for input(s): "TSH", "T4TOTAL", "FREET4", "T3FREE", "THYROIDAB" in the last 72 hours. Anemia Panel: No results for input(s): "VITAMINB12", "FOLATE", "FERRITIN", "TIBC", "IRON", "RETICCTPCT" in the last 72 hours. Sepsis Labs: No results for input(s): "PROCALCITON", "LATICACIDVEN" in the last 168 hours.  Recent Results (from the past 240  hour(s))  Culture, blood (Routine X 2) w Reflex to ID Panel     Status: None (Preliminary result)   Collection Time: 11/12/21  1:52 PM   Specimen: BLOOD  Result Value Ref Range Status   Specimen Description BLOOD LEFT ANTECUBITAL  Final   Special Requests   Final    BOTTLES DRAWN AEROBIC AND ANAEROBIC Blood Culture adequate volume   Culture   Final    NO GROWTH 4 DAYS Performed at [facility], [address] [place], [place] [address]    Report Status PENDING  Incomplete  Culture, blood (Routine X 2) w Reflex to ID Panel     Status: None (Preliminary result)   Collection Time: 11/12/21  2:32 PM   Specimen: BLOOD  Result Value Ref Range Status   Specimen Description BLOOD RIGHT ANTECUBITAL  Final   Special Requests   Final    BOTTLES DRAWN AEROBIC AND ANAEROBIC Blood Culture results may not be optimal due to an inadequate volume of blood received in culture bottles   Culture   Final    NO GROWTH 4 DAYS Performed at [facility], [address] [place], [place] [address]    Report Status PENDING  Incomplete  MRSA Next Gen by PCR, Nasal     Status: None   Collection Time: 11/12/21  6:45 PM   Specimen: Nasal Mucosa; Nasal Swab  Result Value Ref Range Status   MRSA by PCR Next Gen NOT DETECTED NOT DETECTED Final    Comment: (NOTE) The  GeneXpert MRSA Assay (FDA approved for NASAL specimens only), is one component of a comprehensive MRSA colonization surveillance program. It is not intended to diagnose MRSA infection nor to guide or monitor treatment for MRSA infections. Test performance is not FDA approved in patients less than 16 years old. Performed at [facility], [address], [place], [place] [address]   Urine Culture     Status: None   Collection Time: 11/12/21 10:11 PM   Specimen: Urine, Random  Result Value Ref Range Status   Specimen Description   Final    URINE, RANDOM Performed at [facility], [address], [place], [place] [address]    Special Requests   Final    NONE Performed at [facility], [address] [place], [place] [address]    Culture   Final    NO GROWTH Performed at [facility], [place] [address], [place], [place] [address]    Report Status 11/14/2021 FINAL  Final         Radiology Studies: MR BRAIN W WO CONTRAST  Result Date: 11/15/2021 CLINICAL DATA:  Seizure EXAM: MRI HEAD WITHOUT AND WITH CONTRAST TECHNIQUE: Multiplanar, multiecho pulse sequences of the brain and surrounding structures were obtained without and with intravenous contrast. CONTRAST:  7.40m GADAVIST GADOBUTROL 1 MMOL/ML IV SOLN COMPARISON:  None Available. FINDINGS: Brain: No acute infarct, mass effect or extra-axial collection. No acute or chronic hemorrhage. There is confluent hyperintense T2-weighted signal within the white matter. Generalized volume loss. the midline structures are normal. There is no abnormal contrast enhancement. Vascular: Major flow voids are preserved. Skull and upper cervical spine: Normal calvarium and skull base. Visualized upper cervical spine and soft tissues are normal. Sinuses/Orbits:No paranasal sinus fluid levels or advanced mucosal thickening. No mastoid or middle ear effusion. Normal orbits. IMPRESSION: 1. No acute intracranial abnormality. 2.  Findings of chronic small vessel ischemia and volume loss. Electronically Signed   By: KLennette Bihari  [place] M.D.   On: 11/15/2021 19:18        Scheduled Meds:  amiodarone  200 mg Oral Daily   atorvastatin  80 mg Oral Daily   Chlorhexidine Gluconate Cloth  6 each Topical Daily   finasteride  5 mg Oral Daily   metoprolol succinate  12.5 mg Oral Daily   tamsulosin  0.4 mg Oral QPC supper   Continuous Infusions:  levETIRAcetam 500 mg (11/15/21 2316)   sodium chloride Stopped (11/12/21 0730)     LOS: 4 days      Geradine Girt, DO [facility]   If 7PM-7AM, please contact night-coverage  11/16/2021, 10:29 AM

## 2021-11-17 DIAGNOSIS — D62 Acute posthemorrhagic anemia: Secondary | ICD-10-CM | POA: Diagnosis not present

## 2021-11-17 DIAGNOSIS — I5022 Chronic systolic (congestive) heart failure: Secondary | ICD-10-CM | POA: Diagnosis not present

## 2021-11-17 DIAGNOSIS — R569 Unspecified convulsions: Secondary | ICD-10-CM | POA: Diagnosis not present

## 2021-11-17 DIAGNOSIS — R319 Hematuria, unspecified: Secondary | ICD-10-CM | POA: Diagnosis not present

## 2021-11-17 LAB — CULTURE, BLOOD (ROUTINE X 2)
Culture: NO GROWTH
Culture: NO GROWTH
Special Requests: ADEQUATE

## 2021-11-17 LAB — CBC
HCT: 29.1 % — ABNORMAL LOW (ref 39.0–52.0)
Hemoglobin: 9.7 g/dL — ABNORMAL LOW (ref 13.0–17.0)
MCH: 25.8 pg — ABNORMAL LOW (ref 26.0–34.0)
MCHC: 33.3 g/dL (ref 30.0–36.0)
MCV: 77.4 fL — ABNORMAL LOW (ref 80.0–100.0)
Platelets: 168 10*3/uL (ref 150–400)
RBC: 3.76 MIL/uL — ABNORMAL LOW (ref 4.22–5.81)
RDW: 16.5 % — ABNORMAL HIGH (ref 11.5–15.5)
WBC: 7.5 10*3/uL (ref 4.0–10.5)
nRBC: 0 % (ref 0.0–0.2)

## 2021-11-17 LAB — BASIC METABOLIC PANEL
Anion gap: 5 (ref 5–15)
BUN: 20 mg/dL (ref 8–23)
CO2: 18 mmol/L — ABNORMAL LOW (ref 22–32)
Calcium: 7.8 mg/dL — ABNORMAL LOW (ref 8.9–10.3)
Chloride: 115 mmol/L — ABNORMAL HIGH (ref 98–111)
Creatinine, Ser: 1.23 mg/dL (ref 0.61–1.24)
GFR, Estimated: 55 mL/min — ABNORMAL LOW (ref >60)
Glucose, Bld: 102 mg/dL — ABNORMAL HIGH (ref 70–99)
Potassium: 4 mmol/L (ref 3.5–5.1)
Sodium: 138 mmol/L (ref 135–145)

## 2021-11-17 LAB — SURGICAL PATHOLOGY

## 2021-11-17 MED ORDER — MIRTAZAPINE 15 MG PO TABS
7.5000 mg | ORAL_TABLET | Freq: Every day | ORAL | Status: DC
  Administered 2021-11-17 – 2021-11-19 (×3): 7.5 mg via ORAL
  Filled 2021-11-17 (×3): qty 1

## 2021-11-17 NOTE — Care Management Important Message (Signed)
Important Message  Patient Details  Name: Jacob Avila MRN: [id number] Date of Birth: 09/16/1927   Medicare Important Message Given:  Yes     Juliann Pulse A Janiylah Hannis 11/17/2021, 2:31 PM

## 2021-11-17 NOTE — Plan of Care (Signed)
  Problem: Education: Goal: Knowledge of General Education information will improve Description: Including pain rating scale, medication(s)/side effects and non-pharmacologic comfort measures Outcome: Progressing   Problem: Pain Managment: Goal: General experience of comfort will improve Outcome: Progressing   Problem: Safety: Goal: Ability to remain free from injury will improve Outcome: Progressing   Problem: Skin Integrity: Goal: Risk for impaired skin integrity will decrease Outcome: Progressing   Problem: Safety: Goal: Violent Restraint(s) Outcome: Progressing

## 2021-11-17 NOTE — Plan of Care (Signed)
  Problem: Education: Goal: Knowledge of General Education information will improve Description: Including pain rating scale, medication(s)/side effects and non-pharmacologic comfort measures Outcome: Progressing   Problem: Clinical Measurements: Goal: Diagnostic test results will improve Outcome: Progressing   Problem: Activity: Goal: Risk for activity intolerance will decrease Outcome: Progressing   Problem: Nutrition: Goal: Adequate nutrition will be maintained Outcome: Progressing   Problem: Coping: Goal: Level of anxiety will decrease Outcome: Progressing   Problem: Pain Managment: Goal: General experience of comfort will improve Outcome: Progressing   Problem: Safety: Goal: Ability to remain free from injury will improve Outcome: Progressing   Problem: Skin Integrity: Goal: Risk for impaired skin integrity will decrease Outcome: Progressing   Problem: Safety: Goal: Violent Restraint(s) Outcome: Progressing

## 2021-11-17 NOTE — Progress Notes (Signed)
PROGRESS NOTE    Jacob Avila  NAT:[id number] DOB: 18-Nov-1927 DOA: 11/12/2021 PCP: Albina Billet, MD    Brief Narrative:  88 y.o. male with medical history significant of hypertension, hyperlipidemia, depression, atrial fibrillation on Eliquis, systolic CHF with a EF of 40-45%, indwelling Foley catheter due to BPH (last changed 1 week prior), CKD-3a, who presented to the emergency department on 11/8 with hematuria, seizure, altered mental status.  According to patient's daughter, patient had 2 episodes of seizure-like activity on day of presentation to the ED.  On admission, patient appeared to be altered and barely arousable.  Neurology consulted and patient started on IV Keppra.  Routine EEG negative and MRI unremarkable.  By 11/9, patient much more awake and alert.   CT abdomen pelvis revealed malpositioned Foley catheter with balloon inflated in prostatic urethra and patient noted to have significant hematuria.  Patient seen by urology and started on bladder irrigation.  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  Patient tolerated procedure well, however in PACU, patient went into rapid atrial fibrillation and started on p.o. amiodarone and metoprolol.  Patient's hemoglobin which was lower than baseline on admission, continued to trend downward and by 11/10, down to 5.9.  Transfused PRBC (total of 3 units).  Hematuria has since improved and urology plans on trialing a restart of his Eliquis on 11/14.  If patient tolerates, return back to skilled nursing.   Assessment & Plan:   Principal Problem:   Seizure ([place]) Active Problems:   Acute metabolic encephalopathy   Hematuria   BPH (benign prostatic hyperplasia)   Acute blood loss anemia   Atrial fibrillation, chronic (HCC)   Chronic systolic CHF (congestive heart failure) (HCC)   HTN (hypertension)   HLD (hyperlipidemia)   Depression   Chronic indwelling Foley catheter   Seizure-like  activity Neurology consulted.  CT and spot EEG negative.  On IV Keppra plus as needed Ativan.  Seizure precautions.  We will plan to DC on Keppra as per neurology.  Acute metabolic encephalopathy -appears to be back at baseline, suspect that he does have some mild underlying dementia as he is a little forgetful   Hematuria CT showed significant clot burden in the bladder and Foley catheter within the prostatic urethra.  Anticoagulation on hold.  Status post HOLEP.  Hemoglobin actually increased from previous day.  Plan to trial restart of Eliquis on 11/14  BPH -resume home meds   Acute blood loss anemia Baseline hemoglobin around 12 and on admission at 10.  Has been trending downward and by 11/10 down to 5.9.  Transfused 3 units packed red blood cells.   -hgb stable    Chronic systolic CHF (congestive heart failure) ([place])  2D echo on 06/09/2018 showed EF of 40-45%.     Atrial fibrillation, chronic (HCC) -Hold Eliquis due to hematuria Resume oral metoprolol, amiodarone  -As needed IV metoprolol 2.5 mg every 2 hours for heart rate> 125   HTN (hypertension) -BP improved   HLD (hyperlipidemia)  Lipitor   Depression - Remeron  AKI in the setting of stage IIIa chronic kidney disease: Secondary to acute blood loss anemia.   -resolved and stable  DVT prophylaxis: SCD Code Status: DNR Family Communication: Girlfriend at the bedside Disposition Plan: Status is: Inpatient Remains inpatient appropriate because:  -Tolerating restart Eliquis   Level of care: Med-Surg  Consultants:  Neurology Urology   Subjective: No complaints, wants to get out of bed  Objective: Vitals:  11/16/21 1641 11/16/21 1954 11/16/21 2106 11/16/21 2352  BP: 117/79 103/73 117/83 124/77  Pulse: 72 76 73 62  Resp: '14 20 20 16  '$ Temp: 98.5 F (36.9 C) 98.3 F (36.8 C) 98.3 F (36.8 C) 98 F (36.7 C)  TempSrc: Oral     SpO2:  100% 99% 100%  Weight:      Height:        Intake/Output Summary  (Last 24 hours) at 11/17/2021 1504 Last data filed at 11/17/2021 1024 Gross per 24 hour  Intake --  Output 1650 ml  Net -1650 ml    Filed Weights   11/12/21 0538 11/12/21 1836 11/13/21 1318  Weight: 80.5 kg 80.5 kg 80.5 kg    Examination:    General: Appearance:    elderly male in no acute distress     Lungs:   Clear to auscultation bilaterally  Heart:  Irregular rhythm, rate controlled  MS:   All extremities are intact.   Neurologic:   Will awaken       Data Reviewed: I have personally reviewed following labs and imaging studies Hemoglobin up to 9.7 from previous day CBC: Recent Labs  Lab 11/12/21 0550 11/12/21 1432 11/13/21 1138 11/13/21 1809 11/14/21 0638 11/14/21 2010 11/15/21 0315 11/16/21 0455 11/17/21 0424  WBC 9.6   < > 14.6* 17.3* 8.9  --   --  8.8 7.5  NEUTROABS 7.3  --   --   --  8.4*  --   --   --   --   HGB 10.2*   < > 7.9* 7.1* 5.9* 6.9* 8.0* 8.6* 9.7*  HCT 33.3*   < > 25.1* 22.7* 18.7*  --   --  26.2* 29.1*  MCV 75.3*   < > 74.5* 75.7* 75.1*  --   --  77.1* 77.4*  PLT 205   < > 192 172 166  --   --  156 168   < > = values in this interval not displayed.    Basic Metabolic Panel: Recent Labs  Lab 11/13/21 0356 11/14/21 0422 11/15/21 0234 11/16/21 0455 11/17/21 0424  NA 139 140 139 139 138  K 4.8 4.6 4.3 3.9 4.0  CL 116* 120* 120* 120* 115*  CO2 20* 11* 16* 18* 18*  GLUCOSE 110* 140* 132* 91 102*  BUN 33* 29* '23 21 20  '$ CREATININE 1.61* 1.31* 1.19 1.05 1.23  CALCIUM 8.0* 7.0* 6.9* 7.0* 7.8*    GFR: Estimated Creatinine Clearance: 42.9 mL/min (by C-G formula based on SCr of 1.23 mg/dL). Liver Function Tests: Recent Labs  Lab 11/12/21 0550  AST 19  ALT 17  ALKPHOS 81  BILITOT 0.5  PROT 7.0  ALBUMIN 2.9*    Recent Labs  Lab 11/12/21 0550  LIPASE 42    No results for input(s): "AMMONIA" in the last 168 hours. Coagulation Profile: Recent Labs  Lab 11/12/21 0550  INR 1.6*    Cardiac Enzymes: No results for  input(s): "CKTOTAL", "CKMB", "CKMBINDEX", "TROPONINI" in the last 168 hours. BNP (last 3 results) No results for input(s): "PROBNP" in the last 8760 hours. HbA1C: No results for input(s): "HGBA1C" in the last 72 hours. CBG: Recent Labs  Lab 11/15/21 1158 11/15/21 1657 11/16/21 0759 11/16/21 1227 11/16/21 1643  GLUCAP 137* 102* 84 94 88    Lipid Profile: No results for input(s): "CHOL", "HDL", "LDLCALC", "TRIG", "CHOLHDL", "LDLDIRECT" in the last 72 hours. Thyroid Function Tests: No results for input(s): "TSH", "T4TOTAL", "FREET4", "T3FREE", "THYROIDAB" in the last  72 hours. Anemia Panel: No results for input(s): "VITAMINB12", "FOLATE", "FERRITIN", "TIBC", "IRON", "RETICCTPCT" in the last 72 hours. Sepsis Labs: No results for input(s): "PROCALCITON", "LATICACIDVEN" in the last 168 hours.  Recent Results (from the past 240 hour(s))  Culture, blood (Routine X 2) w Reflex to ID Panel     Status: None   Collection Time: 11/12/21  1:52 PM   Specimen: BLOOD  Result Value Ref Range Status   Specimen Description BLOOD LEFT ANTECUBITAL  Final   Special Requests   Final    BOTTLES DRAWN AEROBIC AND ANAEROBIC Blood Culture adequate volume   Culture   Final    NO GROWTH 5 DAYS Performed at [facility], [address] [place], [place] [address]    Report Status 11/17/2021 FINAL  Final  Culture, blood (Routine X 2) w Reflex to ID Panel     Status: None   Collection Time: 11/12/21  2:32 PM   Specimen: BLOOD  Result Value Ref Range Status   Specimen Description BLOOD RIGHT ANTECUBITAL  Final   Special Requests   Final    BOTTLES DRAWN AEROBIC AND ANAEROBIC Blood Culture results may not be optimal due to an inadequate volume of blood received in culture bottles   Culture   Final    NO GROWTH 5 DAYS Performed at [facility], [address] [place], [place] [address]    Report Status 11/17/2021 FINAL  Final  MRSA Next Gen by PCR, Nasal     Status: None    Collection Time: 11/12/21  6:45 PM   Specimen: Nasal Mucosa; Nasal Swab  Result Value Ref Range Status   MRSA by PCR Next Gen NOT DETECTED NOT DETECTED Final    Comment: (NOTE) The GeneXpert MRSA Assay (FDA approved for NASAL specimens only), is one component of a comprehensive MRSA colonization surveillance program. It is not intended to diagnose MRSA infection nor to guide or monitor treatment for MRSA infections. Test performance is not FDA approved in patients less than 53 years old. Performed at [facility], [address] [place], [place] [address]   Urine Culture     Status: None   Collection Time: 11/12/21 10:11 PM   Specimen: Urine, Random  Result Value Ref Range Status   Specimen Description   Final    URINE, RANDOM Performed at [facility], [address] [place], [place] [address]    Special Requests   Final    NONE Performed at [facility], [address], [place], [place] [address]    Culture   Final    NO GROWTH Performed at [facility], [place] [address] [place], [place] [address]    Report Status 11/14/2021 FINAL  Final         Radiology Studies: MR BRAIN W WO CONTRAST  Result Date: 11/15/2021 CLINICAL DATA:  Seizure EXAM: MRI HEAD WITHOUT AND WITH CONTRAST TECHNIQUE: Multiplanar, multiecho pulse sequences of the brain and surrounding structures were obtained without and with intravenous contrast. CONTRAST:  7.60m GADAVIST GADOBUTROL 1 MMOL/ML IV SOLN COMPARISON:  None Available. FINDINGS: Brain: No acute infarct, mass effect or extra-axial collection. No acute or chronic hemorrhage. There is confluent hyperintense T2-weighted signal within the white matter. Generalized volume loss. the midline structures are normal. There is no abnormal contrast enhancement. Vascular: Major flow voids are preserved. Skull and upper cervical spine: Normal calvarium and skull base. Visualized upper cervical spine and soft tissues are  normal. Sinuses/Orbits:No paranasal  sinus fluid levels or advanced mucosal thickening. No mastoid or middle ear effusion. Normal orbits. IMPRESSION: 1. No acute intracranial abnormality. 2. Findings of chronic small vessel ischemia and volume loss. Electronically Signed   By: Ulyses Jarred M.D.   On: 11/15/2021 19:18        Scheduled Meds:  amiodarone  200 mg Oral Daily   atorvastatin  80 mg Oral Daily   Chlorhexidine Gluconate Cloth  6 each Topical Daily   finasteride  5 mg Oral Daily   levETIRAcetam  500 mg Oral BID   metoprolol succinate  12.5 mg Oral Daily   mirtazapine  7.5 mg Oral QHS   tamsulosin  0.4 mg Oral QPC supper   Continuous Infusions:  sodium chloride Stopped (11/12/21 0730)     LOS: 5 days      Annita Brod, DO [facility]   If 7PM-7AM, please contact night-coverage  11/17/2021, 3:04 PM

## 2021-11-17 NOTE — Procedures (Signed)
Routine EEG Report  Jacob Avila is a 88 y.o. male with a history of seizure who is undergoing an EEG to evaluate for seizures.  Report: This EEG was acquired with electrodes placed according to the International 10-20 electrode system (including Fp1, Fp2, F3, F4, C3, C4, P3, P4, O1, O2, T3, T4, T5, T6, A1, A2, Fz, Cz, Pz). The following electrodes were missing or displaced: none.  The occipital dominant rhythm was 6 Hz. This activity is reactive to stimulation. Drowsiness was manifested by background fragmentation; deeper stages of sleep were not identified. There was no focal slowing. There were no interictal epileptiform discharges. There were no electrographic seizures identified. There was no abnormal response to photic stimulation or hyperventilation.   Impression and clinical correlation: This EEG was obtained while awake and drowsy and is abnormal due to moderate diffuse slowing indicative of global cerebral dysfunction. Epileptiform abnormalities were not seen during this recording.  Su Monks, MD [facility] [phone]  If 7pm- 7am, please page neurology on call as listed in [place].

## 2021-11-17 NOTE — Progress Notes (Signed)
Patient is refusing all forms of care from staff. He is refusing all medications, lab draws, and vital sign checks. Nurse spoke with patient but he was stern in his decision. Letting Hospitalist know.

## 2021-11-18 DIAGNOSIS — R319 Hematuria, unspecified: Secondary | ICD-10-CM | POA: Diagnosis not present

## 2021-11-18 DIAGNOSIS — D62 Acute posthemorrhagic anemia: Secondary | ICD-10-CM | POA: Diagnosis not present

## 2021-11-18 DIAGNOSIS — R569 Unspecified convulsions: Secondary | ICD-10-CM | POA: Diagnosis not present

## 2021-11-18 DIAGNOSIS — I5022 Chronic systolic (congestive) heart failure: Secondary | ICD-10-CM | POA: Diagnosis not present

## 2021-11-18 LAB — CBC
HCT: 29.4 % — ABNORMAL LOW (ref 39.0–52.0)
Hemoglobin: 9.7 g/dL — ABNORMAL LOW (ref 13.0–17.0)
MCH: 25.3 pg — ABNORMAL LOW (ref 26.0–34.0)
MCHC: 33 g/dL (ref 30.0–36.0)
MCV: 76.6 fL — ABNORMAL LOW (ref 80.0–100.0)
Platelets: 176 10*3/uL (ref 150–400)
RBC: 3.84 MIL/uL — ABNORMAL LOW (ref 4.22–5.81)
RDW: 16.7 % — ABNORMAL HIGH (ref 11.5–15.5)
WBC: 7.4 10*3/uL (ref 4.0–10.5)
nRBC: 0 % (ref 0.0–0.2)

## 2021-11-18 LAB — GLUCOSE, CAPILLARY
Glucose-Capillary: 177 mg/dL — ABNORMAL HIGH (ref 70–99)
Glucose-Capillary: 123 mg/dL — ABNORMAL HIGH (ref 70–99)

## 2021-11-18 LAB — BASIC METABOLIC PANEL
Anion gap: 5 (ref 5–15)
BUN: 17 mg/dL (ref 8–23)
CO2: 19 mmol/L — ABNORMAL LOW (ref 22–32)
Calcium: 7.7 mg/dL — ABNORMAL LOW (ref 8.9–10.3)
Chloride: 114 mmol/L — ABNORMAL HIGH (ref 98–111)
Creatinine, Ser: 1.15 mg/dL (ref 0.61–1.24)
GFR, Estimated: >60 mL/min (ref >60)
Glucose, Bld: 92 mg/dL (ref 70–99)
Potassium: 3.8 mmol/L (ref 3.5–5.1)
Sodium: 138 mmol/L (ref 135–145)

## 2021-11-18 LAB — BRAIN NATRIURETIC PEPTIDE: B Natriuretic Peptide: 218.6 pg/mL — ABNORMAL HIGH (ref 0.0–100.0)

## 2021-11-18 MED ORDER — APIXABAN 5 MG PO TABS
5.0000 mg | ORAL_TABLET | Freq: Two times a day (BID) | ORAL | Status: DC
  Administered 2021-11-18 (×2): 5 mg via ORAL
  Filled 2021-11-18 (×2): qty 1

## 2021-11-18 NOTE — Progress Notes (Addendum)
Physical Therapy Treatment Patient Details Name: Jacob Avila MRN: [id number] DOB: 1927/04/14 Today's Date: 11/18/2021   History of Present Illness presented to ER secondary to hematuria, seizure-like activity and AMS; admitted for management of acute metabolic encephalopathy and malpositioned foley (with clot burden), s/p HoLEP (11/13/21).  Hospital course also complicated by rapid afib and post-op blood-loss anemia.    PT Comments    Patient is very eager and motivated to participate with PT. He continues to require physical assistance with bed mobility and transfers. He progressed ambulating a short distance in the room with the rolling walker with steadying assistance provided. The patient will need to go go SNF at discharge for ongoing PT to maximize independence, decrease caregiver burden, and facilitate return to prior level of function.    Recommendations for follow up therapy are one component of a multi-disciplinary discharge planning process, led by the attending physician.  Recommendations may be updated based on patient status, additional functional criteria and insurance authorization.  Follow Up Recommendations  Skilled nursing-short term rehab (<3 hours/day) Can patient physically be transported by private vehicle: Yes   Assistance Recommended at Discharge Frequent or constant Supervision/Assistance  Patient can return home with the following A lot of help with walking and/or transfers;A lot of help with bathing/dressing/bathroom   Equipment Recommendations   (to be determined at next level of care)    Recommendations for Other Services       Precautions / Restrictions Precautions Precautions: Fall Restrictions Weight Bearing Restrictions: No     Mobility  Bed Mobility Overal bed mobility: Needs Assistance Bed Mobility: Supine to Sit, Sit to Supine     Supine to sit: Min assist, Mod assist Sit to supine: Min assist   General bed mobility comments:  assistance for LLE support. verbal cues for technique    Transfers Overall transfer level: Needs assistance Equipment used: Rolling walker (2 wheels) Transfers: Sit to/from Stand Sit to Stand: Mod assist           General transfer comment: lifitng assistance along with anterior weight shifting assistance provided. verbal cues for technique    Ambulation/Gait Ambulation/Gait assistance: Min assist Gait Distance (Feet): 15 Feet Assistive device: Rolling walker (2 wheels)   Gait velocity: decrease     General Gait Details: forward, backwards, and side steps. steadying assistance provided initially. activity tolerance limited by fatigue   Stairs             Wheelchair Mobility    Modified Rankin (Stroke Patients Only)       Balance Overall balance assessment: Needs assistance Sitting-balance support: No upper extremity supported, Feet supported Sitting balance-Leahy Scale: Good     Standing balance support: Bilateral upper extremity supported, Reliant on assistive device for balance Standing balance-Leahy Scale: Poor Standing balance comment: extneral support required                            Cognition Arousal/Alertness: Awake/alert Behavior During Therapy: WFL for tasks assessed/performed Overall Cognitive Status: Within Functional Limits for tasks assessed                                          Exercises      General Comments        Pertinent Vitals/Pain Pain Assessment Pain Assessment: No/denies pain    Home Living  Prior Function            PT Goals (current goals can now be found in the care plan section) Acute Rehab PT Goals Patient Stated Goal: to get to rehab PT Goal Formulation: With patient Time For Goal Achievement: 11/29/21 Potential to Achieve Goals: Good Progress towards PT goals: Progressing toward goals    Frequency    Min 2X/week      PT Plan  Current plan remains appropriate    Co-evaluation              AM-PAC PT "6 Clicks" Mobility   Outcome Measure  Help needed turning from your back to your side while in a flat bed without using bedrails?: A Little Help needed moving from lying on your back to sitting on the side of a flat bed without using bedrails?: A Little Help needed moving to and from a bed to a chair (including a wheelchair)?: A Lot Help needed standing up from a chair using your arms (e.g., wheelchair or bedside chair)?: A Lot Help needed to walk in hospital room?: A Lot Help needed climbing 3-5 steps with a railing? : A Lot 6 Click Score: 14    End of Session   Activity Tolerance: Patient tolerated treatment well;Patient limited by fatigue Patient left: in bed;with call bell/phone within reach;with bed alarm set;with family/visitor present   PT Visit Diagnosis: Muscle weakness (generalized) (M62.81);Difficulty in walking, not elsewhere classified (R26.2)     Time: 3335-4562 PT Time Calculation (min) (ACUTE ONLY): 24 min  Charges:  $Therapeutic Activity: 23-37 mins                     Minna Merritts, PT, MPT   Jacob Avila 11/18/2021, 4:00 PM

## 2021-11-18 NOTE — Progress Notes (Addendum)
[place] for apixaban  Indication: atrial fibrillation  No Known Allergies  Patient Measurements: Height: 6' (182.9 cm) Weight: 80.5 kg (177 lb 7.5 oz) IBW/kg (Calculated) : 77.6  Vital Signs: Temp: 97.4 F (36.3 C) (11/14 0745) BP: 116/70 (11/14 0745) Pulse Rate: 72 (11/14 0745)  Labs: Recent Labs    11/16/21 0455 11/17/21 0424 11/18/21 0553  HGB 8.6* 9.7* 9.7*  HCT 26.2* 29.1* 29.4*  PLT 156 168 176  CREATININE 1.05 1.23 1.15    Estimated Creatinine Clearance: 45.9 mL/min (by C-G formula based on SCr of 1.15 mg/dL).  Medical History: Past Medical History:  Diagnosis Date   1st degree AV block 05/13/2012   Arteriosclerosis of coronary artery 11/12/2014   Atrial fibrillation ([place]) 11/30/2016   BP (high blood pressure) 05/12/2012   Bradycardia 05/12/2012   Dizziness 05/12/2012   HLD (hyperlipidemia)     Medications: apixaban 5 mg BID PTA  Assessment: 88 year old male admitted with hematuria, seizure, altered mental status. Past medical history pertinent to atrial fibrillation (chadsvasc of at least 4), HFrEF (EF 40-45%), BPH, HLD, HTN, CKD3. CT with significant clot burden in bladder. S/p HOLEP. Hgb now stable 11/14 and trending up. Pharmacy has been consulted to restart Eliquis   Plan:  Restart home Eliquis 5 mg BID (age > 75; scr < 1.5; weight > 60 kg). First dose 11/14 AM.   Glean Salvo, PharmD Clinical Pharmacist  11/18/2021 9:52 AM

## 2021-11-18 NOTE — Progress Notes (Signed)
PROGRESS NOTE    Jacob Avila  ZOX:[id number] DOB: May 26, 1927 DOA: 11/12/2021 PCP: Albina Billet, MD    Brief Narrative:  88 y.o. male with medical history significant of hypertension, hyperlipidemia, depression, atrial fibrillation on Eliquis, systolic CHF with a EF of 40-45%, indwelling Foley catheter due to BPH (last changed 1 week prior), CKD-3a, who presented to the emergency department on 11/8 with hematuria, seizure, altered mental status.  According to patient's daughter, patient had 2 episodes of seizure-like activity on day of presentation to the ED.  On admission, patient appeared to be altered and barely arousable.  Neurology consulted and patient started on IV Keppra.  Routine EEG negative and MRI unremarkable.  By 11/9, patient much more awake and alert.   CT abdomen pelvis revealed malpositioned Foley catheter with balloon inflated in prostatic urethra and patient noted to have significant hematuria.  Patient seen by urology and started on bladder irrigation.  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  Patient tolerated procedure well, however in PACU, patient went into rapid atrial fibrillation and started on p.o. amiodarone and metoprolol.  Patient's hemoglobin which was lower than baseline on admission, continued to trend downward and by 11/10, down to 5.9.  Transfused PRBC (total of 3 units).  Hematuria has since improved and hemoglobin stabilized.  Eliquis restarted morning of 11/14.  If patient can tolerate and cleared by urology, potential discharge to skilled nursing 11/15.     Assessment & Plan:   Principal Problem:   Seizure ([place]) Active Problems:   Acute metabolic encephalopathy   Hematuria   BPH (benign prostatic hyperplasia)   Acute blood loss anemia   Atrial fibrillation, chronic (HCC)   Chronic systolic CHF (congestive heart failure) (HCC)   HTN (hypertension)   HLD (hyperlipidemia)   Depression   Chronic  indwelling Foley catheter   Seizure-like activity Neurology consulted.  CT and spot EEG negative.  On IV Keppra plus as needed Ativan.  Seizure precautions.  We will plan to DC on Keppra as per neurology.  Acute metabolic encephalopathy -appears to be back at baseline, suspect that he does have some mild underlying dementia as he is a little forgetful   Hematuria CT showed significant clot burden in the bladder and Foley catheter within the prostatic urethra.  Anticoagulation on hold.  Status post HOLEP.  Hemoglobin actually increased from previous day.  Monitoring now that Eliquis has been restarted.  BPH -resume home meds   Acute blood loss anemia Baseline hemoglobin around 12 and on admission at 10.  Has been trending downward and by 11/10 down to 5.9.  Transfused 3 units packed red blood cells.   -hgb stable, currently at 9.7.    Chronic systolic CHF (congestive heart failure) ([place])  2D echo on 06/09/2018 showed EF of 40-45%.     Atrial fibrillation, chronic (HCC) -Hold Eliquis due to hematuria Resume oral metoprolol, amiodarone  -As needed IV metoprolol 2.5 mg every 2 hours for heart rate> 125   HTN (hypertension) -BP improved   HLD (hyperlipidemia)  Lipitor   Depression - Remeron  AKI in the setting of stage IIIa chronic kidney disease: Secondary to acute blood loss anemia.   -resolved and stable  DVT prophylaxis: SCD Code Status: DNR Family Communication: Updated daughter by phone. Disposition Plan: Status is: Inpatient Remains inpatient appropriate because:  -Tolerating restart Eliquis   Level of care: Med-Surg  Consultants:  Neurology Urology   Subjective: No complaints, feels good  Objective: Vitals:   11/16/21 2352 11/17/21 1655 11/18/21 0000 11/18/21 0745  BP: 124/77 115/81 118/79 116/70  Pulse: 62 87 87 72  Resp: '16  16 16  '$ Temp: 98 F (36.7 C) 98.2 F (36.8 C) (!) 97.5 F (36.4 C) (!) 97.4 F (36.3 C)  TempSrc:      SpO2: 100% 100%  100% 99%  Weight:      Height:        Intake/Output Summary (Last 24 hours) at 11/18/2021 1225 Last data filed at 11/18/2021 1024 Gross per 24 hour  Intake --  Output 2000 ml  Net -2000 ml    Filed Weights   11/12/21 0538 11/12/21 1836 11/13/21 1318  Weight: 80.5 kg 80.5 kg 80.5 kg    Examination:    General: Appearance:  Alert and oriented x2, no acute distress     Lungs:   Clear to auscultation bilaterally  Heart:  Irregular rhythm, rate controlled  MS: No clubbing or enosis or edema  Neurologic: No focal deficits       Data Reviewed: I have personally reviewed following labs and imaging studies Hemoglobin steady at 9.7 CBC: Recent Labs  Lab 11/12/21 0550 11/12/21 1432 11/13/21 1809 11/14/21 0638 11/14/21 2010 11/15/21 0315 11/16/21 0455 11/17/21 0424 11/18/21 0553  WBC 9.6   < > 17.3* 8.9  --   --  8.8 7.5 7.4  NEUTROABS 7.3  --   --  8.4*  --   --   --   --   --   HGB 10.2*   < > 7.1* 5.9* 6.9* 8.0* 8.6* 9.7* 9.7*  HCT 33.3*   < > 22.7* 18.7*  --   --  26.2* 29.1* 29.4*  MCV 75.3*   < > 75.7* 75.1*  --   --  77.1* 77.4* 76.6*  PLT 205   < > 172 166  --   --  156 168 176   < > = values in this interval not displayed.    Basic Metabolic Panel: Recent Labs  Lab 11/14/21 0422 11/15/21 0234 11/16/21 0455 11/17/21 0424 11/18/21 0553  NA 140 139 139 138 138  K 4.6 4.3 3.9 4.0 3.8  CL 120* 120* 120* 115* 114*  CO2 11* 16* 18* 18* 19*  GLUCOSE 140* 132* 91 102* 92  BUN 29* '23 21 20 17  '$ CREATININE 1.31* 1.19 1.05 1.23 1.15  CALCIUM 7.0* 6.9* 7.0* 7.8* 7.7*    GFR: Estimated Creatinine Clearance: 45.9 mL/min (by C-G formula based on SCr of 1.15 mg/dL). Liver Function Tests: Recent Labs  Lab 11/12/21 0550  AST 19  ALT 17  ALKPHOS 81  BILITOT 0.5  PROT 7.0  ALBUMIN 2.9*    Recent Labs  Lab 11/12/21 0550  LIPASE 42    No results for input(s): "AMMONIA" in the last 168 hours. Coagulation Profile: Recent Labs  Lab 11/12/21 0550   INR 1.6*    Cardiac Enzymes: No results for input(s): "CKTOTAL", "CKMB", "CKMBINDEX", "TROPONINI" in the last 168 hours. BNP (last 3 results) No results for input(s): "PROBNP" in the last 8760 hours. HbA1C: No results for input(s): "HGBA1C" in the last 72 hours. CBG: Recent Labs  Lab 11/15/21 1657 11/16/21 0759 11/16/21 1227 11/16/21 1643 11/18/21 1207  GLUCAP 102* 84 94 88 177*    Lipid Profile: No results for input(s): "CHOL", "HDL", "LDLCALC", "TRIG", "CHOLHDL", "LDLDIRECT" in the last 72 hours. Thyroid Function Tests: No results for input(s): "TSH", "T4TOTAL", "FREET4", "T3FREE", "THYROIDAB" in the last  72 hours. Anemia Panel: No results for input(s): "VITAMINB12", "FOLATE", "FERRITIN", "TIBC", "IRON", "RETICCTPCT" in the last 72 hours. Sepsis Labs: No results for input(s): "PROCALCITON", "LATICACIDVEN" in the last 168 hours.  Recent Results (from the past 240 hour(s))  Culture, blood (Routine X 2) w Reflex to ID Panel     Status: None   Collection Time: 11/12/21  1:52 PM   Specimen: BLOOD  Result Value Ref Range Status   Specimen Description BLOOD LEFT ANTECUBITAL  Final   Special Requests   Final    BOTTLES DRAWN AEROBIC AND ANAEROBIC Blood Culture adequate volume   Culture   Final    NO GROWTH 5 DAYS Performed at [facility], [address] [place], [place] [address]    Report Status 11/17/2021 FINAL  Final  Culture, blood (Routine X 2) w Reflex to ID Panel     Status: None   Collection Time: 11/12/21  2:32 PM   Specimen: BLOOD  Result Value Ref Range Status   Specimen Description BLOOD RIGHT ANTECUBITAL  Final   Special Requests   Final    BOTTLES DRAWN AEROBIC AND ANAEROBIC Blood Culture results may not be optimal due to an inadequate volume of blood received in culture bottles   Culture   Final    NO GROWTH 5 DAYS Performed at [facility], [address] [place], [place] [address]    Report Status 11/17/2021 FINAL  Final  MRSA  Next Gen by PCR, Nasal     Status: None   Collection Time: 11/12/21  6:45 PM   Specimen: Nasal Mucosa; Nasal Swab  Result Value Ref Range Status   MRSA by PCR Next Gen NOT DETECTED NOT DETECTED Final    Comment: (NOTE) The GeneXpert MRSA Assay (FDA approved for NASAL specimens only), is one component of a comprehensive MRSA colonization surveillance program. It is not intended to diagnose MRSA infection nor to guide or monitor treatment for MRSA infections. Test performance is not FDA approved in patients less than 42 years old. Performed at [facility], [address], [place], [place] [address]   Urine Culture     Status: None   Collection Time: 11/12/21 10:11 PM   Specimen: Urine, Random  Result Value Ref Range Status   Specimen Description   Final    URINE, RANDOM Performed at [facility], [address], [place], [place] [address]    Special Requests   Final    NONE Performed at [facility], [address], [place], [place] [address]    Culture   Final    NO GROWTH Performed at [facility], [place] [address] [place], [place] [address]    Report Status 11/14/2021 FINAL  Final         Radiology Studies: No results found.      Scheduled Meds:  amiodarone  200 mg Oral Daily   apixaban  5 mg Oral BID   atorvastatin  80 mg Oral Daily   Chlorhexidine Gluconate Cloth  6 each Topical Daily   finasteride  5 mg Oral Daily   levETIRAcetam  500 mg Oral BID   metoprolol succinate  12.5 mg Oral Daily   mirtazapine  7.5 mg Oral QHS   tamsulosin  0.4 mg Oral QPC supper   Continuous Infusions:  sodium chloride Stopped (11/12/21 0730)     LOS: 6 days      Annita Brod, MD [facility]   If 7PM-7AM, please  contact night-coverage  11/18/2021, 12:25 PM

## 2021-11-18 NOTE — TOC Progression Note (Signed)
Transition of Care [facility]) - Progression Note    Patient Details  Name: Jacob Avila MRN: [id number] Date of Birth: 05/30/1927  Transition [facility]) CM/SW Contact  Laurena Slimmer, RN Phone Number: 11/18/2021, 10:22 AM  Clinical Narrative:    Message sent to [facility] admission regarding update on status of referral. Awaiting response.    Expected Discharge Plan: [place] Barriers to Discharge: Continued Medical Work up  Expected Discharge Plan and Services Expected Discharge Plan: [place] arrangements for the past 2 months: Single Family Home                                       Social Determinants of Health (SDOH) Interventions    Readmission Risk Interventions     No data to display

## 2021-11-18 NOTE — TOC Progression Note (Signed)
Transition of Care [facility]) - Progression Note    Patient Details  Name: Jacob Avila MRN: [id number] Date of Birth: Jul 18, 1927  Transition of Care [facility]) CM/SW Contact  Laurena Slimmer, RN Phone Number: 11/18/2021, 12:53 PM  Clinical Narrative:    Erskin Burnet at [facility]. Patient will be accepted abck to facility. He is tentatively assigned room 705 and can transfer to today or tomorrow pending auth.   Contacted Tammy at [facility] to advised of bed and to initiate auth.   Spoke with patient's daughter [name] regarding discharge plan and pending auth.    Expected Discharge Plan: [place] Barriers to Discharge: Continued Medical Work up  Expected Discharge Plan and Services Expected Discharge Plan: [place] arrangements for the past 2 months: Single Family Home                                       Social Determinants of Health (SDOH) Interventions    Readmission Risk Interventions     No data to display

## 2021-11-19 ENCOUNTER — Telehealth: Payer: Self-pay

## 2021-11-19 DIAGNOSIS — R569 Unspecified convulsions: Secondary | ICD-10-CM | POA: Diagnosis not present

## 2021-11-19 LAB — BASIC METABOLIC PANEL
Anion gap: 5 (ref 5–15)
BUN: 16 mg/dL (ref 8–23)
CO2: 18 mmol/L — ABNORMAL LOW (ref 22–32)
Calcium: 7.6 mg/dL — ABNORMAL LOW (ref 8.9–10.3)
Chloride: 115 mmol/L — ABNORMAL HIGH (ref 98–111)
Creatinine, Ser: 1.12 mg/dL (ref 0.61–1.24)
GFR, Estimated: >60 mL/min (ref >60)
Glucose, Bld: 99 mg/dL (ref 70–99)
Potassium: 3.9 mmol/L (ref 3.5–5.1)
Sodium: 138 mmol/L (ref 135–145)

## 2021-11-19 LAB — CBC
HCT: 27.5 % — ABNORMAL LOW (ref 39.0–52.0)
Hemoglobin: 8.8 g/dL — ABNORMAL LOW (ref 13.0–17.0)
MCH: 25 pg — ABNORMAL LOW (ref 26.0–34.0)
MCHC: 32 g/dL (ref 30.0–36.0)
MCV: 78.1 fL — ABNORMAL LOW (ref 80.0–100.0)
Platelets: 149 10*3/uL — ABNORMAL LOW (ref 150–400)
RBC: 3.52 MIL/uL — ABNORMAL LOW (ref 4.22–5.81)
RDW: 17.2 % — ABNORMAL HIGH (ref 11.5–15.5)
WBC: 6.9 10*3/uL (ref 4.0–10.5)
nRBC: 0 % (ref 0.0–0.2)

## 2021-11-19 LAB — GLUCOSE, CAPILLARY
Glucose-Capillary: 91 mg/dL (ref 70–99)
Glucose-Capillary: 121 mg/dL — ABNORMAL HIGH (ref 70–99)
Glucose-Capillary: 109 mg/dL — ABNORMAL HIGH (ref 70–99)

## 2021-11-19 NOTE — Telephone Encounter (Signed)
Called pt's daughter per DPR informed her of the information below. Moved post appt from 12wks to 8 per note.

## 2021-11-19 NOTE — Plan of Care (Signed)

## 2021-11-19 NOTE — TOC Progression Note (Signed)
Transition of Care [facility]) - Progression Note    Patient Details  Name: Jacob Avila MRN: [id number] Date of Birth: September 03, 1927  Transition [facility]) CM/SW Contact  Laurena Slimmer, RN Phone Number: 11/19/2021, 4:37 PM  Clinical Narrative:    Spoke with family at bedside. Advised Authorization was still pending.      Expected Discharge Plan: [place] Barriers to Discharge: Continued Medical Work up  Expected Discharge Plan and Services Expected Discharge Plan: [place] arrangements for the past 2 months: Single Family Home                                       Social Determinants of Health (SDOH) Interventions    Readmission Risk Interventions     No data to display

## 2021-11-19 NOTE — Progress Notes (Addendum)
[place] [facility] [place] NAME: Jacob Avila    MR#:  [id number]  DATE OF BIRTH:  [birth year]  SUBJECTIVE:  external urine catheter shows mild hematuria. Patient denies any difficulty avoiding. No fever. No abdominal pain.    VITALS:  Blood pressure 112/75, pulse 65, temperature (!) 97.4 F (36.3 C), resp. rate 16, height 6' (1.829 m), weight 80.5 kg, SpO2 100 %.  PHYSICAL EXAMINATION:   GENERAL:  88 y.o.-year-old patient lying in the bed with no acute distress.  LUNGS: Normal breath sounds bilaterally, no wheezing CARDIOVASCULAR: S1, S2 normal. No murmurs,   ABDOMEN: Soft, nontender, nondistended. Bowel sounds present.  EXTREMITIES: No  edema b/l.    NEUROLOGIC: nonfocal  patient is alert and awake SKIN: No obvious rash, lesion, or ulcer.   LABORATORY PANEL:  CBC Recent Labs  Lab 11/19/21 0332  WBC 6.9  HGB 8.8*  HCT 27.5*  PLT 149*    Chemistries  Recent Labs  Lab 11/19/21 0332  NA 138  K 3.9  CL 115*  CO2 18*  GLUCOSE 99  BUN 16  CREATININE 1.12  CALCIUM 7.6*   Assessment and Plan 88 y.o. male with medical history significant of hypertension, hyperlipidemia, depression, atrial fibrillation on Eliquis, systolic CHF with a EF of 40-45%, indwelling Foley catheter due to BPH (last changed 1 week prior), CKD-3a, who presented to the emergency department on 11/8 with hematuria, seizure, altered mental status.  According to patient's daughter, patient had 2 episodes of seizure-like activity on day of presentation to the ED.  On admission, patient appeared to be altered and barely arousable.  Neurology consulted and patient started on IV Keppra.  Routine EEG negative and MRI unremarkable.  By 11/9, patient much more awake and alert.   CT abdomen pelvis revealed malpositioned Foley catheter with balloon inflated in prostatic urethra and patient noted to have significant hematuria.  Patient seen by urology and started on bladder  irrigation.  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  Patient tolerated procedure well, however in PACU, patient went into rapid atrial fibrillation and started on p.o. amiodarone and metoprolol.  Hematuria CT showed significant clot burden in the bladder and Foley catheter within the prostatic urethra.   Status post HOLEP.  Hemoglobin actually increased from previous day.  -- Discussed with urology Dr. Diamantina [place] recommends hold eliquis for two weeks since patient is having mild hematuria. Hemoglobin stable. -- Transfuse as needed  BPH -resume home meds   Acute blood loss anemia Baseline hemoglobin around 12 and on admission at 10.  Has been trending downward and by 11/10 down to 5.9.  Transfused 3 units packed red blood cells.   -hgb stable, currently at 8.8   Seizure-like activity Neurology consulted.  CT and spot EEG negative.  On IV Keppra plus as needed Ativan.  Seizure precautions.  We will plan to DC on Keppra as per neurology.   Acute metabolic encephalopathy -appears to be back at baseline, suspect that he does have some mild underlying dementia as he is a little forgetful    Chronic systolic CHF (congestive heart failure) ([place])  2D echo on 06/09/2018 showed EF of 40-45%.     Atrial fibrillation, chronic (HCC) -Hold Eliquis due to hematuria Resume oral metoprolol, amiodarone  -As needed IV metoprolol 2.5 mg every 2 hours for heart rate> 125   HTN (hypertension) -BP improved   HLD (hyperlipidemia)  Lipitor   Depression -  Remeron   AKI in the setting of stage IIIa chronic kidney disease: Secondary to acute blood loss anemia.   -resolved and stable   DVT prophylaxis: SCD Code Status: DNR Family Communication: Updated daughter by phone. Disposition Plan: Status is: Inpatient Remains inpatient appropriate because: monitoring overnight for hematuria and need to check hemoglobin tomorrow   If Remains stable tomorrow and  receives insurance authorization for rehab will discharge.  TOTAL TIME TAKING CARE OF THIS PATIENT: 35 minutes.  >50% time spent on counselling and coordination of care  Note: This dictation was prepared with Dragon dictation along with smaller phrase technology. Any transcriptional errors that result from this process are unintentional.  Fritzi Mandes M.D    [facility]   CC: Primary care physician; Albina Billet, MD

## 2021-11-19 NOTE — Progress Notes (Signed)
Urology Inpatient Progress Note  Subjective: Eliquis resumed yesterday with recurrent gross hematuria overnight.  He is afebrile, VSS this morning. Hemoglobin down today, 8.8. He denies pain today.  He continues to void spontaneously.  External urinary management system is in place draining clear, light red urine without clots.  Anti-infectives: Anti-infectives (From admission, onward)    Start     Dose/Rate Route Frequency Ordered Stop   11/13/21 0600  cefTRIAXone (ROCEPHIN) 1 g in sodium chloride 0.9 % 100 mL IVPB        1 g 200 mL/hr over 30 Minutes Intravenous Every 24 hours 11/12/21 1557 11/15/21 0602   11/12/21 1500  ceFEPIme (MAXIPIME) 2 g in sodium chloride 0.9 % 100 mL IVPB  Status:  Discontinued        2 g 200 mL/hr over 30 Minutes Intravenous Every 12 hours 11/12/21 1415 11/12/21 1557       Current Facility-Administered Medications  Medication Dose Route Frequency Provider Last Rate Last Admin   acetaminophen (TYLENOL) suppository 650 mg  650 mg Rectal Q6H PRN Billey Co, MD       acetaminophen (TYLENOL) tablet 650 mg  650 mg Oral Q6H PRN Billey Co, MD   650 mg at 11/15/21 4403   amiodarone (PACERONE) tablet 200 mg  200 mg Oral Daily Ralene Muskrat B, MD   200 mg at 11/19/21 0903   atorvastatin (LIPITOR) tablet 80 mg  80 mg Oral Daily Ralene Muskrat B, MD   80 mg at 11/19/21 4742   Chlorhexidine Gluconate Cloth 2 % PADS 6 each  6 each Topical Daily Billey Co, MD   6 each at 11/19/21 5956   finasteride (PROSCAR) tablet 5 mg  5 mg Oral Daily Ralene Muskrat B, MD   5 mg at 11/19/21 0903   levETIRAcetam (KEPPRA) tablet 500 mg  500 mg Oral BID Eulogio Bear U, DO   500 mg at 11/19/21 0904   LORazepam (ATIVAN) injection 1 mg  1 mg Intravenous Q2H PRN Billey Co, MD       metoprolol succinate (TOPROL-XL) 24 hr tablet 12.5 mg  12.5 mg Oral Daily Vann, Jessica U, DO   12.5 mg at 11/19/21 3875   metoprolol tartrate (LOPRESSOR) injection 2.5 mg  2.5  mg Intravenous Q2H PRN Billey Co, MD       mirtazapine (REMERON) tablet 7.5 mg  7.5 mg Oral QHS Mickeal Skinner A, RPH   7.5 mg at 11/18/21 2142   nitroGLYCERIN (NITROSTAT) SL tablet 0.4 mg  0.4 mg Sublingual Q5 min PRN Ralene Muskrat B, MD       ondansetron (ZOFRAN) injection 4 mg  4 mg Intravenous Q8H PRN Billey Co, MD       sodium chloride 0.9 % bolus 500 mL  500 mL Intravenous Once Billey Co, MD   Held at 11/12/21 0730   tamsulosin (FLOMAX) capsule 0.4 mg  0.4 mg Oral QPC supper Ralene Muskrat B, MD   0.4 mg at 11/18/21 1816   Objective: Vital signs in last 24 hours: Temp:  [97.4 F (36.3 C)-98.5 F (36.9 C)] 97.4 F (36.3 C) (11/15 0700) Pulse Rate:  [65-88] 65 (11/15 0752) Resp:  [16-18] 16 (11/15 0752) BP: (101-114)/(73-75) 112/75 (11/15 0752) SpO2:  [98 %-100 %] 100 % (11/15 0752)  Intake/Output from previous day: 11/14 0701 - 11/15 0700 In: -  Out: 1450 [Urine:900; Stool:550] Intake/Output this shift: Total I/O In: 240 [P.O.:240] Out: -   Physical Exam  Vitals and nursing note reviewed.  Constitutional:      General: He is not in acute distress.    Appearance: He is not ill-appearing, toxic-appearing or diaphoretic.  HENT:     Head: Normocephalic and atraumatic.  Pulmonary:     Effort: Pulmonary effort is normal. No respiratory distress.  Skin:    General: Skin is warm and dry.  Neurological:     Mental Status: He is alert and oriented to person, place, and time.  Psychiatric:        Mood and Affect: Mood normal.        Behavior: Behavior normal.     Lab Results:  Recent Labs    11/18/21 0553 11/19/21 0332  WBC 7.4 6.9  HGB 9.7* 8.8*  HCT 29.4* 27.5*  PLT 176 149*   BMET Recent Labs    11/18/21 0553 11/19/21 0332  NA 138 138  K 3.8 3.9  CL 114* 115*  CO2 19* 18*  GLUCOSE 92 99  BUN 17 16  CREATININE 1.15 1.12  CALCIUM 7.7* 7.6*   Assessment & Plan: Morbid 88 year old male with BPH and chronic Foley dependent  urinary retention with multiple hospitalizations for gross hematuria requiring repeat transfusions, now s/p HOLEP with Dr. Diamantina Providence 5 days ago.  His gross hematuria recurred overnight after resuming Eliquis yesterday.  Hemoglobin has dropped a little bit.  On exam, his urine appears light red and clear with no evidence of clots.  No indication for urgent urologic intervention at this time.  Please hold Eliquis.  We recommend a conservative approach for resuming anticoagulation, holding at least 2 weeks after HOLEP before resuming.  Debroah Loop, PA-C 11/19/2021

## 2021-11-19 NOTE — Progress Notes (Signed)
Physical Therapy Treatment Patient Details Name: Jacob Avila MRN: [id number] DOB: 1927-03-10 Today's Date: 11/19/2021   History of Present Illness presented to ER secondary to hematuria, seizure-like activity and AMS; admitted for management of acute metabolic encephalopathy and malpositioned foley (with clot burden), s/p HoLEP (11/13/21).  Hospital course also complicated by rapid afib and post-op blood-loss anemia.    PT Comments    Patient remains very motivated to participate with PT and is eager to get discharged to rehab. He continues to require physical assistance with all mobility. 3 bouts of standing performed from bed with cues for technique. External support required to maintain standing balance initially with standing tolerance of no more than 2 minutes at a time. He was able to take side steps with steadying assistance provided, cues for technique. Recommend to continue PT to maximize independence and decrease caregiver burden. SNF recommended at discharge.    Recommendations for follow up therapy are one component of a multi-disciplinary discharge planning process, led by the attending physician.  Recommendations may be updated based on patient status, additional functional criteria and insurance authorization.  Follow Up Recommendations  Skilled nursing-short term rehab (<3 hours/day) Can patient physically be transported by private vehicle: Yes   Assistance Recommended at Discharge Frequent or constant Supervision/Assistance  Patient can return home with the following A lot of help with walking and/or transfers;A lot of help with bathing/dressing/bathroom   Equipment Recommendations       Recommendations for Other Services       Precautions / Restrictions Precautions Precautions: Fall Restrictions Weight Bearing Restrictions: No     Mobility  Bed Mobility Overal bed mobility: Needs Assistance Bed Mobility: Supine to Sit, Sit to Supine     Supine to sit:  Min assist, Mod assist Sit to supine: Min assist   General bed mobility comments: assistance for LLE support. verbal cues for technique    Transfers Overall transfer level: Needs assistance Equipment used: Rolling walker (2 wheels) Transfers: Sit to/from Stand Sit to Stand: Max assist           General transfer comment: assistance for lifting required to stand. patient is able to stand with moderate assistance with the bed height elevated but needs maximal assistance with the bed height in lowest position. increased time and effort requried with cues for technique. 3 bouts of standing performed    Ambulation/Gait Ambulation/Gait assistance: Min assist Gait Distance (Feet): 5 Feet Assistive device: Rolling walker (2 wheels)         General Gait Details: patient able to take several side steps to left and right with steadying assistance provided. activity tolerance limited by fatigue. noted increased urine output with standing (bloody)   Stairs             Wheelchair Mobility    Modified Rankin (Stroke Patients Only)       Balance Overall balance assessment: Needs assistance Sitting-balance support: No upper extremity supported, Feet supported Sitting balance-Leahy Scale: Good     Standing balance support: Bilateral upper extremity supported, Reliant on assistive device for balance Standing balance-Leahy Scale: Poor Standing balance comment: external support required. standing tolerance of no more than 2 minutes at at time                            Cognition Arousal/Alertness: Awake/alert Behavior During Therapy: [facility] for tasks assessed/performed Overall Cognitive Status: Within Functional Limits for tasks assessed  Exercises      General Comments General comments (skin integrity, edema, etc.): patient is eager to be discharged to rehab      Pertinent Vitals/Pain Pain  Assessment Pain Assessment: No/denies pain    Home Living                          Prior Function            PT Goals (current goals can now be found in the care plan section) Acute Rehab PT Goals Patient Stated Goal: to get to rehab PT Goal Formulation: With patient Time For Goal Achievement: 11/29/21 Potential to Achieve Goals: Good Progress towards PT goals: Progressing toward goals    Frequency    Min 2X/week      PT Plan Current plan remains appropriate    Co-evaluation              AM-PAC PT "6 Clicks" Mobility   Outcome Measure  Help needed turning from your back to your side while in a flat bed without using bedrails?: A Little Help needed moving from lying on your back to sitting on the side of a flat bed without using bedrails?: A Little Help needed moving to and from a bed to a chair (including a wheelchair)?: A Lot Help needed standing up from a chair using your arms (e.g., wheelchair or bedside chair)?: A Lot Help needed to walk in hospital room?: A Lot Help needed climbing 3-5 steps with a railing? : A Lot 6 Click Score: 14    End of Session Equipment Utilized During Treatment: Gait belt Activity Tolerance: Patient tolerated treatment well;Patient limited by fatigue Patient left: in bed;with call bell/phone within reach;with bed alarm set   PT Visit Diagnosis: Muscle weakness (generalized) (M62.81);Difficulty in walking, not elsewhere classified (R26.2)     Time: 0017-4944 PT Time Calculation (min) (ACUTE ONLY): 23 min  Charges:  $Therapeutic Activity: 23-37 mins                     Minna Merritts, PT, MPT    Percell Locus 11/19/2021, 11:17 AM

## 2021-11-19 NOTE — Plan of Care (Signed)
  Problem: Education: Goal: Knowledge of General Education information will improve Description Including pain rating scale, medication(s)/side effects and non-pharmacologic comfort measures Outcome: Progressing   Problem: Health Behavior/Discharge Planning: Goal: Ability to manage health-related needs will improve Outcome: Progressing   

## 2021-11-19 NOTE — Telephone Encounter (Signed)
-----   Message from Billey Co, MD sent at 11/18/2021  8:09 AM EST ----- Good news, no prostate cancer on HOLEP tissue, follow up with me in 8 weeks PVR. (Daughter is contact person)  Nickolas Madrid, MD 11/18/2021

## 2021-11-20 ENCOUNTER — Encounter: Payer: Self-pay | Admitting: Urology

## 2021-11-20 DIAGNOSIS — R569 Unspecified convulsions: Secondary | ICD-10-CM | POA: Diagnosis not present

## 2021-11-20 LAB — CBC
HCT: 27.1 % — ABNORMAL LOW (ref 39.0–52.0)
Hemoglobin: 8.9 g/dL — ABNORMAL LOW (ref 13.0–17.0)
MCH: 25.6 pg — ABNORMAL LOW (ref 26.0–34.0)
MCHC: 32.8 g/dL (ref 30.0–36.0)
MCV: 77.9 fL — ABNORMAL LOW (ref 80.0–100.0)
Platelets: 188 10*3/uL (ref 150–400)
RBC: 3.48 MIL/uL — ABNORMAL LOW (ref 4.22–5.81)
RDW: 17.2 % — ABNORMAL HIGH (ref 11.5–15.5)
WBC: 6.9 10*3/uL (ref 4.0–10.5)
nRBC: 0 % (ref 0.0–0.2)

## 2021-11-20 LAB — GLUCOSE, CAPILLARY: Glucose-Capillary: 84 mg/dL (ref 70–99)

## 2021-11-20 MED ORDER — LEVETIRACETAM 500 MG PO TABS: 500.0000 mg | ORAL_TABLET | Freq: Two times a day (BID) | ORAL | 1 refills | Status: DC

## 2021-11-20 NOTE — Discharge Summary (Addendum)
Physician Discharge Summary   Patient: Jacob Avila MRN: [id number] DOB: 1927/09/03  Admit date:     11/12/2021  Discharge date: 11/20/21  Discharge Physician: Jacob Avila   PCP: Jacob Billet, MD   Recommendations at discharge:   follow-up urology on 30th November at 2:15 pm follow-up PCP in 1 to 2 week  Discharge Diagnoses: Principal Problem:   Seizure [facility]) Active Problems:   Acute metabolic encephalopathy   Hematuria   BPH (benign prostatic hyperplasia)   Acute blood loss anemia   Atrial fibrillation, chronic (HCC)   Chronic systolic CHF (congestive heart failure) (HCC)   HTN (hypertension)   HLD (hyperlipidemia)   Depression   Chronic indwelling Foley catheter   Hospital Course:  88 y.o. male with medical history significant of hypertension, hyperlipidemia, depression, atrial fibrillation on Eliquis, systolic CHF with a EF of 40-45%, indwelling Foley catheter due to BPH (last changed 1 week prior), CKD-3a, who presented to the emergency department on 11/8 with hematuria, seizure, altered mental status.  According to patient's daughter, patient had 2 episodes of seizure-like activity on day of presentation to the ED.    Routine EEG negative and MRI unremarkable.  By 11/9, patient much more awake and alert.     Hematuria CT showed significant clot burden in the bladder and Foley catheter within the prostatic urethra.   Status post HOLEP.  Hemoglobin actually increased from previous day.  -- Discussed with urology Dr. Diamantina Avila recommends hold eliquis for two weeks since patient is having mild hematuria. Hemoglobin stable. -- Patient is expected to have radiation change urine for next few days per urology.   BPH -resume home meds   Acute blood loss anemia Baseline hemoglobin around 12 and on admission at 10.  Has been trending downward and by 11/10 down to 5.9.  Transfused 3 units packed red blood cells.   -hgb stable, currently at 8.9   Seizure-like  activity Neurology consulted.  CT and spot EEG negative. MRI Brain negative  On po Keppra plus as needed Ativan.  Seizure precautions.  We will plan to DC on Keppra as per neurology.   Acute metabolic encephalopathy -appears to be back at baseline, suspect that he does have some mild underlying dementia as he is a little forgetful    Chronic systolic CHF (congestive heart failure) ([place])  2D echo on 06/09/2018 showed EF of 40-45%.     Atrial fibrillation, chronic (HCC) -Hold Eliquis due to hematuria Resume oral metoprolol, amiodarone    HTN (hypertension) -BP improved   HLD (hyperlipidemia)  Lipitor   Depression - Remeron   AKI in the setting of stage IIIa chronic kidney disease: Secondary to acute blood loss anemia.   -resolved and stable   DVT prophylaxis: SCD Code Status: DNR Family Communication: Updated daughter Jacob Avila by phone.  Patient currently is best at baseline. Discussed with urology okay to discharge. [facility]. Daughter in agreement.     Patient will be followed by outpatient palliative care. Consultants: neurology, urology Procedures performed: s/p HOLEP  Disposition: Skilled nursing facility Diet recommendation:  Discharge Diet Orders (From admission, onward)     Start     Ordered   11/20/21 0000  Diet - low sodium heart healthy        11/20/21 1025           Cardiac diet DISCHARGE MEDICATION: Allergies as of 11/20/2021   No Known Allergies      Medication List  STOP taking these medications    apixaban 5 MG Tabs tablet Commonly known as: ELIQUIS       TAKE these medications    amiodarone 200 MG tablet Commonly known as: PACERONE Take by mouth.   atorvastatin 80 MG tablet Commonly known as: LIPITOR Take by mouth.   finasteride 5 MG tablet Commonly known as: PROSCAR Take by mouth.   levETIRAcetam 500 MG tablet Commonly known as: KEPPRA Take 1 tablet (500 mg total) by mouth 2 (two)  times daily.   magnesium oxide 400 MG tablet Commonly known as: MAG-OX Take by mouth.   Melatonin 3 MG Tbdp Take by mouth.   metoprolol succinate 25 MG 24 hr tablet Commonly known as: TOPROL-XL Take 25 mg by mouth daily.   mirtazapine 7.5 MG tablet Commonly known as: REMERON Take by mouth.   nitroGLYCERIN 0.4 MG SL tablet Commonly known as: NITROSTAT Place 1 tablet under the tongue every 5 (five) minutes as needed.   senna-docusate 8.6-50 MG tablet Commonly known as: Senokot-S Take 2 tablets by mouth at bedtime.   tamsulosin 0.4 MG Caps capsule Commonly known as: FLOMAX Take by mouth.        Follow-up Information     Jacob Billet, MD. Schedule an appointment as soon as possible for a visit in 1 week(s).   Specialty: Internal Medicine Contact information: [address]   [place] [address] [phone]         Jacob Co, MD. Go on 12/04/2021.   Specialty: Urology Why: at 2:15 pm for Hematuria Contact information: [place] [address] [phone]                Discharge Exam: Jacob Avila Weights   11/12/21 0538 11/12/21 1836 11/13/21 1318  Weight: 80.5 kg 80.5 kg 80.5 kg     Condition at discharge: fair  The results of significant diagnostics from this hospitalization (including imaging, microbiology, ancillary and laboratory) are listed below for reference.   Imaging Studies: MR BRAIN W WO CONTRAST  Result Date: 11/15/2021 CLINICAL DATA:  Seizure EXAM: MRI HEAD WITHOUT AND WITH CONTRAST TECHNIQUE: Multiplanar, multiecho pulse sequences of the brain and surrounding structures were obtained without and with intravenous contrast. CONTRAST:  7.62m GADAVIST GADOBUTROL 1 MMOL/ML IV SOLN COMPARISON:  None Available. FINDINGS: Brain: No acute infarct, mass effect or extra-axial collection. No acute or chronic hemorrhage. There is confluent hyperintense T2-weighted signal within the white matter. Generalized volume  loss. the midline structures are normal. There is no abnormal contrast enhancement. Vascular: Major flow voids are preserved. Skull and upper cervical spine: Normal calvarium and skull base. Visualized upper cervical spine and soft tissues are normal. Sinuses/Orbits:No paranasal sinus fluid levels or advanced mucosal thickening. No mastoid or middle ear effusion. Normal orbits. IMPRESSION: 1. No acute intracranial abnormality. 2. Findings of chronic small vessel ischemia and volume loss. Electronically Signed   By: KUlyses JarredM.D.   On: 11/15/2021 19:18   EEG adult  Result Date: 11/13/2021 SDerek Jack MD     11/17/2021  7:36 AM Routine EEG Report Atzin MCOEN MIYASATOis a 88y.o. male with a history of seizure who is undergoing an EEG to evaluate for seizures. Report: This EEG was acquired with electrodes placed according to the International 10-20 electrode system (including Fp1, Fp2, F3, F4, C3, C4, P3, P4, O1, O2, T3, T4, T5, T6, A1, A2, Fz, Cz, Pz). The following electrodes were missing or displaced: none. The occipital  dominant rhythm was 6 Hz. This activity is reactive to stimulation. Drowsiness was manifested by background fragmentation; deeper stages of sleep were not identified. There was no focal slowing. There were no interictal epileptiform discharges. There were no electrographic seizures identified. There was no abnormal response to photic stimulation or hyperventilation. Impression and clinical correlation: This EEG was obtained while awake and drowsy and is abnormal due to moderate diffuse slowing indicative of global cerebral dysfunction. Epileptiform abnormalities were not seen during this recording. Su Monks, MD [facility] [phone] If 7pm- 7am, please page neurology on call as listed in [place].   US Pelvis Limited  Result Date: 11/12/2021 CLINICAL DATA:  Hematuria EXAM: LIMITED ULTRASOUND OF PELVIS TECHNIQUE: Limited transabdominal ultrasound examination of the  pelvis was performed. COMPARISON:  None Available. FINDINGS: Balloon of the Foley catheter is visualized within the bladder. Redemonstrated is heterogeneous layering material which is slightly echogenic given history of hematuria could represent blood products. Other findings:  None. IMPRESSION: Balloon of the Foley catheter is visualized within the bladder. Redemonstrated is heterogeneous layering material within the bladder. Given history of hematuria, this is favored to represent blood products. If there is concern for an underlying lesion, further evaluation with cystoscopy is recommended, when clinically appropriate. Electronically Signed   By: Marin Roberts M.D.   On: 11/12/2021 09:51   CT ABDOMEN PELVIS W CONTRAST  Result Date: 11/12/2021 CLINICAL DATA:  88 year old male with seizure-like activity, altered mental status, gross hematuria. Obstructing transverse colon mass in September. EXAM: CT ABDOMEN AND PELVIS WITH CONTRAST TECHNIQUE: Multidetector CT imaging of the abdomen and pelvis was performed using the standard protocol following bolus administration of intravenous contrast. RADIATION DOSE REDUCTION: This exam was performed according to the departmental dose-optimization program which includes automated exposure control, adjustment of the mA and/or kV according to patient size and/or use of iterative reconstruction technique. CONTRAST:  15m OMNIPAQUE IOHEXOL 300 MG/ML  SOLN COMPARISON:  Preoperative CT Abdomen and Pelvis 09/24/2021. CT Abdomen and Pelvis 12/26/2009. FINDINGS: Lower chest: Stable tortuous descending thoracic aorta with calcified atherosclerosis. Partially visible coronary artery calcified plaque or stents. No pericardial or pleural effusion. No significant cardiomegaly. Mild lung base scarring. Hepatobiliary: Small circumscribed low-density areas in the liver appear stable since September and too small to characterize but likely benign. Recommend routine cancer surveillance.  Absent gallbladder. Pancreas: Negative. Spleen: Negative. Adrenals/Urinary Tract: Chronic left adrenal lipid rich adenoma is unchanged since 2011 and benign (no follow-up imaging recommended). right adrenal gland is normal. Bilateral simple fluid density renal cysts are also chronic and benign (no follow-up imaging recommended). Symmetric renal contrast enhancement but new bilateral hydronephrosis and hydroureter. On delayed images there is no renal contrast excretion. Calcified renal artery atherosclerosis again noted. Abnormal urinary bladder is distended, contains fluid and gas, and also a relatively large 9.4 cm area of amorphous increased and mostly layering density. Malpositioned Foley catheter balloon is inflated in the prostatic urethra. Stomach/Bowel: New right abdominal colostomy and apparent resection of the abnormal transverse colon since September. There is a bowel staple line in the mid epigastrium now on series 2, image 17. Decompressed residual transverse colon in the left upper abdomen with decompressed descending colon, redundant sigmoid colon, and small volume retained stool in the rectum. Midline anterior abdominal wall postoperative changes are new along with right abdominal ostomy. No dilated large or small bowel. Small volume fluid and gas in the stomach and duodenum. No free air or free fluid identified. Vascular/Lymphatic: Aortoiliac calcified atherosclerosis. Major arterial structures in  the abdomen and pelvis are patent. Tortuous but nonaneurysmal abdominal aorta. Diminutive portal venous system with insufficient contrast on the initial images. On delayed images the portal venous system appears grossly patent. No lymphadenopathy identified. Reproductive: Marked prostatomegaly, and the Foley catheter balloon is inflated in the prostatic urethra today (series 2, image 74). Other: No pelvic free fluid. Musculoskeletal: Stable visualized osseous structures. Chronic spine degeneration. Chronic  pubic symphysis degeneration. No acute or suspicious osseous lesion identified. IMPRESSION: 1. Malpositioned Foley catheter with balloon inflated in the prostatic urethra, underlying marked prostatomegaly. Superimposed distended urinary bladder containing a large 9 cm amorphous layering density which is likely intravesical hematoma/hemorrhage. Subsequent bilateral hydronephrosis and hydroureter. 2. Abdominal surgery since September with apparent resection of the obstructing transverse colon mass. New right abdominal ostomy with no evidence of bowel obstruction or other adverse features. 3. No metastatic disease identified. Small indeterminate but probably benign liver lesions, recommend routine cancer surveillance imaging. 4.  Aortic Atherosclerosis (ICD10-I70.0). Electronically Signed   By: Genevie Ann M.D.   On: 11/12/2021 07:49   CT HEAD WO CONTRAST (5MM)  Result Date: 11/12/2021 CLINICAL DATA:  88 year old male with seizure-like activity, altered mental status, gross hematuria. EXAM: CT HEAD WITHOUT CONTRAST TECHNIQUE: Contiguous axial images were obtained from the base of the skull through the vertex without intravenous contrast. RADIATION DOSE REDUCTION: This exam was performed according to the departmental dose-optimization program which includes automated exposure control, adjustment of the mA and/or kV according to patient size and/or use of iterative reconstruction technique. COMPARISON:  Head CT 07/05/2018. FINDINGS: Brain: Cerebral volume has mildly decreased since 2020 but remains normal for age. No midline shift, ventriculomegaly, mass effect, evidence of mass lesion, intracranial hemorrhage or evidence of cortically based acute infarction. Patchy and confluent bilateral cerebral white matter hypodensity is stable but progressed, along with heterogeneity in the bilateral deep gray nuclei. No cortical encephalomalacia, brainstem and cerebellum appears stable and negative. Vascular: Calcified  atherosclerosis at the skull base. No suspicious intracranial vascular hyperdensity. Skull: Stable and intact. Sinuses/Orbits: New low-density sinus fluid level or retention cyst in the left maxillary sinus with underlying chronic periosteal thickening there. But other Visualized paranasal sinuses and mastoids are clear. Other: No orbit or scalp soft tissue injury identified. IMPRESSION: 1. No acute intracranial abnormality identified. 2. Evidence of progressed small vessel disease since 2020. 3. Mild acute on chronic left maxillary sinusitis. Electronically Signed   By: Genevie Ann M.D.   On: 11/12/2021 07:39    Microbiology: Results for orders placed or performed during the hospital encounter of 11/12/21  Culture, blood (Routine X 2) w Reflex to ID Panel     Status: None   Collection Time: 11/12/21  1:52 PM   Specimen: BLOOD  Result Value Ref Range Status   Specimen Description BLOOD LEFT ANTECUBITAL  Final   Special Requests   Final    BOTTLES DRAWN AEROBIC AND ANAEROBIC Blood Culture adequate volume   Culture   Final    NO GROWTH 5 DAYS Performed at [facility], [address], [place], [place] [address]    Report Status 11/17/2021 FINAL  Final  Culture, blood (Routine X 2) w Reflex to ID Panel     Status: None   Collection Time: 11/12/21  2:32 PM   Specimen: BLOOD  Result Value Ref Range Status   Specimen Description BLOOD RIGHT ANTECUBITAL  Final   Special Requests   Final    BOTTLES DRAWN AEROBIC AND ANAEROBIC Blood Culture results may not  be optimal due to an inadequate volume of blood received in culture bottles   Culture   Final    NO GROWTH 5 DAYS Performed at [facility], [address] [place], [place] [address]    Report Status 11/17/2021 FINAL  Final  MRSA Next Gen by PCR, Nasal     Status: None   Collection Time: 11/12/21  6:45 PM   Specimen: Nasal Mucosa; Nasal Swab  Result Value Ref Range Status   MRSA by PCR Next Gen NOT DETECTED NOT DETECTED  Final    Comment: (NOTE) The GeneXpert MRSA Assay (FDA approved for NASAL specimens only), is one component of a comprehensive MRSA colonization surveillance program. It is not intended to diagnose MRSA infection nor to guide or monitor treatment for MRSA infections. Test performance is not FDA approved in patients less than 9 years old. Performed at [facility], [address] [place], [place] [address]   Urine Culture     Status: None   Collection Time: 11/12/21 10:11 PM   Specimen: Urine, Random  Result Value Ref Range Status   Specimen Description   Final    URINE, RANDOM Performed at [facility], [address] [place], [place] [address]    Special Requests   Final    NONE Performed at [facility], [address], [place], [place] [address]    Culture   Final    NO GROWTH Performed at [facility], [place] [address] [place], [place] [address]    Report Status 11/14/2021 FINAL  Final    Labs: CBC: Recent Labs  Lab 11/14/21 0638 11/14/21 2010 11/16/21 0455 11/17/21 0424 11/18/21 0553 11/19/21 0332 11/20/21 0418  WBC 8.9  --  8.8 7.5 7.4 6.9 6.9  NEUTROABS 8.4*  --   --   --   --   --   --   HGB 5.9*   < > 8.6* 9.7* 9.7* 8.8* 8.9*  HCT 18.7*  --  26.2* 29.1* 29.4* 27.5* 27.1*  MCV 75.1*  --  77.1* 77.4* 76.6* 78.1* 77.9*  PLT 166  --  156 168 176 149* 188   < > = values in this interval not displayed.   Basic Metabolic Panel: Recent Labs  Lab 11/15/21 0234 11/16/21 0455 11/17/21 0424 11/18/21 0553 11/19/21 0332  NA 139 139 138 138 138  K 4.3 3.9 4.0 3.8 3.9  CL 120* 120* 115* 114* 115*  CO2 16* 18* 18* 19* 18*  GLUCOSE 132* 91 102* 92 99  BUN '23 21 20 17 16  '$ CREATININE 1.19 1.05 1.23 1.15 1.12  CALCIUM 6.9* 7.0* 7.8* 7.7* 7.6*   Liver Function Tests: No results for input(s): "AST", "ALT", "ALKPHOS", "BILITOT", "PROT", "ALBUMIN" in the last 168 hours. CBG: Recent Labs  Lab 11/18/21 2112 11/19/21 0821  11/19/21 1145 11/19/21 2109 11/20/21 0745  GLUCAP 123* 91 121* 109* 84    Discharge time spent: greater than 30 minutes.  Signed: Fritzi Mandes, MD [facility] 11/20/2021

## 2021-11-20 NOTE — TOC Transition Note (Signed)
Transition of Care [facility]) - CM/SW Discharge Note   Patient Details  Name: IBRAHIM MCPHEETERS MRN: [id number] Date of Birth: [birth year]  [facility] of [facility]) CM/SW Contact:  Laurena Slimmer, RN Phone Number: 11/20/2021, 11:21 AM   Clinical Narrative:    Spoke with Tammy in admissions at [facility]. Patient assigned room 711.  Nurse will call report to [phone] Discharge summary sent in the [place]. EMS packet arranged.  EMS scheduled  Patient daughter, [name] notified.  Nurse provided room number and room assignment.   TOC signing off.        Final next level of care: Skilled Nursing Facility Barriers to Discharge: Barriers Resolved   Patient Goals and CMS Choice Patient states their goals for this hospitalization and ongoing recovery are:: SNF CMS Medicare.gov Compare Post Acute Care list provided to:: Patient Choice offered to / list presented to : Patient  Discharge Placement              Patient chooses bed at: [facility] [place] Patient to be transferred to facility by: [facility] Name of family member notified: [name] Patient and family notified of of transfer: 11/20/21  Discharge Plan and Services                                     Social Determinants of Health (SDOH) Interventions     Readmission Risk Interventions     No data to display

## 2021-11-20 NOTE — TOC Progression Note (Signed)
Transition of Care [facility]) - Progression Note    Patient Details  Name: Jacob Avila MRN: [id number] Date of Birth: 1927/09/16  [facility]) CM/SW Contact  Laurena Slimmer, RN Phone Number: 11/20/2021, 9:48 AM  Clinical Narrative:    Spoke with Tammy at [facility] @ [phone].  Patient approved for [facility]. Approval # [id number]. Patient will start on his 16th day. Day 21 will be co-payment days.   Approved for Ambulance [phone] for 1 time use through 10 days   MD notified.    Expected Discharge Plan: [place] Barriers to Discharge: Continued Medical Work up  Expected Discharge Plan and Services Expected Discharge Plan: [place] arrangements for the past 2 months: Single Family Home                                       Social Determinants of Health (SDOH) Interventions    Readmission Risk Interventions     No data to display

## 2021-11-20 NOTE — Discharge Instructions (Signed)
Pt is expected to have reddish pink urine for an few days. If he starts having bright red blood or large clots send him to ER

## 2021-11-20 NOTE — Progress Notes (Addendum)
1219 Urine still dark red in color. No clots noted, pt declines pain. Dr Posey Pronto made aware and states this well take a couple days to clear and [facility] facility will monitor as well. Pt still ok for d/c, eliquis remain stopped.   1227 Report given to Rachael ([facility]) All questions and concerns answered. IV removed. Pt waiting EMS transport   [phone] [facility]

## 2021-11-20 NOTE — Care Management Important Message (Signed)
Important Message  Patient Details  Name: Jacob Avila MRN: [id number] Date of Birth: 27-Nov-1927   Medicare Important Message Given:  Yes     Juliann Pulse A Keagon Glascoe 11/20/2021, 10:46 AM

## 2021-11-20 NOTE — Plan of Care (Signed)
  Problem: Education: Goal: Knowledge of General Education information will improve Description: Including pain rating scale, medication(s)/side effects and non-pharmacologic comfort measures Outcome: Progressing   Problem: Activity: Goal: Risk for activity intolerance will decrease Outcome: Progressing   Problem: Pain Managment: Goal: General experience of comfort will improve Outcome: Progressing   

## 2021-11-24 ENCOUNTER — Telehealth: Payer: Self-pay | Admitting: Family Medicine

## 2021-11-24 ENCOUNTER — Other Ambulatory Visit: Payer: Self-pay

## 2021-11-24 DIAGNOSIS — R310 Gross hematuria: Secondary | ICD-10-CM

## 2021-11-24 NOTE — Telephone Encounter (Addendum)
Patient's daughter called and states patient hasn't urinated in 2 days. He passed 1 big blood clot yesterday. She said he is not complaining of lower abdominal pain. She states after surgery he was put back on blood thinners and had to be taken back off because he was passing so much blood. He has not been back on them since last week. I told her there was a opening in [place] today. She is calling back to let us know if he can go to [facility].

## 2021-11-24 NOTE — Telephone Encounter (Signed)
I called [facility] and spoke to Trish the shift supervisor to find out how long it has been since he urinated. She states patient has been urinating fine the last couple days. He had a small blood clot on Saturday and since then his diaper has been getting wet and he has had output in a urinal several times. She states there is dark blood in the urine but she thinks this is normal post-op. Trish informed me that he seems to be doing fine with urination and they are not concerned. She states his daughter is concerned and has scheduled a follow up appointment  with Dr. Diamantina Providence in [facility] for Tuesday. I asked her to please send the notes from the last 72 hours so Dr. Diamantina Providence can see what has been going on at the home.

## 2021-11-24 NOTE — Telephone Encounter (Signed)
Daughter called back to let us know they can take him for appt w/[facility] in [facility] tomorrow.  They are unable to take him today.

## 2021-11-25 ENCOUNTER — Ambulatory Visit: Payer: PPO | Admitting: Urology

## 2021-11-25 ENCOUNTER — Encounter: Payer: Self-pay | Admitting: Urology

## 2021-11-25 VITALS — BP 105/71 | HR 70

## 2021-11-25 DIAGNOSIS — R310 Gross hematuria: Secondary | ICD-10-CM | POA: Diagnosis not present

## 2021-11-25 LAB — BLADDER SCAN AMB NON-IMAGING

## 2021-11-25 NOTE — Progress Notes (Signed)
   11/25/2021 10:42 AM   Dewayne Jaclyn Prime 04-10-1927 [id number]  Reason for visit: BPH, gross hematuria  HPI: 88 year old very comorbid male with history of BPH/retention/gross hematuria with clot retention and 200+ gram prostate who ultimately underwent an emergent HOLEP on 11/13/2021 after being admitted with gross hematuria and clot retention requiring blood transfusion, 107g of benign tissue were removed.  He was able to void spontaneously and was discharged.  He has had some intermittent hematuria at his facility, but continues to urinate and leak.  He had 1 episode of more significant hematuria yesterday morning with a small clot, but urine last night was clear yellow.  Bladder scan in clinic this morning 400 mL, but he denies any lower abdominal pain or urge to urinate, and has not yet voided this morning.  He has been holding his Xarelto.  Indication for Xarelto is A-fib.  His daughter had a number of questions about timing of resuming Xarelto.  I again recommended holding the Xarelto for at least 2 weeks postop.  Risks and benefits discussed again extensively between risk of stroke off anticoagulation, but risk of bleeding or need for repeat hospitalization if resuming the Xarelto too soon.  We reviewed options again today including continuing to monitor versus replacing a Foley today.  He would like to avoid Foley which I think is reasonable.  He has a long history of incomplete emptying with a large distended bladder, and likely needs to achieve a larger volume prior to voiding.  Okay to resume xarelto on 12/01/2021 if urine is clear Has follow-up scheduled 12/04/2021 for PVR  Billey Co, MD  [facility] [address] [place], [place] [address] [phone]

## 2021-12-04 ENCOUNTER — Ambulatory Visit: Payer: PPO | Admitting: Urology

## 2022-01-22 ENCOUNTER — Ambulatory Visit: Payer: Medicare Other | Admitting: Family Medicine

## 2022-01-28 ENCOUNTER — Ambulatory Visit: Payer: PPO | Admitting: Urology

## 2022-02-05 DEATH — deceased

## 2022-02-11 ENCOUNTER — Ambulatory Visit: Payer: PPO | Admitting: Urology
